# Patient Record
Sex: Male | Born: 1970 | Race: White | Hispanic: No | Marital: Single | Smoking: Current every day smoker
Health system: Southern US, Community
[De-identification: ages and names within clinical notes are randomized; demographics above are authoritative.]

## PROBLEM LIST (undated history)

## (undated) DIAGNOSIS — F329 Major depressive disorder, single episode, unspecified: Secondary | ICD-10-CM

## (undated) DIAGNOSIS — R748 Abnormal levels of other serum enzymes: Secondary | ICD-10-CM

## (undated) DIAGNOSIS — R569 Unspecified convulsions: Secondary | ICD-10-CM

## (undated) DIAGNOSIS — S73006A Unspecified dislocation of unspecified hip, initial encounter: Secondary | ICD-10-CM

## (undated) DIAGNOSIS — F32A Depression, unspecified: Secondary | ICD-10-CM

## (undated) DIAGNOSIS — F1010 Alcohol abuse, uncomplicated: Secondary | ICD-10-CM

## (undated) DIAGNOSIS — F419 Anxiety disorder, unspecified: Secondary | ICD-10-CM

## (undated) HISTORY — PX: FACIAL FRACTURE SURGERY: SHX1570

---

## 2000-07-17 ENCOUNTER — Inpatient Hospital Stay (HOSPITAL_COMMUNITY): Admission: AC | Admit: 2000-07-17 | Discharge: 2000-07-19 | Payer: Self-pay

## 2000-07-17 ENCOUNTER — Encounter: Payer: Self-pay | Admitting: Emergency Medicine

## 2002-07-19 ENCOUNTER — Emergency Department (HOSPITAL_COMMUNITY): Admission: AD | Admit: 2002-07-19 | Discharge: 2002-07-19 | Payer: Self-pay | Admitting: Emergency Medicine

## 2010-11-04 ENCOUNTER — Ambulatory Visit: Payer: Self-pay | Admitting: Internal Medicine

## 2010-11-04 DIAGNOSIS — Z0289 Encounter for other administrative examinations: Secondary | ICD-10-CM

## 2011-04-27 ENCOUNTER — Encounter (HOSPITAL_COMMUNITY): Payer: Self-pay | Admitting: *Deleted

## 2011-04-27 ENCOUNTER — Inpatient Hospital Stay (HOSPITAL_COMMUNITY)
Admission: EM | Admit: 2011-04-27 | Discharge: 2011-04-30 | DRG: 896 | Disposition: A | Discharge status: Home / Self Care | Payer: Self-pay | Attending: Internal Medicine | Admitting: Internal Medicine

## 2011-04-27 ENCOUNTER — Inpatient Hospital Stay (HOSPITAL_COMMUNITY): Payer: Self-pay

## 2011-04-27 DIAGNOSIS — R7401 Elevation of levels of liver transaminase levels: Secondary | ICD-10-CM | POA: Diagnosis present

## 2011-04-27 DIAGNOSIS — F10239 Alcohol dependence with withdrawal, unspecified: Principal | ICD-10-CM | POA: Diagnosis present

## 2011-04-27 DIAGNOSIS — T510X4A Toxic effect of ethanol, undetermined, initial encounter: Secondary | ICD-10-CM | POA: Diagnosis present

## 2011-04-27 DIAGNOSIS — G92 Toxic encephalopathy: Secondary | ICD-10-CM

## 2011-04-27 DIAGNOSIS — F29 Unspecified psychosis not due to a substance or known physiological condition: Secondary | ICD-10-CM | POA: Diagnosis present

## 2011-04-27 DIAGNOSIS — R4182 Altered mental status, unspecified: Secondary | ICD-10-CM | POA: Diagnosis present

## 2011-04-27 DIAGNOSIS — E871 Hypo-osmolality and hyponatremia: Secondary | ICD-10-CM | POA: Diagnosis present

## 2011-04-27 DIAGNOSIS — F1210 Cannabis abuse, uncomplicated: Secondary | ICD-10-CM | POA: Diagnosis present

## 2011-04-27 DIAGNOSIS — R7402 Elevation of levels of lactic acid dehydrogenase (LDH): Secondary | ICD-10-CM | POA: Diagnosis present

## 2011-04-27 DIAGNOSIS — D72829 Elevated white blood cell count, unspecified: Secondary | ICD-10-CM | POA: Diagnosis present

## 2011-04-27 DIAGNOSIS — F1020 Alcohol dependence, uncomplicated: Secondary | ICD-10-CM | POA: Diagnosis present

## 2011-04-27 DIAGNOSIS — D696 Thrombocytopenia, unspecified: Secondary | ICD-10-CM | POA: Diagnosis present

## 2011-04-27 DIAGNOSIS — R17 Unspecified jaundice: Secondary | ICD-10-CM

## 2011-04-27 DIAGNOSIS — T65891A Toxic effect of other specified substances, accidental (unintentional), initial encounter: Secondary | ICD-10-CM | POA: Diagnosis present

## 2011-04-27 DIAGNOSIS — F10939 Alcohol use, unspecified with withdrawal, unspecified: Principal | ICD-10-CM | POA: Diagnosis present

## 2011-04-27 DIAGNOSIS — F10231 Alcohol dependence with withdrawal delirium: Secondary | ICD-10-CM

## 2011-04-27 DIAGNOSIS — G929 Unspecified toxic encephalopathy: Secondary | ICD-10-CM | POA: Diagnosis present

## 2011-04-27 DIAGNOSIS — F17200 Nicotine dependence, unspecified, uncomplicated: Secondary | ICD-10-CM | POA: Diagnosis present

## 2011-04-27 HISTORY — DX: Unspecified convulsions: R56.9

## 2011-04-27 HISTORY — DX: Unspecified dislocation of unspecified hip, initial encounter: S73.006A

## 2011-04-27 HISTORY — DX: Depression, unspecified: F32.A

## 2011-04-27 HISTORY — DX: Alcohol abuse, uncomplicated: F10.10

## 2011-04-27 HISTORY — DX: Major depressive disorder, single episode, unspecified: F32.9

## 2011-04-27 HISTORY — DX: Anxiety disorder, unspecified: F41.9

## 2011-04-27 LAB — CBC
HCT: 40.1 % (ref 39.0–52.0)
MCHC: 35.4 g/dL (ref 30.0–36.0)
Platelets: 143 10*3/uL — ABNORMAL LOW (ref 150–400)
RDW: 12.6 % (ref 11.5–15.5)
WBC: 16.1 10*3/uL — ABNORMAL HIGH (ref 4.0–10.5)

## 2011-04-27 LAB — URINE MICROSCOPIC-ADD ON

## 2011-04-27 LAB — URINALYSIS, ROUTINE W REFLEX MICROSCOPIC
Glucose, UA: NEGATIVE mg/dL
Hgb urine dipstick: NEGATIVE
Ketones, ur: 15 mg/dL — AB
Protein, ur: 30 mg/dL — AB

## 2011-04-27 LAB — DIFFERENTIAL
Basophils Absolute: 0 10*3/uL (ref 0.0–0.1)
Eosinophils Absolute: 0 10*3/uL (ref 0.0–0.7)
Lymphocytes Relative: 8 % — ABNORMAL LOW (ref 12–46)
Monocytes Relative: 8 % (ref 3–12)
Neutro Abs: 13.5 10*3/uL — ABNORMAL HIGH (ref 1.7–7.7)

## 2011-04-27 LAB — COMPREHENSIVE METABOLIC PANEL
AST: 90 U/L — ABNORMAL HIGH (ref 0–37)
Albumin: 4.4 g/dL (ref 3.5–5.2)
Alkaline Phosphatase: 114 U/L (ref 39–117)
BUN: 9 mg/dL (ref 6–23)
Creatinine, Ser: 0.75 mg/dL (ref 0.50–1.35)
Potassium: 3.5 mEq/L (ref 3.5–5.1)
Total Protein: 8 g/dL (ref 6.0–8.3)

## 2011-04-27 LAB — RAPID URINE DRUG SCREEN, HOSP PERFORMED
Amphetamines: NOT DETECTED
Benzodiazepines: NOT DETECTED
Opiates: NOT DETECTED
Tetrahydrocannabinol: POSITIVE — AB

## 2011-04-27 LAB — ETHANOL: Alcohol, Ethyl (B): <11 mg/dL (ref 0–11)

## 2011-04-27 MED ORDER — LORAZEPAM 1 MG PO TABS: 1.0000 mg | ORAL_TABLET | Freq: Four times a day (QID) | ORAL | Status: DC | PRN

## 2011-04-27 MED ORDER — LORAZEPAM 2 MG/ML IJ SOLN
2.0000 mg | Freq: Once | INTRAMUSCULAR | Status: AC
  Administered 2011-04-27: 2 mg via INTRAMUSCULAR
  Filled 2011-04-27: qty 1

## 2011-04-27 MED ORDER — SODIUM CHLORIDE 0.9 % IJ SOLN
3.0000 mL | Freq: Two times a day (BID) | INTRAMUSCULAR | Status: DC
  Administered 2011-04-28 – 2011-04-30 (×5): 3 mL via INTRAVENOUS

## 2011-04-27 MED ORDER — THIAMINE HCL 100 MG/ML IJ SOLN
100.0000 mg | Freq: Every day | INTRAMUSCULAR | Status: DC
  Filled 2011-04-27 (×3): qty 1

## 2011-04-27 MED ORDER — ONDANSETRON HCL 4 MG/2ML IJ SOLN: 4.0000 mg | Freq: Four times a day (QID) | INTRAMUSCULAR | Status: DC | PRN

## 2011-04-27 MED ORDER — ZIPRASIDONE MESYLATE 20 MG IM SOLR
20.0000 mg | Freq: Once | INTRAMUSCULAR | Status: AC
  Administered 2011-04-27: 20 mg via INTRAMUSCULAR
  Filled 2011-04-27: qty 20

## 2011-04-27 MED ORDER — ADULT MULTIVITAMIN W/MINERALS CH
1.0000 | ORAL_TABLET | Freq: Every day | ORAL | Status: DC
  Administered 2011-04-28 – 2011-04-30 (×3): 1 via ORAL
  Filled 2011-04-27 (×3): qty 1

## 2011-04-27 MED ORDER — LORAZEPAM 2 MG/ML IJ SOLN
0.0000 mg | Freq: Four times a day (QID) | INTRAMUSCULAR | Status: AC
Start: 2011-04-27 — End: 2011-04-29
  Administered 2011-04-28: 2 mg via INTRAVENOUS
  Administered 2011-04-28: 1 mg via INTRAVENOUS
  Administered 2011-04-29: 2 mg via INTRAVENOUS
  Filled 2011-04-27 (×3): qty 1

## 2011-04-27 MED ORDER — ACETAMINOPHEN 325 MG PO TABS: 650.0000 mg | ORAL_TABLET | Freq: Four times a day (QID) | ORAL | Status: DC | PRN

## 2011-04-27 MED ORDER — THIAMINE HCL 100 MG/ML IJ SOLN
Freq: Once | INTRAVENOUS | Status: AC
  Administered 2011-04-27: 19:00:00 via INTRAVENOUS
  Filled 2011-04-27: qty 1000

## 2011-04-27 MED ORDER — LORAZEPAM 1 MG PO TABS: 2.0000 mg | ORAL_TABLET | Freq: Once | ORAL | Status: DC

## 2011-04-27 MED ORDER — HALOPERIDOL LACTATE 5 MG/ML IJ SOLN: 5.0000 mg | Freq: Four times a day (QID) | INTRAMUSCULAR | Status: DC | PRN

## 2011-04-27 MED ORDER — VITAMIN B-1 100 MG PO TABS
100.0000 mg | ORAL_TABLET | Freq: Every day | ORAL | Status: DC
  Administered 2011-04-28 – 2011-04-30 (×3): 100 mg via ORAL
  Filled 2011-04-27 (×3): qty 1

## 2011-04-27 MED ORDER — LORAZEPAM 2 MG/ML IJ SOLN
2.0000 mg | Freq: Once | INTRAMUSCULAR | Status: AC
  Administered 2011-04-27: 2 mg via INTRAVENOUS
  Filled 2011-04-27: qty 1

## 2011-04-27 MED ORDER — M.V.I. ADULT IV INJ
INJECTION | Freq: Once | INTRAVENOUS | Status: AC
  Administered 2011-04-27: 16:00:00 via INTRAVENOUS
  Filled 2011-04-27: qty 1000

## 2011-04-27 MED ORDER — ONDANSETRON HCL 4 MG PO TABS: 4.0000 mg | ORAL_TABLET | Freq: Four times a day (QID) | ORAL | Status: DC | PRN

## 2011-04-27 MED ORDER — ACETAMINOPHEN 650 MG RE SUPP: 650.0000 mg | Freq: Four times a day (QID) | RECTAL | Status: DC | PRN

## 2011-04-27 MED ORDER — LORAZEPAM 2 MG/ML IJ SOLN
0.0000 mg | Freq: Two times a day (BID) | INTRAMUSCULAR | Status: DC
  Administered 2011-04-30: 2 mg via INTRAVENOUS
  Filled 2011-04-27: qty 1

## 2011-04-27 MED ORDER — LORAZEPAM 2 MG/ML IJ SOLN
1.0000 mg | Freq: Four times a day (QID) | INTRAMUSCULAR | Status: DC | PRN
  Administered 2011-04-29: 1 mg via INTRAVENOUS
  Filled 2011-04-27: qty 1

## 2011-04-27 MED ORDER — FOLIC ACID 1 MG PO TABS
1.0000 mg | ORAL_TABLET | Freq: Every day | ORAL | Status: DC
  Administered 2011-04-28 – 2011-04-30 (×3): 1 mg via ORAL
  Filled 2011-04-27 (×3): qty 1

## 2011-04-27 NOTE — ED Notes (Signed)
Per EMS. Pt had last drink at 2AM. Drinks a fifth of liquor a day. Pt was found in a dumpster. Pt was altered initially and paranoid. Pt is now alert and oriented. Pt said he was "running from the police." Police on scene and stated there are no warrants for patient.

## 2011-04-27 NOTE — H&P (Signed)
Hospital Admission Note Date: 04/27/2011  Patient name: Jake [name] Medical record number: [id number] Date of birth: [birth year] Age: 41 y.o. Gender: male PCP: No primary provider on file.  Attending physician: Maryruth Bun Wake Conlee, MD Emergency Contact: Christel Mormon [phone] Code Status: Full  Chief Complaint: Altered mental status in the setting of known alcohol abuse  History of Present Illness: Jake [name] is an 41 y.o. male the past medical history of alcoholism who, according to the emergency department room physicians notes, was brought to the hospital by the police for evaluation of altered mental status. He reportedly drinks a fifth of liquor a day as well as a case of beer. He apparently was found in a dumpster. His last alcoholic drink was at 2 AM. While being evaluated in emergency department, he became increasingly belligerent and combative and has now been sedated with Geodon and is completely unable to wake up to answer any questions for me. Attempts to contact his emergency contact have been unsuccessful. He was referred for medical clearance due to his alcoholism and need for detoxification, hyponatremia, and leukocytosis.  Past Medical History Past Medical History  Diagnosis Date  . ETOH abuse   . Seizures   . Hip dislocation   . Depression   . Anxiety     Past Surgical History History reviewed. No pertinent past surgical history.  Meds: Prior to Admission medications   Not on File    Allergies: Review of patient's allergies indicates no known allergies.  Social History: History   Social History  . Marital Status: Married    Spouse Name: N/A    Number of Children: N/A  . Years of Education: N/A   Occupational History  . Not on file.   Social History Main Topics  . Smoking status: Current Everyday Smoker  . Smokeless tobacco: Never Used  . Alcohol Use: Yes  . Drug Use: Yes    Special: Marijuana  . Sexually Active:    Other Topics  Concern  . Not on file   Social History Narrative  . No narrative on file    Family History:  No family history on file.  Review of Systems: Unable to obtain a review of systems at this time.   Physical Exam: Blood pressure 137/77, pulse 118, temperature 98.3 F (36.8 C), temperature source Oral, resp. rate 20, SpO2 96.00%. BP 137/77  Pulse 118  Temp(Src) 98.3 F (36.8 C) (Oral)  Resp 20  SpO2 96%  General Appearance:    lethargic with snoring respirations   Head:    Normocephalic, without obvious abnormality, atraumatic  Eyes:    PERRL, conjunctiva/corneas clear, unable to assess extraocular movements      Ears:    Normal external ear canals, both ears  Nose:   Nares normal, septum midline, mucosa normal, no drainage    or sinus tenderness  Throat:   Would not open his mouth for me to examine his gums or teeth.  Neck:   Supple, symmetrical, trachea midline, no adenopathy;       thyroid:  No enlargement/tenderness/nodules; no carotid   bruit or JVD  Lungs:     Clear to auscultation bilaterally, respirations unlabored with snoring type respirations.  Chest wall:    No tenderness or deformity  Heart:    Regular rate and rhythm, S1 and S2 normal, no murmur, rub   or gallop  Abdomen:     Soft, non-tender, bowel sounds active all four quadrants,    no masses,  no organomegaly  Extremities:   Extremities normal, atraumatic, no cyanosis or edema  Pulses:   2+ and symmetric all extremities  Skin:   Skin color, texture, turgor normal, no rashes or lesions  Neurologic:   Not cooperative with neurological exam. Spontaneously moves all extremities.    Lab results: Basic Metabolic Panel:  Lab 04/27/11 1610  NA 127*  K 3.5  CL 86*  CO2 23  GLUCOSE 75  BUN 9  CREATININE 0.75  CALCIUM 9.8  MG --  PHOS --   GFR CrCl is unknown because there is no height on file for the current visit. Liver Function Tests:  Lab 04/27/11 1410  AST 90*  ALT 53  ALKPHOS 114  BILITOT 1.3*    PROT 8.0  ALBUMIN 4.4    CBC:  Lab 04/27/11 1410  WBC 16.1*  NEUTROABS 13.5*  HGB 14.2  HCT 40.1  MCV 93.5  PLT 143*   Imaging results:  No results found.  Assessment & Plan: Principal Problem:  *Toxic encephalopathy / Alcoholism / Psychosis  Altered mental status a combination of alcohol withdrawal, delirium, effects of Geodon.  High-risk for delirium tremens given the degree of alcohol consumption.  Check urine drug screen.  Admit to step down unit due to high-risk of seizures, delirium tremens, and other sequela of alcohol withdrawal.  Ativan IV per [facility] detox protocol.  Neuro checks every 4 hours.  May also need antipsychotics.  Psychiatry evaluation   Supplement thiamine and folic acid.  Active problems:  Leukocytosis  Likely demargination from stress response.  Check urinalysis  Check portable chest x-ray.  Hyponatremia  Likely due to beer drinker's potomania  Gently hydrate.  Thrombocytopenia  Likely secondary to the toxic effects of alcohol on the bone marrow and liver disease.  Mild with no evidence of bleeding.  Elevated AST (SGOT) / Elevated bilirubin  Likely from alcohol-induced hepatitis.  Will monitor.  Prophylaxis: PAS hoses.  Time Spent On Admission: 45 minutes  Leena Tiede 04/27/2011, 3:46 PM Pager [phone]

## 2011-04-27 NOTE — ED Notes (Signed)
Attempted to call report, but receiving RN was transferring a patient at the time and will call back.

## 2011-04-27 NOTE — ED Notes (Signed)
Pt continuously attempting to get out of bed, pulling at lines and trying to take clothes off. He is talking constant nonsense. He is not aggressive but does not follow instructions. At the present time, he is fairly easy to redirect, but this intervention may not hold up. Currently, 1 [employer], 1 tech and 3 security personnel present in room.

## 2011-04-27 NOTE — BH Assessment (Signed)
Assessment Note   Jake [name] is an 41 y.o. male who presents in [facility] Emergency Department with the chief complaint of alcohol abuse although patient exhibits severe psychotic features. During assessment patient was unable to coherently provide any information as to what events led him to the hospital for evaluation. Per MD initial contact note, patient reported that he was made to come to the ED by the police this morning for evaluation after he was caught fishing without a license. Patient exhibits pressured speech, tangential thinking, and inability to coherently respond to questions posed by Clinical research associate. Patient continued to state "I wanna take peanut butter...smear it on bottoms.Marland KitchenMarland KitchenChanetta Marshall get out of there! What are these things???" During initial conversation with MD, patient was observed to be actively hallucinating, speaking to a fire extinguisher that he says is a guy with mickey mouse ears on. Per chart, patient has a noted history of alcohol abuse and desire for detox. This writer recommends inpatient psychiatric treatment at this time due to active hallucinations and psychotic symptoms exhibited by patient.    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 ETOH abuse   . Seizures   . Hip dislocation   . Depression   . Anxiety    Axis IV: other psychosocial or environmental problems Axis V: 21-30 behavior considerably influenced by delusions or hallucinations OR serious impairment in judgment, communication OR inability to function in almost all areas  Past Medical History:  Past Medical History  Diagnosis Date  . ETOH abuse   . Seizures   . Hip dislocation   . Depression   . Anxiety     History reviewed. No pertinent past surgical history.  Family History: No family history on file.  Social History:  reports that he has been smoking.  He has never used smokeless tobacco. He reports that he drinks  alcohol. He reports that he uses illicit drugs (Marijuana).  Additional Social History:  Alcohol / Drug Use Pain Medications: Unknown  Prescriptions: Unknown Over the Counter: Unknown  History of alcohol / drug use?:  (Patient unable to provide SA history) Longest period of sobriety (when/how long): Unknown Allergies: No Known Allergies  Home Medications:  Medications Prior to Admission  Medication Dose Route Frequency Provider Last Rate Last Dose  . LORazepam (ATIVAN) injection 2 mg  2 mg Intramuscular Once Rise Patience, PA   2 mg at 04/27/11 1321  . LORazepam (ATIVAN) injection 2 mg  2 mg Intravenous Once Rise Patience, PA   2 mg at 04/27/11 1429  . ziprasidone (GEODON) injection 20 mg  20 mg Intramuscular Once Geoffery Lyons, MD   20 mg at 04/27/11 1502  . DISCONTD: LORazepam (ATIVAN) tablet 2 mg  2 mg Oral Once Rise Patience, PA       No current outpatient prescriptions on file as of 04/27/2011.    OB/GYN Status:  No LMP for male patient.  General Assessment Data Location of Assessment: [facility] ED Living Arrangements: Other (Comment) (Unknown ) Can pt return to current living arrangement?: Yes Admission Status: Voluntary Is patient capable of signing voluntary admission?: No Transfer from: Acute Hospital Referral Source: MD  Education Status Is patient currently in school?: No  Risk to self Suicidal Ideation: No Suicidal Intent: No Is patient at risk for suicide?: No Suicidal Plan?: No Access to Means: Yes Specify Access to Suicidal Means: Access to streets and objects What  has been your use of drugs/alcohol within the last 12 months?: Unknown  (Pt is unable to coherently provide SA information) Previous Attempts/Gestures:  (Unknown at this time) Other Self Harm Risks:  (Unknown at this time) Triggers for Past Attempts: Unknown Intentional Self Injurious Behavior: None Family Suicide History: Unable to assess Persecutory voices/beliefs?: No Depression: No Substance abuse  history and/or treatment for substance abuse?: Yes (Per chart)  Risk to Others Homicidal Ideation: No Thoughts of Harm to Others: No Current Homicidal Intent: No Current Homicidal Plan: No Access to Homicidal Means: No Identified Victim: N/A History of harm to others?: No (Unknown ) Assessment of Violence: None Noted Does patient have access to weapons?:  (Unknown at this time) Criminal Charges Pending?:  (Pt unable to coherently provide information ) Does patient have a court date:  (Pt unable to coherently provide information )  Psychosis Hallucinations: None noted (Not reported by pt) Delusions: Unspecified (Incoherent thoughts, tangetial speech, and psychotic feature)  Mental Status Report Appear/Hygiene: Disheveled Eye Contact: Poor Motor Activity: Freedom of movement;Agitation;Restlessness Speech: Incoherent;Pressured Level of Consciousness: Restless Mood: Anxious;Irritable Affect: Anxious;Apathetic;Inconsistent with thought content Anxiety Level: Minimal Thought Processes: Irrelevant;Tangential;Flight of Ideas Judgement: Impaired Orientation: Person;Place Obsessive Compulsive Thoughts/Behaviors: Minimal  Cognitive Functioning Concentration: Decreased Memory:  (Unable to assess) IQ: Average Insight: Poor Impulse Control: Poor Appetite:  (Unable to assess) Sleep:  (Unable to assess) Vegetative Symptoms: None  Prior Inpatient Therapy Prior Inpatient Therapy:  (Unknown )  Prior Outpatient Therapy Prior Outpatient Therapy:  (Unknown )  ADL Screening (condition at time of admission) Patient's cognitive ability adequate to safely complete daily activities?: Yes Patient able to express need for assistance with ADLs?: Yes Independently performs ADLs?: Yes Weakness of Arms/Hands: None  Home Assistive Devices/Equipment Home Assistive Devices/Equipment: None  Therapy Consults (therapy consults require a physician order) PT Evaluation Needed: No OT Evalulation  Needed: No SLP Evaluation Needed: No Abuse/Neglect Assessment (Assessment to be complete while patient is alone) Physical Abuse:  (Pt unable to coherently provide information) Verbal Abuse:  (Pt unable to coherently provide information) Sexual Abuse:  (Pt unable to coherently provide information) Values / Beliefs Cultural Requests During Hospitalization: None Spiritual Requests During Hospitalization: None Consults Spiritual Care Consult Needed: No Social Work Consult Needed: No      Additional Information 1:1 In Past 12 Months?: No CIRT Risk: No Elopement Risk: No Does patient have medical clearance?: Yes     Disposition:  Disposition Disposition of Patient: Inpatient treatment program Type of inpatient treatment program: Adult  On Site Evaluation by: Self   Reviewed with Physician:     Janann Colonel C 04/27/2011 3:09 PM

## 2011-04-27 NOTE — ED Notes (Signed)
Pt reports here for ETOH withdrawal. Pt reports drinking 16 beers  and  6 shots of liquor until 2AM. Pt usually drinks a case of beer and a 5th to 1/2 a gallon of liquor a day.  Pt also smokes marijuana with last use last night.  Pt reports he is withdrawing from ETOh because he does not have the money to buy any. Pt does not want detox.  Pt reports tremors, visual hallucinations.  Pt denies SI/HI.

## 2011-04-27 NOTE — ED Notes (Signed)
Patient moved to room 26 in TCU but his 2 personal belonging bags in locker 26 in [place].

## 2011-04-27 NOTE — ED Provider Notes (Signed)
History     CSN: [id number]  Arrival date & time 04/27/11  1137   First MD Initiated Contact with Patient 04/27/11 1233      Chief Complaint  Patient presents with  . Alcohol Problem    (Consider location/radiation/quality/duration/timing/severity/associated sxs/prior treatment) HPI Comments: Patient reports he was made to come to the ED by the police this morning.  States an Technical sales engineer was chasing him and wanted to talk to arrest him for fishing without a license but in the process suggested that the patient come here for detox.  Pt states that he came because they "made him" but he does not want detox right now, states he is too busy and has bills to pay and a court date.  State he drinks beer and liquor "whenever I'm awake" and last drank at 2 or 3am today. Reported to nurse that he drinks a case of beer and a fifth to a half gallon of liquor daily. States he is now feeling very shaky and he is starting to hallucinate, stating that there was a woman working in the hallway who "turned into" a tv personality.  Denies CP, SOB, abdominal pain, N/V, body aches.    The history is provided by the patient.    Past Medical History  Diagnosis Date  . ETOH abuse   . Seizures   . Hip dislocation   . Depression   . Anxiety     History reviewed. No pertinent past surgical history.  No family history on file.  History  Substance Use Topics  . Smoking status: Current Everyday Smoker  . Smokeless tobacco: Never Used  . Alcohol Use: Yes      Review of Systems  Constitutional: Negative for fever.  Respiratory: Negative for shortness of breath.   Cardiovascular: Negative for chest pain.  Gastrointestinal: Negative for nausea, vomiting, abdominal pain and diarrhea.  All other systems reviewed and are negative.    Allergies  Review of patient's allergies indicates no known allergies.  Home Medications  No current outpatient prescriptions on file.  BP 140/91  Pulse 118  Temp(Src)  98.3 F (36.8 C) (Oral)  Resp 20  SpO2 98%  Physical Exam  Nursing note and vitals reviewed. Constitutional: He is oriented to person, place, and time. He appears well-developed and well-nourished.  HENT:  Head: Normocephalic and atraumatic.  Neck: Neck supple.  Cardiovascular: Normal rate, regular rhythm and normal heart sounds.   Pulmonary/Chest: Breath sounds normal. No respiratory distress. He has no wheezes. He has no rales. He exhibits no tenderness.  Abdominal: Soft. Bowel sounds are normal. He exhibits no distension. There is no tenderness. There is no rebound and no guarding.  Neurological: He is alert and oriented to person, place, and time. He displays tremor.  Psychiatric: He has a normal mood and affect. His speech is normal. Judgment and thought content normal. He is actively hallucinating. Cognition and memory are normal.    ED Course  Procedures (including critical care time)  Labs Reviewed  CBC - Abnormal; Notable for the following:    WBC 16.1 (*)    Platelets 143 (*)    All other components within normal limits  DIFFERENTIAL - Abnormal; Notable for the following:    Neutrophils Relative 84 (*)    Lymphocytes Relative 8 (*)    Neutro Abs 13.5 (*)    Monocytes Absolute 1.3 (*)    All other components within normal limits  COMPREHENSIVE METABOLIC PANEL - Abnormal; Notable for the following:  Sodium 127 (*)    Chloride 86 (*)    AST 90 (*)    Total Bilirubin 1.3 (*)    All other components within normal limits  ETHANOL  URINE RAPID DRUG SCREEN (HOSP PERFORMED)  URINE RAPID DRUG SCREEN (HOSP PERFORMED)  URINALYSIS, ROUTINE W REFLEX MICROSCOPIC   No results found.  1:12 PM I spoke with Reita Cliche, ACT team, regarding resources available to the patient.  He suggests having patient go to [facility] when he is ready for detox.    2:04 PM Patient actively hallucinating, speaking to a fire extinguisher that he says is a guy with mickey mouse ears on.  Patient put on  seizure precautions, labs ordered, Reita Cliche made aware that patient will need medical clearance but will be staying.  Pt agrees.    3:13 PM Patient has been moved to [employer] [facility].  Per staff, patient is attempting to get out of bed, talking to dozens of people in the room who are not present.  Clearly still shaking. Pt states he feels fine.    3:19 PM Per Dr Judd Lien, he had been asked for a medication order address patient's attempt to address patient's current behavior and state - geodon given.    1. Alcohol withdrawal delirium   2. Hyponatremia       MDM  Heavy drinker presented to ED for unwanted detox, complained only of shaking and starting to have minor hallucinations on my initial exam, quickly deteriorated into more severe withdrawal with constant hallucinations and shaking.  Patient was placed on seizure precautions.  I have spoken with Dr Darnelle Catalan and have admitted patient to [facility], step down unit.  Bed request placed, Dr Darnelle Catalan to see patient in ED.          Rise Patience, [place] 04/27/11 1553

## 2011-04-27 NOTE — ED Notes (Signed)
Report called to receiving nurse

## 2011-04-27 NOTE — ED Notes (Signed)
Pt states that he is withdrawing from alcohol.  Has detoxed twice before.  Pt is having active hallucinations.  Talking to "Dr. Neil Crouch and Markus Jarvis".  Pt pointing to the fire extinguisher when asked where he sees them.  "They are gonna steal our jello!" Talking about "gay guys and niggers at a crazy party on [address] where they rubbed themselves in peanut butter and rolled in leaves".

## 2011-04-27 NOTE — ED Notes (Signed)
Pt loud and tying to get out of bed, restless and agitated. Pt will not relax and calm down, Dr Judd Lien aware of behavior, gave verbal order for Geodon 20mg , x 1 now.

## 2011-04-27 NOTE — ED Notes (Signed)
YNW:GNFA2<ZH> Expected date:<BR> Expected time:11:37 AM<BR> Means of arrival:Ambulance<BR> Comments:<BR> M61 -- ETOH Withdraws

## 2011-04-27 NOTE — ED Notes (Signed)
Pt is chewing at pulse ox and trying to get out of bed.

## 2011-04-28 DIAGNOSIS — F1210 Cannabis abuse, uncomplicated: Secondary | ICD-10-CM | POA: Diagnosis present

## 2011-04-28 DIAGNOSIS — F1020 Alcohol dependence, uncomplicated: Secondary | ICD-10-CM

## 2011-04-28 DIAGNOSIS — F10239 Alcohol dependence with withdrawal, unspecified: Principal | ICD-10-CM

## 2011-04-28 LAB — COMPREHENSIVE METABOLIC PANEL
ALT: 36 U/L (ref 0–53)
AST: 52 U/L — ABNORMAL HIGH (ref 0–37)
Albumin: 3.5 g/dL (ref 3.5–5.2)
Alkaline Phosphatase: 91 U/L (ref 39–117)
BUN: 10 mg/dL (ref 6–23)
CO2: 25 mEq/L (ref 19–32)
Calcium: 8.8 mg/dL (ref 8.4–10.5)
Chloride: 99 mEq/L (ref 96–112)
Creatinine, Ser: 0.72 mg/dL (ref 0.50–1.35)
GFR calc Af Amer: >90 mL/min (ref >90)
GFR calc non Af Amer: >90 mL/min (ref >90)
Glucose, Bld: 92 mg/dL (ref 70–99)
Potassium: 3.2 mEq/L — ABNORMAL LOW (ref 3.5–5.1)
Sodium: 135 mEq/L (ref 135–145)
Total Bilirubin: 1 mg/dL (ref 0.3–1.2)
Total Protein: 6.6 g/dL (ref 6.0–8.3)

## 2011-04-28 LAB — CBC
HCT: 37.9 % — ABNORMAL LOW (ref 39.0–52.0)
Hemoglobin: 13 g/dL (ref 13.0–17.0)
MCH: 32.3 pg (ref 26.0–34.0)
MCHC: 34.3 g/dL (ref 30.0–36.0)
MCV: 94.3 fL (ref 78.0–100.0)
Platelets: 144 10*3/uL — ABNORMAL LOW (ref 150–400)
RBC: 4.02 MIL/uL — ABNORMAL LOW (ref 4.22–5.81)
RDW: 12.5 % (ref 11.5–15.5)
WBC: 12.4 10*3/uL — ABNORMAL HIGH (ref 4.0–10.5)

## 2011-04-28 MED ORDER — PNEUMOCOCCAL VAC POLYVALENT 25 MCG/0.5ML IJ INJ
0.5000 mL | INJECTION | Freq: Once | INTRAMUSCULAR | Status: AC
  Administered 2011-04-28: 0.5 mL via INTRAMUSCULAR
  Filled 2011-04-28 (×2): qty 0.5

## 2011-04-28 MED ORDER — HYDROCODONE-ACETAMINOPHEN 5-325 MG PO TABS
1.0000 | ORAL_TABLET | ORAL | Status: DC | PRN
  Administered 2011-04-28: 2 via ORAL
  Administered 2011-04-28: 1 via ORAL
  Filled 2011-04-28 (×2): qty 2

## 2011-04-28 NOTE — Progress Notes (Signed)
Pt confirms he is self pay [place] resident CM and [facility], [facility] community liaison spoke with him briefly Regulatory affairs officer not accepted

## 2011-04-28 NOTE — Progress Notes (Signed)
Met with Pt, along with psych MD.  Pt states that he has been drinking 12-pack to a case of beer per day, along with a fifth to a gallon of Vodka per day.  He states that he's been drinking since he was 16 and that he's had to gradually increase his consumption due to tolerance.  Pt states that his mom left him when he was 16 and that he grew up in a home beside of his grandparents.  Pt is living on a trust left to him by his grandfather.  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  Pt states that he was approached by [employer] at him home for "false charges" related to DWI and property destruction.  Pt states that he locked the front door and ran out his sliding glass door.  He reported that he jumped the fence and hid in a dumpster.  Pt states that, once he was apprehended, the police convinced him to go to the ED.  Pt states that he intended to get tx, at some point, because he's "tired of the shakes."  Pt stated that he began to hallucinate yesterday due to DTs and he states that he feels like he has hallucinated in the past for the same reason.  Pt denies current SI, HI, AVH, paranoia, delusions.  Pt reported that the OD'd at 16 years.  Pt reported that he spent time at [facility]) years ago due to ETOH abuse.  Pt reports that he smokes marijuana approx 2-3x per wk.  Pt has never been married and has no children.  When asked about being thin, Pt reported that he doesn't eat, rather he drinks.  Pt is not on any psychotropic medication.  Pt states that he may be ready for tx.  He has been to AA in the past but didn't have a sponsor.  Pt gave CSW permission to contact his sister-in-law, Alvis Lemmings, at [phone].  CSW thanked Pt for his time.  CSW to continue to follow.  Providence Crosby, LCSWA Clinical Social Work [phone]

## 2011-04-28 NOTE — Progress Notes (Signed)
CARE MANAGEMENT NOTE 04/28/2011  Patient:  Jake [name]   Account Number:  [id number]  Date Initiated:  04/28/2011  Documentation initiated by:  Labron Bloodgood  Subjective/Objective Assessment:   pt brought in by gpd intoxication, hx of same and seizures,     Action/Plan:   psych consult, doubt is patient will agree to assitance has a court date on [phone] in [place].   Anticipated DC Date:  05/01/2011   Anticipated DC Plan:  HOME/SELF CARE  In-house referral  Clinical Social Worker      DC Planning Services  Outpatient Services - Pt will follow up      [facility] Choice  NA   Choice offered to / List presented to:  NA   DME arranged  NA      DME agency  NA     HH arranged  NA      HH agency  NA   Status of service:  In process, will continue to follow Medicare Important Message given?  NA - LOS <3 / Initial given by admissions (If response is "NO", the following Medicare IM given date fields will be blank) Date Medicare IM given:   Date Additional Medicare IM given:    Discharge Disposition:    Per UR Regulation:  Reviewed for med. necessity/level of care/duration of stay  If discussed at Long Length of Stay Meetings, dates discussed:    Comments:  [id number]/Tessica Cupo,RN,BSN,CCM

## 2011-04-28 NOTE — Progress Notes (Signed)
Attempted to meet with Pt.  CSW unable to awaken Pt.  Spoke with CSW, Remonia Richter, who met with Pt earlier this morning.  Per Remonia Richter, Pt is amenable to inpt tx as long as he can be available to attend court next week.  CSW to continue to follow.  Providence Crosby, LCSWA Clinical Social Work [phone]

## 2011-04-28 NOTE — Consult Note (Signed)
Reason for Consult:Psychosis, Detox from Alcohol Referring Physician: Dr. Priscille Heidelberg is an 41 y.o. male.  HPI: Patient reports he was made to come to the ED by the police this morning. States an Technical sales engineer was chasing him and wanted to talk to arrest him for fishing without a license but in the process suggested that the patient come here for detox. Pt states that he came because they "made him" but he does not want detox right now, states he is too busy and has bills to pay and a court date. State he drinks beer and liquor "whenever I'm awake" and last drank at 2 or 3am today. Reported to nurse that he drinks a case of beer and a fifth to a half gallon of vodka daily. States he is now feeling very shaky and he is starting to hallucinate, stating that there was a woman working in the hallway who "turned into" a tv personality. Denies CP, SOB, abdominal pain, N/V, body aches.   AXIS I Alcohol Dependence  In Detox with DTs, Cannabis abuse AXIS II Deferred AXIS III Past Medical History  Diagnosis Date  . ETOH abuse   . Seizures   . Hip dislocation   . Depression   . Anxiety     History reviewed. No pertinent past surgical history. AXIS IV  Hx of parental abandonment, poor impulse control, legal issues AXIS V  GAF 46   No family history on file.  Social History:  reports that he has been smoking.  He has never used smokeless tobacco. He reports that he drinks alcohol. He reports that he uses illicit drugs (Marijuana).  Allergies: No Known Allergies  Medications: I have reviewed the patient's current medications.  Results for orders placed during the hospital encounter of 04/27/11 (from the past 48 hour(s))  CBC     Status: Abnormal   Collection Time   04/27/11  2:10 PM      Component Value Range Comment   WBC 16.1 (*) 4.0 - 10.5 (K/uL)    RBC 4.29  4.22 - 5.81 (MIL/uL)    Hemoglobin 14.2  13.0 - 17.0 (g/dL)    HCT 46.9  62.9 - 52.8 (%)    MCV 93.5  78.0 - 100.0 (fL)    MCH 33.1  26.0 - 34.0 (pg)    MCHC 35.4  30.0 - 36.0 (g/dL)    RDW 41.3  24.4 - 01.0 (%)    Platelets 143 (*) 150 - 400 (K/uL)   DIFFERENTIAL     Status: Abnormal   Collection Time   04/27/11  2:10 PM      Component Value Range Comment   Neutrophils Relative 84 (*) 43 - 77 (%)    Lymphocytes Relative 8 (*) 12 - 46 (%)    Monocytes Relative 8  3 - 12 (%)    Eosinophils Relative 0  0 - 5 (%)    Basophils Relative 0  0 - 1 (%)    Neutro Abs 13.5 (*) 1.7 - 7.7 (K/uL)    Lymphs Abs 1.3  0.7 - 4.0 (K/uL)    Monocytes Absolute 1.3 (*) 0.1 - 1.0 (K/uL)    Eosinophils Absolute 0.0  0.0 - 0.7 (K/uL)    Basophils Absolute 0.0  0.0 - 0.1 (K/uL)    Smear Review MORPHOLOGY UNREMARKABLE     COMPREHENSIVE METABOLIC PANEL     Status: Abnormal   Collection Time   04/27/11  2:10 PM  Component Value Range Comment   Sodium 127 (*) 135 - 145 (mEq/L)    Potassium 3.5  3.5 - 5.1 (mEq/L)    Chloride 86 (*) 96 - 112 (mEq/L)    CO2 23  19 - 32 (mEq/L)    Glucose, Bld 75  70 - 99 (mg/dL)    BUN 9  6 - 23 (mg/dL)    Creatinine, Ser 1.30  0.50 - 1.35 (mg/dL)    Calcium 9.8  8.4 - 10.5 (mg/dL)    Total Protein 8.0  6.0 - 8.3 (g/dL)    Albumin 4.4  3.5 - 5.2 (g/dL)    AST 90 (*) 0 - 37 (U/L)    ALT 53  0 - 53 (U/L)    Alkaline Phosphatase 114  39 - 117 (U/L)    Total Bilirubin 1.3 (*) 0.3 - 1.2 (mg/dL)    GFR calc non Af Amer >90  >90 (mL/min)    GFR calc Af Amer >90  >90 (mL/min)   ETHANOL     Status: Normal   Collection Time   04/27/11  2:10 PM      Component Value Range Comment   Alcohol, Ethyl (B) <11  0 - 11 (mg/dL)   URINE RAPID DRUG SCREEN (HOSP PERFORMED)     Status: Abnormal   Collection Time   04/27/11  5:08 PM      Component Value Range Comment   Opiates NONE DETECTED  NONE DETECTED     Cocaine NONE DETECTED  NONE DETECTED     Benzodiazepines NONE DETECTED  NONE DETECTED     Amphetamines NONE DETECTED  NONE DETECTED     Tetrahydrocannabinol POSITIVE (*) NONE DETECTED     Barbiturates  NONE DETECTED  NONE DETECTED    URINALYSIS, ROUTINE W REFLEX MICROSCOPIC     Status: Abnormal   Collection Time   04/27/11  5:09 PM      Component Value Range Comment   Color, Urine AMBER (*) YELLOW  BIOCHEMICALS MAY BE AFFECTED BY COLOR   APPearance CLEAR  CLEAR     Specific Gravity, Urine 1.014  1.005 - 1.030     pH 6.0  5.0 - 8.0     Glucose, UA NEGATIVE  NEGATIVE (mg/dL)    Hgb urine dipstick NEGATIVE  NEGATIVE     Bilirubin Urine NEGATIVE  NEGATIVE     Ketones, ur 15 (*) NEGATIVE (mg/dL)    Protein, ur 30 (*) NEGATIVE (mg/dL)    Urobilinogen, UA 1.0  0.0 - 1.0 (mg/dL)    Nitrite NEGATIVE  NEGATIVE     Leukocytes, UA NEGATIVE  NEGATIVE    URINE MICROSCOPIC-ADD ON     Status: Abnormal   Collection Time   04/27/11  5:09 PM      Component Value Range Comment   Squamous Epithelial / LPF RARE  RARE     WBC, UA 0-2  <3 (WBC/hpf)    Bacteria, UA FEW (*) RARE     Casts HYALINE CASTS (*) NEGATIVE     Urine-Other MUCOUS PRESENT     MRSA PCR SCREENING     Status: Normal   Collection Time   04/27/11  5:45 PM      Component Value Range Comment   MRSA by PCR NEGATIVE  NEGATIVE    COMPREHENSIVE METABOLIC PANEL     Status: Abnormal   Collection Time   04/28/11  7:55 AM      Component Value Range Comment   Sodium 135  135 - 145 (mEq/L)    Potassium 3.2 (*) 3.5 - 5.1 (mEq/L)    Chloride 99  96 - 112 (mEq/L)    CO2 25  19 - 32 (mEq/L)    Glucose, Bld 92  70 - 99 (mg/dL)    BUN 10  6 - 23 (mg/dL)    Creatinine, Ser 4.09  0.50 - 1.35 (mg/dL)    Calcium 8.8  8.4 - 10.5 (mg/dL)    Total Protein 6.6  6.0 - 8.3 (g/dL)    Albumin 3.5  3.5 - 5.2 (g/dL)    AST 52 (*) 0 - 37 (U/L)    ALT 36  0 - 53 (U/L)    Alkaline Phosphatase 91  39 - 117 (U/L)    Total Bilirubin 1.0  0.3 - 1.2 (mg/dL)    GFR calc non Af Amer >90  >90 (mL/min)    GFR calc Af Amer >90  >90 (mL/min)   CBC     Status: Abnormal   Collection Time   04/28/11  7:55 AM      Component Value Range Comment   WBC 12.4 (*) 4.0 - 10.5  (K/uL)    RBC 4.02 (*) 4.22 - 5.81 (MIL/uL)    Hemoglobin 13.0  13.0 - 17.0 (g/dL)    HCT 81.1 (*) 91.4 - 52.0 (%)    MCV 94.3  78.0 - 100.0 (fL)    MCH 32.3  26.0 - 34.0 (pg)    MCHC 34.3  30.0 - 36.0 (g/dL)    RDW 78.2  95.6 - 21.3 (%)    Platelets 144 (*) 150 - 400 (K/uL)     Portable Chest 1 View  04/28/2011  *RADIOLOGY REPORT*  Clinical Data: Leukocytosis; assess for aspiration pneumonia.  PORTABLE CHEST - 1 VIEW  Comparison: None.  Findings: The lungs are well-aerated and clear.  There is no evidence of focal opacification, pleural effusion or pneumothorax.  The heart is normal in size; the mediastinal contour is within normal limits.  Evaluation is mildly suboptimal due to patient rotation.  No acute osseous abnormalities are seen.  IMPRESSION: No acute cardiopulmonary process seen; no definite evidence for aspiration.  Original Report Authenticated By: Tonia Ghent, M.D.    Review of Systems  Unable to perform ROS  Blood pressure 134/80, pulse 72, temperature 98.9 F (37.2 C), temperature source Oral, resp. rate 16, height 5\' 10"  (1.778 m), weight 56.4 kg (124 lb 5.4 oz), SpO2 96.00%. Physical Exam  Assessment/Plan: Chart reviewed, Discussed with Psych CSW, Interviewed pt with CSW Pt is sitting up in bed.  He has intermittent eye contact.  He is oriented to person place and situation.  He denies SIHI but but says he took an Overdose at age 3.  This was the age his mother abandoned him  He lived in a house next to his grandparents.  When his grandfather died, he was given a trust that provides finances for him. He pays his own bills; has no family and no children.  He has been drinking alcohol since age 55  Beer - gradually increased to 1 case/day and Vodka 1 gallon/day.  He denies other drug use except occasional cannabis/week.  He has had several DUI and license revoked.  He now lives w/in walking distance of stores; drives only to market and denies any recent DUI.  He has some  insight and appreciation that he does not like the DTs. He agrees with improved judgment to seek rehab programs.   He  no longer has any AH/VH.that he was experiencing last night. RECOMMENDATION 1. Pt has capacity to determine his treatment 2. Encourage pt to transfer to rehab program when medically stable.  3. Will continue to evaluation MS and mood.    Charlcie Prisco 04/28/2011, 4:50 PM

## 2011-04-28 NOTE — Progress Notes (Signed)
PROGRESS NOTE  Jake [name] EAV:[id number] DOB: 09/28/1970 DOA: 04/27/2011 PCP: No primary provider on file.  Brief narrative: Jake [name] is an 41 y.o. male the past medical history of alcoholism who, according to the emergency department room physicians notes, was brought to the hospital by the police for evaluation of altered mental status.  He was admitted to the SDU with delirium.   Assessment/Plan: Principal Problem:  *Toxic encephalopathy / Alcoholism / Marijuana Abuse/ Psychosis  Altered mental status a combination of alcohol withdrawal, delirium, effects of Geodon.  High-risk for delirium tremens given the degree of alcohol consumption.  UDS + THC Admitted to step down unit due to high-risk of seizures, delirium tremens, and other sequela of alcohol withdrawal, now safe to transfer to med-surg bed.  Ativan IV per [facility] detox protocol.  Neuro checks every 4 hours.  May also need antipsychotics PRN, but none needed over night..  Psychiatry evaluation pending. Supplementing thiamine and folic acid. Active problems:  Leukocytosis  Likely demargination from stress response.  Urinalysis with a few bacteria, no significant WBCs. Portable chest x-ray clear. Hyponatremia  Likely due to beer drinker's potomania  Gently hydrated. Thrombocytopenia  Likely secondary to the toxic effects of alcohol on the bone marrow and liver disease.  Mild with no evidence of bleeding. Elevated AST (SGOT) / Elevated bilirubin  Likely from alcohol-induced hepatitis.  Will monitor.   Code Status: Full Family Communication: None at bedside. Disposition Plan: Home versus rehab for alcoholism, when stable.  Consultants:  Psychiatry consult pending.  Antibiotics:  None   Subjective  Mr. Jake [name] is awake and oriented this morning.  He denies dyspnea, cough.  He reports some knee pain and tremulousness.   Objective    Interim History: Hemodynamically stable overnight.  Nurses report  that he has been cooperative and oriented.   Objective: Filed Vitals:   04/27/11 2200 04/27/11 2300 04/28/11 0000 04/28/11 0400  BP: 108/68 124/66 130/71 126/74  Pulse: 87 89 90 81  Temp:   98.9 F (37.2 C) 98.1 F (36.7 C)  TempSrc:   Axillary Axillary  Resp:   17 18  Height:      Weight:   56.4 kg (124 lb 5.4 oz)   SpO2:   98% 96%    Intake/Output Summary (Last 24 hours) at 04/28/11 0721 Last data filed at 04/28/11 0600  Gross per 24 hour  Intake   1800 ml  Output    950 ml  Net    850 ml    Exam: Gen:  NAD Cardiovascular:  RRR, No M/R/G Respiratory: Lungs CTAB Gastrointestinal: Abdomen soft, NT/ND with normal active bowel sounds. Extremities: No C/E/C  Data Reviewed: Basic Metabolic Panel:  Lab 04/27/11 7829  NA 127*  K 3.5  CL 86*  CO2 23  GLUCOSE 75  BUN 9  CREATININE 0.75  CALCIUM 9.8  MG --  PHOS --   GFR Estimated Creatinine Clearance: 97.9 ml/min (by C-G formula based on Cr of 0.75). Liver Function Tests:  Lab 04/27/11 1410  AST 90*  ALT 53  ALKPHOS 114  BILITOT 1.3*  PROT 8.0  ALBUMIN 4.4    CBC:  Lab 04/27/11 1410  WBC 16.1*  NEUTROABS 13.5*  HGB 14.2  HCT 40.1  MCV 93.5  PLT 143*   Microbiology Recent Results (from the past 240 hour(s))  MRSA PCR SCREENING     Status: Normal   Collection Time   04/27/11  5:45 PM      Component  Value Range Status Comment   MRSA by PCR NEGATIVE  NEGATIVE  Final     Procedures and Diagnostic Studies: Portable Chest 1 View  04/28/2011  *RADIOLOGY REPORT*  Clinical Data: Leukocytosis; assess for aspiration pneumonia.  PORTABLE CHEST - 1 VIEW  Comparison: None.  Findings: The lungs are well-aerated and clear.  There is no evidence of focal opacification, pleural effusion or pneumothorax.  The heart is normal in size; the mediastinal contour is within normal limits.  Evaluation is mildly suboptimal due to patient rotation.  No acute osseous abnormalities are seen.  IMPRESSION: No acute  cardiopulmonary process seen; no definite evidence for aspiration.  Original Report Authenticated By: Tonia Ghent, M.D.    Scheduled Meds:   . folic acid  1 mg Oral Daily  . LORazepam  0-4 mg Intravenous Q6H   Followed by  . LORazepam  0-4 mg Intravenous Q12H  . LORazepam  2 mg Intramuscular Once  . LORazepam  2 mg Intravenous Once  . mulitivitamin with minerals  1 tablet Oral Daily  . pneumococcal 23 valent vaccine  0.5 mL Intramuscular Once  . banana bag IV fluid 1000 mL   Intravenous Once  . general admission iv infusion   Intravenous Once  . sodium chloride  3 mL Intravenous Q12H  . thiamine  100 mg Oral Daily   Or  . thiamine  100 mg Intravenous Daily  . ziprasidone  20 mg Intramuscular Once  . DISCONTD: LORazepam  2 mg Oral Once   Continuous Infusions:     LOS: 1 day   Hillery Aldo, MD Pager [phone]  04/28/2011, 7:21 AM   Patient Teaching (information given to and reviewed with the patient):  KOLIN ERDAHL 04/28/2011 Hospitalist: Dr. Trula Ore Dyani Babel  Medical Issues and plan:

## 2011-04-28 NOTE — Progress Notes (Signed)
Attempted to call report to 5E, RN unable to take report due to being at MRI with another pt.

## 2011-04-28 NOTE — Progress Notes (Signed)
CSW met with Pt to assess needs due to ETOH abuse. Pt very open to meeting with CSW and answered all questions. Pt lives alone in an apartment. He had a girlfriend, but they have since broken up. He reports GF drinks more than he does. Pt admits to drinking a 12 pack a day and if liquor he drinks a fifth a day. 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 Pt reports his drinking got really bad last year after his grandfather's death. Pt interested in inpt tx, but has pending court date. Pt reports he was at [facility] 6 yrs ago for alcohol per his report. SBIRT completed and Pt scored a 31, high risk. Pt was asleep when CSW went to discuss results of SBIRT. Pt aware psych to follow up. CSW discussed case with psych csw as well.  Vennie Homans, [place] 04/28/2011 12:11 PM [phone]

## 2011-04-29 MED ORDER — ADULT MULTIVITAMIN W/MINERALS CH: 1.0000 | ORAL_TABLET | Freq: Every day | ORAL | Status: DC

## 2011-04-29 MED ORDER — NICOTINE 21 MG/24HR TD PT24
21.0000 mg | MEDICATED_PATCH | Freq: Every day | TRANSDERMAL | Status: DC
  Administered 2011-04-29 – 2011-04-30 (×2): 21 mg via TRANSDERMAL
  Filled 2011-04-29 (×2): qty 1

## 2011-04-29 MED ORDER — POTASSIUM CHLORIDE CRYS ER 20 MEQ PO TBCR
40.0000 meq | EXTENDED_RELEASE_TABLET | Freq: Once | ORAL | Status: AC
  Administered 2011-04-29: 40 meq via ORAL
  Filled 2011-04-29: qty 2

## 2011-04-29 NOTE — ED Provider Notes (Signed)
Medical screening examination/treatment/procedure(s) were performed by non-physician practitioner and as supervising physician I was immediately available for consultation/collaboration.   Rodrecus Belsky, MD 04/29/11 0849 

## 2011-04-29 NOTE — Progress Notes (Signed)
Met with Pt to discuss d/c plans, as, per MD, Pt ready for d/c.  Pt stated that he is surprised that he's d/c'ing today because he's still shakey.  He states, however, that he's ready to go home and declines assistance from CSW to seek outpt or inpt tx.  He further declines AA information, stating that he already has this information.    CSW provided Pt with outpt and inpt tx list and discussed [facility]'s inpt procedures, should Pt become interested in the future.    CSW thanked Pt for his time.  Notified MD that Pt was given tx resources.  CSW asked MD if Pt had finished his detox program.  Per MD, Pt is not finished but is ok to d/c.  MD stating that Pt made it clear that he does not intend to stop drinking, thus MD doesn't feel that Pt needs to remain in the hospital.  MD stated, however, that she is happy to assist Pt with continued detox if he is committed to not drinking and seeking help.  Notified Pt of MD's willingness to keep Pt for further detox.  Pt stated that he didn't feel that this was necessary and stated that he was ready to go home.  CSW thanked Pt for his time.  Per RN, Pt changed his mind and would like to remain in the hospital for continued detox.  RN to notify MD.  Sherron Monday with Pt.  Pt stated that he'd like to remain in the hospital another night to finish his detox.  CSW offered to schedule an appt for Pt at [facility] to begin the inpt process.  Pt declined, at this time, and asked CSW to visit him in the morning to discuss [facility] further.  CSW to continue to follow.  Providence Crosby, LCSWA Clinical Social Work [phone]

## 2011-04-29 NOTE — Consult Note (Signed)
Reason for Consult:Alcohol Detox Referring Physician: dr. Priscille Heidelberg is an 41 y.o. male.  HPI: Patient reports he was made to come to the ED by the police this morning. States an Technical sales engineer was chasing him and wanted to talk to arrest him for fishing without a license but in the process suggested that the patient come here for detox. Pt states that he came because they "made him" but he does not want detox right now, states he is too busy and has bills to pay and a court date. State he drinks beer and liquor "whenever I'm awake" and last drank at 2 or 3am today. Reported to nurse that he drinks a case of beer and a fifth to a half gallon of vodka daily. States he is now feeling very shaky and he is starting to hallucinate, stating that there was a woman working in the hallway who "turned into" a tv personality. Denies CP, SOB, abdominal pain, N/V, body aches.   AXIS I Alcohol Dependence In Detox with DTs, Cannabis abuse  AXIS II Deferred  AXIS III Past Medical History  Diagnosis Date  . ETOH abuse   . Seizures   . Hip dislocation   . Depression   . Anxiety     History reviewed. No pertinent past surgical history. AXIS IV Hx abandonment of parental care, poor impulse control, financially independent  AXIS IV 46  No family history on file.  Social History:  reports that he has been smoking.  He has never used smokeless tobacco. He reports that he drinks alcohol. He reports that he uses illicit drugs (Marijuana).  Allergies: No Known Allergies  Medications: I have reviewed the patient's current medications.  Results for orders placed during the hospital encounter of 04/27/11 (from the past 48 hour(s))  CBC     Status: Abnormal   Collection Time   04/27/11  2:10 PM      Component Value Range Comment   WBC 16.1 (*) 4.0 - 10.5 (K/uL)    RBC 4.29  4.22 - 5.81 (MIL/uL)    Hemoglobin 14.2  13.0 - 17.0 (g/dL)    HCT 96.2  95.2 - 84.1 (%)    MCV 93.5  78.0 - 100.0 (fL)    MCH  33.1  26.0 - 34.0 (pg)    MCHC 35.4  30.0 - 36.0 (g/dL)    RDW 32.4  40.1 - 02.7 (%)    Platelets 143 (*) 150 - 400 (K/uL)   DIFFERENTIAL     Status: Abnormal   Collection Time   04/27/11  2:10 PM      Component Value Range Comment   Neutrophils Relative 84 (*) 43 - 77 (%)    Lymphocytes Relative 8 (*) 12 - 46 (%)    Monocytes Relative 8  3 - 12 (%)    Eosinophils Relative 0  0 - 5 (%)    Basophils Relative 0  0 - 1 (%)    Neutro Abs 13.5 (*) 1.7 - 7.7 (K/uL)    Lymphs Abs 1.3  0.7 - 4.0 (K/uL)    Monocytes Absolute 1.3 (*) 0.1 - 1.0 (K/uL)    Eosinophils Absolute 0.0  0.0 - 0.7 (K/uL)    Basophils Absolute 0.0  0.0 - 0.1 (K/uL)    Smear Review MORPHOLOGY UNREMARKABLE     COMPREHENSIVE METABOLIC PANEL     Status: Abnormal   Collection Time   04/27/11  2:10 PM      Component Value  Range Comment   Sodium 127 (*) 135 - 145 (mEq/L)    Potassium 3.5  3.5 - 5.1 (mEq/L)    Chloride 86 (*) 96 - 112 (mEq/L)    CO2 23  19 - 32 (mEq/L)    Glucose, Bld 75  70 - 99 (mg/dL)    BUN 9  6 - 23 (mg/dL)    Creatinine, Ser 1.61  0.50 - 1.35 (mg/dL)    Calcium 9.8  8.4 - 10.5 (mg/dL)    Total Protein 8.0  6.0 - 8.3 (g/dL)    Albumin 4.4  3.5 - 5.2 (g/dL)    AST 90 (*) 0 - 37 (U/L)    ALT 53  0 - 53 (U/L)    Alkaline Phosphatase 114  39 - 117 (U/L)    Total Bilirubin 1.3 (*) 0.3 - 1.2 (mg/dL)    GFR calc non Af Amer >90  >90 (mL/min)    GFR calc Af Amer >90  >90 (mL/min)   ETHANOL     Status: Normal   Collection Time   04/27/11  2:10 PM      Component Value Range Comment   Alcohol, Ethyl (B) <11  0 - 11 (mg/dL)   URINE RAPID DRUG SCREEN (HOSP PERFORMED)     Status: Abnormal   Collection Time   04/27/11  5:08 PM      Component Value Range Comment   Opiates NONE DETECTED  NONE DETECTED     Cocaine NONE DETECTED  NONE DETECTED     Benzodiazepines NONE DETECTED  NONE DETECTED     Amphetamines NONE DETECTED  NONE DETECTED     Tetrahydrocannabinol POSITIVE (*) NONE DETECTED     Barbiturates NONE  DETECTED  NONE DETECTED    URINALYSIS, ROUTINE W REFLEX MICROSCOPIC     Status: Abnormal   Collection Time   04/27/11  5:09 PM      Component Value Range Comment   Color, Urine AMBER (*) YELLOW  BIOCHEMICALS MAY BE AFFECTED BY COLOR   APPearance CLEAR  CLEAR     Specific Gravity, Urine 1.014  1.005 - 1.030     pH 6.0  5.0 - 8.0     Glucose, UA NEGATIVE  NEGATIVE (mg/dL)    Hgb urine dipstick NEGATIVE  NEGATIVE     Bilirubin Urine NEGATIVE  NEGATIVE     Ketones, ur 15 (*) NEGATIVE (mg/dL)    Protein, ur 30 (*) NEGATIVE (mg/dL)    Urobilinogen, UA 1.0  0.0 - 1.0 (mg/dL)    Nitrite NEGATIVE  NEGATIVE     Leukocytes, UA NEGATIVE  NEGATIVE    URINE MICROSCOPIC-ADD ON     Status: Abnormal   Collection Time   04/27/11  5:09 PM      Component Value Range Comment   Squamous Epithelial / LPF RARE  RARE     WBC, UA 0-2  <3 (WBC/hpf)    Bacteria, UA FEW (*) RARE     Casts HYALINE CASTS (*) NEGATIVE     Urine-Other MUCOUS PRESENT     MRSA PCR SCREENING     Status: Normal   Collection Time   04/27/11  5:45 PM      Component Value Range Comment   MRSA by PCR NEGATIVE  NEGATIVE    COMPREHENSIVE METABOLIC PANEL     Status: Abnormal   Collection Time   04/28/11  7:55 AM      Component Value Range Comment   Sodium 135  135 -  145 (mEq/L)    Potassium 3.2 (*) 3.5 - 5.1 (mEq/L)    Chloride 99  96 - 112 (mEq/L)    CO2 25  19 - 32 (mEq/L)    Glucose, Bld 92  70 - 99 (mg/dL)    BUN 10  6 - 23 (mg/dL)    Creatinine, Ser 1.61  0.50 - 1.35 (mg/dL)    Calcium 8.8  8.4 - 10.5 (mg/dL)    Total Protein 6.6  6.0 - 8.3 (g/dL)    Albumin 3.5  3.5 - 5.2 (g/dL)    AST 52 (*) 0 - 37 (U/L)    ALT 36  0 - 53 (U/L)    Alkaline Phosphatase 91  39 - 117 (U/L)    Total Bilirubin 1.0  0.3 - 1.2 (mg/dL)    GFR calc non Af Amer >90  >90 (mL/min)    GFR calc Af Amer >90  >90 (mL/min)   CBC     Status: Abnormal   Collection Time   04/28/11  7:55 AM      Component Value Range Comment   WBC 12.4 (*) 4.0 - 10.5 (K/uL)     RBC 4.02 (*) 4.22 - 5.81 (MIL/uL)    Hemoglobin 13.0  13.0 - 17.0 (g/dL)    HCT 09.6 (*) 04.5 - 52.0 (%)    MCV 94.3  78.0 - 100.0 (fL)    MCH 32.3  26.0 - 34.0 (pg)    MCHC 34.3  30.0 - 36.0 (g/dL)    RDW 40.9  81.1 - 91.4 (%)    Platelets 144 (*) 150 - 400 (K/uL)     Portable Chest 1 View  04/28/2011  *RADIOLOGY REPORT*  Clinical Data: Leukocytosis; assess for aspiration pneumonia.  PORTABLE CHEST - 1 VIEW  Comparison: None.  Findings: The lungs are well-aerated and clear.  There is no evidence of focal opacification, pleural effusion or pneumothorax.  The heart is normal in size; the mediastinal contour is within normal limits.  Evaluation is mildly suboptimal due to patient rotation.  No acute osseous abnormalities are seen.  IMPRESSION: No acute cardiopulmonary process seen; no definite evidence for aspiration.  Original Report Authenticated By: Tonia Ghent, M.D.    Review of Systems  Unable to perform ROS: other  Neurological: Positive for weakness.   Blood pressure 139/91, pulse 61, temperature 98.7 F (37.1 C), temperature source Oral, resp. rate 18, height 5\' 10"  (1.778 m), weight 56.4 kg (124 lb 5.4 oz), SpO2 98.00%. Physical Exam  Assessment/Plan: Chart reviewed,  Pt is interviewed this am.  He is initially asleep.  He says he has been taking Ativan that makes him sleepy.  He says he has wakened about every 3 hrs during the night.  He denies SI/HI/AH/VH.  He still has a slight tremor but no other physical complaints. He is asked if he is wanting to go to rehab.  He says he is counting days and is not ready to decide.  He is alert, oriented and non-committal  RECOMMENDATION 1. Mental status may improve as dose of Ativan is no longer necessary 2. Will follow pt.   Hydie Langan 04/29/2011, 10:44 AM

## 2011-04-29 NOTE — Discharge Summary (Signed)
Physician Discharge Summary  Patient ID: Jake [name] MRN: [id number] DOB/AGE: [birth year] 40 y.o.  Admit date: 04/27/2011 Discharge date: 04/29/2011  Primary Care Physician:  No primary provider on file.   Discharge Diagnoses:    Present on Admission:  .Alcoholism .Toxic encephalopathy .Psychosis .Leukocytosis .Hyponatremia .Thrombocytopenia .Elevated AST (SGOT) .Elevated bilirubin .Marijuana abuse  Discharge Medications:  Medication List  As of 04/29/2011 10:46 AM   TAKE these medications         mulitivitamin with minerals Tabs   Take 1 tablet by mouth daily.             Disposition and Follow-up: The patient is being discharged home. He is instructed to avoid alcohol and to followup with a PCP of his choice as needed.   Medical Consults:  Dr. Mickeal Skinner, Psychiatry  Other Consults:  Social worker: Provided community resources for addiction help.   Procedures and Diagnostic Studies:  Portable Chest 1 View  2011-04-29  *RADIOLOGY REPORT*  Clinical Data: Leukocytosis; assess for aspiration pneumonia.  PORTABLE CHEST - 1 VIEW  Comparison: None.  Findings: The lungs are well-aerated and clear.  There is no evidence of focal opacification, pleural effusion or pneumothorax.  The heart is normal in size; the mediastinal contour is within normal limits.  Evaluation is mildly suboptimal due to patient rotation.  No acute osseous abnormalities are seen.  IMPRESSION: No acute cardiopulmonary process seen; no definite evidence for aspiration.  Original Report Authenticated By: Tonia Ghent, M.D.    Discharge Laboratory Values: Basic Metabolic Panel:  Lab Apr 29, 2011 [id number] 04/27/11 1410  NA 135 127*  K 3.2* 3.5  CL 99 86*  CO2 25 23  GLUCOSE 92 75  BUN 10 9  CREATININE 0.72 0.75  CALCIUM 8.8 9.8  MG -- --  PHOS -- --   GFR Estimated Creatinine Clearance: 97.9 ml/min (by C-G formula based on Cr of 0.72). Liver Function Tests:  Lab 04-29-2011 0755 04/27/11  1410  AST 52* 90*  ALT 36 53  ALKPHOS 91 114  BILITOT 1.0 1.3*  PROT 6.6 8.0  ALBUMIN 3.5 4.4    CBC:  Lab 29-Apr-2011 0755 04/27/11 1410  WBC 12.4* 16.1*  NEUTROABS -- 13.5*  HGB 13.0 14.2  HCT 37.9* 40.1  MCV 94.3 93.5  PLT 144* 143*   Microbiology Recent Results (from the past 240 hour(s))  MRSA PCR SCREENING     Status: Normal   Collection Time   04/27/11  5:45 PM      Component Value Range Status Comment   MRSA by PCR NEGATIVE  NEGATIVE  Final      Brief H and P: For complete details please refer to admission H and P, but in brief, Jake [name] is an 41 y.o. male the past medical history of alcoholism who, according to the emergency department room physicians notes, was brought to the hospital by the police for evaluation of altered mental status. He was admitted to the SDU with delirium   Physical Exam at Discharge: BP 139/91  Pulse 61  Temp(Src) 98.7 F (37.1 C) (Oral)  Resp 18  Ht 5\' 10"  (1.778 m)  Wt 56.4 kg (124 lb 5.4 oz)  BMI 17.84 kg/m2  SpO2 98% Gen:  NAD, awake and alert. Cardiovascular:  RRR, No M/R/G Respiratory: Lungs CTAB Gastrointestinal: Abdomen soft, NT/ND with normal active bowel sounds. Extremities: No C/E/C   Hospital Course:  Principal Problem:  *Toxic encephalopathy / Alcoholism / Marijuana Abuse/ Psychosis  Altered mental status  a combination of alcohol withdrawal, delirium, effects of Geodon, which was given by the ED physician.  Was felt to be at high-risk for delirium tremens given the degree of alcohol consumption.  UDS + THC  Initially admitted to step down unit due to high-risk of seizures, delirium tremens, and other sequela of alcohol withdrawal. Transferred to a regular bed within 24 hours. Maintained on Ativan IV per CIWA detox protocol.  Neuro checks every 4 hours for first 24 hours were nonfocal. No evidence of further psychosis. Psychiatry evaluation performed by Dr. Ferol Luz on 04/28/2011. Patient refuses inpatient  treatment and is not committable. Supplemented thiamine and folic acid. Discharge home on multivitamin. Social worker to provide community resources for addiction help. Toxic encephalopathy and psychosis have resolved. Active problems:  Leukocytosis  Likely demargination from stress response.  Urinalysis with a few bacteria, no significant WBCs.  Portable chest x-ray clear. Hyponatremia  Likely due to beer drinker's potomania  Gently hydrated. Resolved. Thrombocytopenia  Likely secondary to the toxic effects of alcohol on the bone marrow and liver disease.  Mild with no evidence of bleeding. Elevated AST (SGOT) / Elevated bilirubin  Likely from alcohol-induced hepatitis.  Mild. Advised to discontinue alcohol use.   Diet:  Regular  Activity:  No restrictions  Condition at Discharge:   Improved  Time spent on Discharge:  25 minutes  Signed: Dr. Trula Ore Jaiyah Beining Pager [phone] 04/29/2011, 10:46 AM

## 2011-04-29 NOTE — Progress Notes (Signed)
Met with Dawn and Carollee Herter, Pt's sister-in-law and brother, respectively.  Per family, Pt has struggled with alcoholism for many years.  They explained that Pt will seek tx but when the time comes to begin tx, Pt won't follow through.  Family state that Pt has been cared for by others all of his life and that he has limited knowledge of how to care for himself and manage his life.  Family state that Pt's trust money is running out fast, as Pt continually asks for more and more.  Family is frustrated with Pt's behaviors and do not feel that Pt is wanting tx.  They question the reason behind him choosing to stay.   Family stated that Pt has voiced, by hx, that he wants to go to [facility] and they state that he has received tx by hx.  Family states that Pt's money is running out quickly and their concerned about the cost of tx.  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  Family states that Pt is capable of working but that he chooses to drink instead.  CSW thanked family for their time.  Providence Crosby, LCSWA Clinical Social Work [phone]

## 2011-04-30 MED ORDER — HYDROCODONE-ACETAMINOPHEN 5-325 MG PO TABS: 1.0000 | ORAL_TABLET | ORAL | Status: AC | PRN

## 2011-04-30 MED ORDER — POTASSIUM CHLORIDE CRYS ER 20 MEQ PO TBCR
40.0000 meq | EXTENDED_RELEASE_TABLET | Freq: Once | ORAL | Status: AC
  Administered 2011-04-30: 40 meq via ORAL
  Filled 2011-04-30: qty 2

## 2011-04-30 MED ORDER — LORAZEPAM 1 MG PO TABS: 1.0000 mg | ORAL_TABLET | Freq: Four times a day (QID) | ORAL | Status: AC | PRN

## 2011-04-30 MED ORDER — NICOTINE 21 MG/24HR TD PT24: 1.0000 | MEDICATED_PATCH | Freq: Every day | TRANSDERMAL | Status: AC

## 2011-04-30 NOTE — Discharge Summary (Signed)
Patient ID: Jake [name] MRN: [id number] DOB/AGE: [birth year] 41 y.o.  Admit date: 04/27/2011 Discharge date: 04/30/2011  Primary Care Physician:  No primary provider on file.  Hospital Course:   Principal Problem:   *Toxic encephalopathy / Alcoholism / Marijuana Abuse/ Psychosis  Altered mental status a combination of alcohol withdrawal, delirium, effects of Geodon, which was given by the ED physician.  Was felt to be at high-risk for delirium tremens given the degree of alcohol consumption.  UDS + THC  Initially admitted to step down unit due to high-risk of seizures, delirium tremens, and other sequela of alcohol withdrawal.  Transferred to a regular bed within 24 hours.  Maintained on Ativan IV per [facility] detox protocol.  Neuro checks every 4 hours for first 24 hours were nonfocal.  No evidence of further psychosis.  Psychiatry evaluation performed by Dr. Ferol Luz on 04/28/2011. Patient refuses inpatient treatment and is not committable.  Supplemented thiamine and folic acid.  Discharge home on multivitamin.  Social worker to provide community resources for addiction help.  Toxic encephalopathy and psychosis have resolved.  Active problems:   Leukocytosis  Likely demargination from stress response.  Urinalysis with a few bacteria, no significant WBCs.  Portable chest x-ray clear.  Hyponatremia  Likely due to beer drinker's potomania  Gently hydrated.  Resolved.  Thrombocytopenia  Likely secondary to the toxic effects of alcohol on the bone marrow and liver disease.  Mild with no evidence of bleeding.  Elevated AST (SGOT) / Elevated bilirubin  Likely from alcohol-induced hepatitis.  Mild.  Advised to discontinue alcohol use.  Diet: Regular   Activity: No restrictions   Condition at Discharge: Improved   Medication List  As of 04/30/2011  1:54 PM   TAKE these medications         HYDROcodone-acetaminophen 5-325 MG per tablet   Commonly known as: NORCO   Take  1-2 tablets by mouth every 4 (four) hours as needed.      LORazepam 1 MG tablet   Commonly known as: ATIVAN   Take 1 tablet (1 mg total) by mouth every 6 (six) hours as needed for anxiety (CIWA-AR > 8  -OR-  withdrawal symptoms:  anxiety, agitation, insomnia, diaphoresis, nausea, vomiting, tremors, tachycardia, or hypertension.).      mulitivitamin with minerals Tabs   Take 1 tablet by mouth daily.      nicotine 21 mg/24hr patch   Commonly known as: NICODERM CQ - dosed in mg/24 hours   Place 1 patch onto the skin daily.            Disposition and Follow-up:  - clinically stable for discharge - potassium supplemented prior to discharge, patient insisted on leaving and was instructed to follow up with PCP to recheck potassium level  Consults:  1. Psychiatry  Significant Diagnostic Studies:  Portable Chest 1 View 04/28/2011  IMPRESSION: No acute cardiopulmonary process seen; no definite evidence for aspiration.     Brief H and P: 41 y.o. male the past medical history of alcoholism who, according to the emergency department room physicians notes, was brought to the hospital by the police for evaluation of altered mental status. He reportedly drinks a fifth of liquor a day as well as a case of beer. He apparently was found in a dumpster. His last alcoholic drink was at 2 AM prior to the day of admission. While being evaluated in emergency department, he became increasingly belligerent and combative and has  been sedated with Geodon. He was  unable to provide medical history on admission but at this time he is oriented to place, person and time.   Physical Exam on Discharge:  Filed Vitals:   04/29/11 2200 04/30/11 0530 04/30/11 0800 04/30/11 1012  BP: 146/99 116/77 116/77 119/81  Pulse: 65 92 92 90  Temp: 98.6 F (37 C) 98 F (36.7 C)  98.9 F (37.2 C)  TempSrc: Oral Oral  Oral  Resp: 18 18  20   Height:      Weight:      SpO2: 97% 95%  97%     Intake/Output Summary (Last  24 hours) at 04/30/11 1354 Last data filed at 04/30/11 0910  Gross per 24 hour  Intake    723 ml  Output      0 ml  Net    723 ml    General: Alert, awake, oriented x3, in no acute distress. HEENT: No bruits, no goiter. Heart: Regular rate and rhythm, without murmurs, rubs, gallops. Lungs: Clear to auscultation bilaterally. Abdomen: Soft, nontender, nondistended, positive bowel sounds. Extremities: No clubbing cyanosis or edema with positive pedal pulses. Neuro: Grossly intact, nonfocal.  CBC:    Component Value Date/Time   WBC 12.4* 04/28/2011 0755   HGB 13.0 04/28/2011 0755   HCT 37.9* 04/28/2011 0755   PLT 144* 04/28/2011 0755   MCV 94.3 04/28/2011 0755   NEUTROABS 13.5* 04/27/2011 1410   LYMPHSABS 1.3 04/27/2011 1410   MONOABS 1.3* 04/27/2011 1410   EOSABS 0.0 04/27/2011 1410   BASOSABS 0.0 04/27/2011 1410    Basic Metabolic Panel:    Component Value Date/Time   NA 135 04/28/2011 0755   K 3.2* 04/28/2011 0755   CL 99 04/28/2011 0755   CO2 25 04/28/2011 0755   BUN 10 04/28/2011 0755   CREATININE 0.72 04/28/2011 0755   GLUCOSE 92 04/28/2011 0755   CALCIUM 8.8 04/28/2011 0755    Time spent on Discharge: Greater than 30 minutes  Signed: Brigett Estell 04/30/2011, 1:54 PM

## 2011-04-30 NOTE — Progress Notes (Signed)
   CARE MANAGEMENT NOTE 04/30/2011  Patient:  Jake [name], Jake [name]   Account Number:  [id number]  Date Initiated:  04/28/2011  Documentation initiated by:  DAVIS,RHONDA  Subjective/Objective Assessment:   pt brought in by gpd intoxication, hx of same and seizures,     Action/Plan:   psych consult, doubt is patient will agree to assitance has a court date on [phone] in [place].   Anticipated DC Date:  04/30/2011   Anticipated DC Plan:  HOME/SELF CARE  In-house referral  Clinical Social Worker      DC Planning Services  Outpatient Services - Pt will follow up  CM consult      PAC Choice  NA   Choice offered to / List presented to:  NA   DME arranged  NA      DME agency  NA     HH arranged  NA      HH agency  NA   Status of service:  In process, will continue to follow Medicare Important Message given?  NA - LOS <3 / Initial given by admissions (If response is "NO", the following Medicare IM given date fields will be blank) Date Medicare IM given:   Date Additional Medicare IM given:    Discharge Disposition:  HOME/SELF CARE  Per UR Regulation:  Reviewed for med. necessity/level of care/duration of stay  If discussed at Long Length of Stay Meetings, dates discussed:    Comments:  04-30-11 [name], [place] [phone] Received notification from MD that patient now agreeable to PCP information. Gave patient a list of resources of clinics that accept self pay patient. Appreciative of the information.  04-30-11 [name] [phone] Spoke with Jake [name] at bedside. States he does not have any dc needs at this time. States he was given a Pharmacist, hospital earlier and did not need any other resources. Pt anxious to dc. MD paged to be made aware.+      [id number]/Rhonda Davis,RN,BSN,CCM

## 2011-04-30 NOTE — Progress Notes (Signed)
04-30-11 Spoke with Mr Fera at bedside. States he does not have any dc needs at this time. States he was given a Pharmacist, hospital earlier and did not need any other resources. Pt anxious to dc. MD paged to be made aware.+  [name], RN,BSN,CM  [phone]

## 2011-04-30 NOTE — Consult Note (Signed)
Reason for Consult:Alcohol Detox Referring Physician: Dr. Priscille Heidelberg is an 41 y.o. male.  Jake [name] reports he was made to come to the ED by the police this morning. States an Technical sales engineer was chasing him and wanted to talk to arrest him for fishing without a license but in the process suggested that the patient come here for detox. Pt states that he came because they "made him" but he does not want detox right now, states he is too busy and has bills to pay and a court date. State he drinks beer and liquor "whenever I'm awake" and last drank at 2 or 3am today. Reported to nurse that he drinks a case of beer and a fifth to a half gallon of vodka daily. States he is now feeling very shaky and he is starting to hallucinate, stating that there was a woman working in the hallway who "turned into" a tv personality. Denies CP, SOB, abdominal pain, N/V, body aches.   AXIS I Alcohol Dependence In Detox with DTs, Cannabis abuse, Nicotine Dependence  AXIS II Deferred  AXIS III Past Medical History  Diagnosis Date  . ETOH abuse   . Seizures   . Hip dislocation   . Depression   . Anxiety     History reviewed. No pertinent past surgical history. AXIS IV  Family support, legal issues, Poor impulse control, financial issues AXIS V  GAF 35   No family history on file.  Social History:  reports that he has been smoking.  He has never used smokeless tobacco. He reports that he drinks alcohol. He reports that he uses illicit drugs (Marijuana).  Allergies: No Known Allergies  Medications: I have reviewed the patient's current medications.  No results found for this or any previous visit (from the past 48 hour(s)).  No results found.  Review of Systems  Unable to perform ROS: other   Blood pressure 116/77, pulse 92, temperature 98 F (36.7 C), temperature source Oral, resp. rate 18, height 5\' 10"  (1.778 m), weight 56.4 kg (124 lb 5.4 oz), SpO2 95.00%. Physical Exam  Assessment/Plan:   Chart Reviewed  Discussed with Psych CSW Pt is sitting up.  He says he has decided to go to rehab for alcohol.  He says he is tired of waking up with shakes and vomiting.  He also has to go to court next week for "just a small amount of marijuana possession".  He says someone called police.  He was found on the parking deck and he says he was there to smoke a cigarette.  He is reminded that he was smoking with a nicotine patch on. He minimizes and says that the patch is two days old.  Risk of smoking and wearing patch is restated.  His answer was 'nicotine poisoning'.  He is alert, organized, oriented.  He says he has worked at various jobs and thinks he will go to work again.  A work experience is important for this patient.  Brother and sister in law apparently are managing his trust fund since he has been drinking alcohol.  His sister in law says his funds are almost depleted He denies SI/HI/AH/VH.  He is goal oriented to court date and rehab.  He has no residual signs/sx of detox.  Pt wants to be discharged today    Wava Kildow 04/30/2011, 9:27 AM

## 2011-04-30 NOTE — Progress Notes (Signed)
Met with Pt re: d/c plans.  Pt states that he's ready to go home today and that he does not require CSW's assistance with rehab.  He states that he provided his sister-in-law with the information on [facility] and [facility] give to him yesterday by CSW.  Per Pt, his sister-in-law will help facilitate admission into one of these facilities.  Pt had no other identified needs.  Pt to be d/c'd.  Providence Crosby, LCSWA Clinical Social Work [phone]

## 2011-04-30 NOTE — Discharge Instructions (Signed)
Chronic Alcoholism  Alcoholism is an addiction to alcohol. Addiction is a medical illness. It is not an one-time incident of heavy drinking that defines the disease of alcoholism.   The characteristics of addictive disease, such as alcoholism, include behaviors that the person finds pleasurable, at least initially. In alcohol addiction, drinking causes chemical changes in brain activity. This can lead to frequent cravings for alcohol. Unfortunately over time, an increased amount of alcohol is needed to produce the pleasure (tolerance). As a result, the person will start to feel uncomfortable symptoms when he or she is not drinking (withdrawal).  Over time, a bad cycle develops. When painful withdrawal symptoms start to appear, alcohol is needed to make the symptoms go away. During this process, the person addicted to alcohol may become so used to the effects of alcohol that the usual signs of intoxication such as slurred speech, a staggering walk, or sleepiness may no longer be shown. Many people addicted to alcohol are able to function and even complete tasks.  CAUSES    Drinking heavily and frequently.   Other factors like genetics.  SYMPTOMS    Headaches.   Frequent trouble falling or staying asleep (insomnia).   Irritability.   Uncontrolled shaking or movement (tremors).   Forgetting events (brownouts) or passing out (blackouts).   Seizures or hallucinations (delirium tremens).   Problems at work or at home that are related to drinking.   Medical problems related to drinking such as heart disease, stroke, high blood pressure, diabetes, stomach ulcers, bleeding from the GI tract, and liver failure.   Trauma (falls, broken bones, automobile crashes).  TREATMENT   Alcoholism usually gets worse over time and almost never gets better without treatment. Your caregiver can help recommend a course of treatment for you depending on how severe your symptoms are and the level of your alcohol abuse. In some  patients, stopping alcohol use or even decreasing use can bring about withdrawal symptoms that are dangerous or even deadly. For this reason, hospitalization is sometimes required to medically stabilize a patient.   If hospitalization is not required, but the risk of withdrawal is high, a detoxification (detox) facility may be recommended as an initial treatment step. In a detox center, medications can be given to protect against seizures and other withdrawal symptoms.  Rehabilitation treatment may also be necessary. This is the process of treating the psychological and lifestyle element of addiction. There is both inpatient and outpatient rehabilitation treatment. Inpatient programs help patients through a systematic plan of psychological questioning, both individually and in groups, and at times using a "twelve-step" format. Outpatient rehabilitation programs have a similar structure and are aimed at promoting continued sobriety and preventing relapse into addiction.  Make treatment decisions together with your caregiver.  HOME CARE INSTRUCTIONS    If you are concerned about your alcohol use, talk to someone who can help. Trying to quit on your own is not easy. It can even be medically dangerous. This is especially true if you have been drinking heavily for a long time.   Call your caregiver, Alcoholics Anonymous, or other alcoholic treatment programs for help.   AL-ANON and ALA-TEEN are support groups for friends and family members of an alcohol or drug dependent person. These people also often need help too. For information about these organizations, check your phone directory or the internet. You can also call a local alcohol or chemical dependency treatment center.  Document Released: 02/07/2004 Document Revised: 12/19/2010 Document Reviewed: 06/15/2009  ExitCare 

## 2013-06-27 ENCOUNTER — Encounter (HOSPITAL_COMMUNITY): Payer: Self-pay | Admitting: Emergency Medicine

## 2013-06-27 ENCOUNTER — Emergency Department (HOSPITAL_COMMUNITY)
Admission: EM | Admit: 2013-06-27 | Discharge: 2013-06-28 | Disposition: A | Discharge status: Home / Self Care | Payer: Self-pay | Attending: Emergency Medicine | Admitting: Emergency Medicine

## 2013-06-27 DIAGNOSIS — F10230 Alcohol dependence with withdrawal, uncomplicated: Secondary | ICD-10-CM | POA: Diagnosis present

## 2013-06-27 DIAGNOSIS — F1920 Other psychoactive substance dependence, uncomplicated: Secondary | ICD-10-CM | POA: Insufficient documentation

## 2013-06-27 DIAGNOSIS — F1310 Sedative, hypnotic or anxiolytic abuse, uncomplicated: Secondary | ICD-10-CM | POA: Insufficient documentation

## 2013-06-27 DIAGNOSIS — F1110 Opioid abuse, uncomplicated: Secondary | ICD-10-CM | POA: Insufficient documentation

## 2013-06-27 DIAGNOSIS — F1410 Cocaine abuse, uncomplicated: Secondary | ICD-10-CM | POA: Insufficient documentation

## 2013-06-27 DIAGNOSIS — F10231 Alcohol dependence with withdrawal delirium: Secondary | ICD-10-CM

## 2013-06-27 DIAGNOSIS — Z87828 Personal history of other (healed) physical injury and trauma: Secondary | ICD-10-CM | POA: Insufficient documentation

## 2013-06-27 DIAGNOSIS — F10239 Alcohol dependence with withdrawal, unspecified: Secondary | ICD-10-CM

## 2013-06-27 DIAGNOSIS — F1120 Opioid dependence, uncomplicated: Secondary | ICD-10-CM

## 2013-06-27 DIAGNOSIS — F10931 Alcohol use, unspecified with withdrawal delirium: Secondary | ICD-10-CM

## 2013-06-27 DIAGNOSIS — F1910 Other psychoactive substance abuse, uncomplicated: Secondary | ICD-10-CM

## 2013-06-27 DIAGNOSIS — F1010 Alcohol abuse, uncomplicated: Secondary | ICD-10-CM | POA: Diagnosis present

## 2013-06-27 DIAGNOSIS — F10939 Alcohol use, unspecified with withdrawal, unspecified: Secondary | ICD-10-CM | POA: Insufficient documentation

## 2013-06-27 DIAGNOSIS — F17200 Nicotine dependence, unspecified, uncomplicated: Secondary | ICD-10-CM | POA: Insufficient documentation

## 2013-06-27 DIAGNOSIS — F10930 Alcohol use, unspecified with withdrawal, uncomplicated: Secondary | ICD-10-CM | POA: Diagnosis present

## 2013-06-27 DIAGNOSIS — Z8669 Personal history of other diseases of the nervous system and sense organs: Secondary | ICD-10-CM | POA: Insufficient documentation

## 2013-06-27 LAB — RAPID URINE DRUG SCREEN, HOSP PERFORMED
Amphetamines: NOT DETECTED
Barbiturates: NOT DETECTED
Benzodiazepines: NOT DETECTED
COCAINE: POSITIVE — AB
OPIATES: NOT DETECTED
TETRAHYDROCANNABINOL: POSITIVE — AB

## 2013-06-27 LAB — CBC WITH DIFFERENTIAL/PLATELET
BASOS ABS: 0.2 10*3/uL — AB (ref 0.0–0.1)
BASOS PCT: 2 % — AB (ref 0–1)
EOS PCT: 1 % (ref 0–5)
Eosinophils Absolute: 0.1 10*3/uL (ref 0.0–0.7)
HEMATOCRIT: 48.5 % (ref 39.0–52.0)
Hemoglobin: 17.1 g/dL — ABNORMAL HIGH (ref 13.0–17.0)
Lymphocytes Relative: 29 % (ref 12–46)
Lymphs Abs: 2.3 10*3/uL (ref 0.7–4.0)
MCH: 32.8 pg (ref 26.0–34.0)
MCHC: 35.3 g/dL (ref 30.0–36.0)
MCV: 92.9 fL (ref 78.0–100.0)
MONO ABS: 1 10*3/uL (ref 0.1–1.0)
Monocytes Relative: 12 % (ref 3–12)
Neutro Abs: 4.5 10*3/uL (ref 1.7–7.7)
Neutrophils Relative %: 56 % (ref 43–77)
Platelets: 270 10*3/uL (ref 150–400)
RBC: 5.22 MIL/uL (ref 4.22–5.81)
RDW: 12.9 % (ref 11.5–15.5)
WBC: 8 10*3/uL (ref 4.0–10.5)

## 2013-06-27 LAB — COMPREHENSIVE METABOLIC PANEL
ALBUMIN: 3.7 g/dL (ref 3.5–5.2)
ALT: 24 U/L (ref 0–53)
AST: 37 U/L (ref 0–37)
Alkaline Phosphatase: 100 U/L (ref 39–117)
BUN: 3 mg/dL — ABNORMAL LOW (ref 6–23)
CALCIUM: 8.9 mg/dL (ref 8.4–10.5)
CO2: 20 meq/L (ref 19–32)
CREATININE: 0.61 mg/dL (ref 0.50–1.35)
Chloride: 96 mEq/L (ref 96–112)
GFR calc Af Amer: >90 mL/min (ref >90)
Glucose, Bld: 93 mg/dL (ref 70–99)
Potassium: 3.7 mEq/L (ref 3.7–5.3)
Sodium: 136 mEq/L — ABNORMAL LOW (ref 137–147)
Total Bilirubin: 0.4 mg/dL (ref 0.3–1.2)
Total Protein: 7.5 g/dL (ref 6.0–8.3)

## 2013-06-27 LAB — ETHANOL: Alcohol, Ethyl (B): 383 mg/dL — ABNORMAL HIGH (ref 0–11)

## 2013-06-27 LAB — ACETAMINOPHEN LEVEL: Acetaminophen (Tylenol), Serum: <15 ug/mL (ref 10–30)

## 2013-06-27 LAB — SALICYLATE LEVEL: Salicylate Lvl: <2 mg/dL — ABNORMAL LOW (ref 2.8–20.0)

## 2013-06-27 MED ORDER — ONDANSETRON HCL 4 MG PO TABS
4.0000 mg | ORAL_TABLET | Freq: Three times a day (TID) | ORAL | Status: DC | PRN
Start: 2013-06-27 — End: 2013-06-28
  Administered 2013-06-28: 4 mg via ORAL
  Filled 2013-06-27: qty 1

## 2013-06-27 MED ORDER — ACETAMINOPHEN 325 MG PO TABS: 650.0000 mg | ORAL_TABLET | ORAL | Status: DC | PRN

## 2013-06-27 MED ORDER — LORAZEPAM 1 MG PO TABS
0.0000 mg | ORAL_TABLET | Freq: Four times a day (QID) | ORAL | Status: DC
  Administered 2013-06-28: 2 mg via ORAL
  Filled 2013-06-27: qty 2

## 2013-06-27 MED ORDER — IBUPROFEN 200 MG PO TABS: 600.0000 mg | ORAL_TABLET | Freq: Three times a day (TID) | ORAL | Status: DC | PRN

## 2013-06-27 MED ORDER — VITAMIN B-1 100 MG PO TABS
100.0000 mg | ORAL_TABLET | Freq: Every day | ORAL | Status: DC
  Administered 2013-06-28: 100 mg via ORAL
  Filled 2013-06-27: qty 1

## 2013-06-27 MED ORDER — THIAMINE HCL 100 MG/ML IJ SOLN: 100.0000 mg | Freq: Every day | INTRAMUSCULAR | Status: DC

## 2013-06-27 MED ORDER — LORAZEPAM 1 MG PO TABS
1.0000 mg | ORAL_TABLET | Freq: Three times a day (TID) | ORAL | Status: DC | PRN
  Administered 2013-06-28: 1 mg via ORAL
  Filled 2013-06-27: qty 1

## 2013-06-27 MED ORDER — ZOLPIDEM TARTRATE 5 MG PO TABS: 5.0000 mg | ORAL_TABLET | Freq: Every evening | ORAL | Status: DC | PRN

## 2013-06-27 MED ORDER — ALUM & MAG HYDROXIDE-SIMETH 200-200-20 MG/5ML PO SUSP: 30.0000 mL | ORAL | Status: DC | PRN

## 2013-06-27 MED ORDER — LORAZEPAM 1 MG PO TABS: 0.0000 mg | ORAL_TABLET | Freq: Two times a day (BID) | ORAL | Status: DC

## 2013-06-27 MED ORDER — NICOTINE 21 MG/24HR TD PT24
21.0000 mg | MEDICATED_PATCH | Freq: Every day | TRANSDERMAL | Status: DC
  Administered 2013-06-27 – 2013-06-28 (×2): 21 mg via TRANSDERMAL
  Filled 2013-06-27: qty 1

## 2013-06-27 NOTE — ED Notes (Signed)
Patient resting in position of comfort with eyes closed RR WNL--even and unlabored with equal rise and fall of chest Patient in NAD Side rails up 

## 2013-06-27 NOTE — ED Notes (Signed)
Pt has a Red book bag with clothes, red boots, and a pair of black nike shoes.

## 2013-06-27 NOTE — Progress Notes (Signed)
  CARE MANAGEMENT ED NOTE 06/27/2013  Patient:  Jake [name],Jake [name]   Account Number:  [id number]  Date Initiated:  06/27/2013  Documentation initiated by:  Radford PaxFERRERO,Nathaniel Wakeley  Subjective/Objective Assessment:   Patient presents to Ed with drug abuse heroine, benzos, narcotics     Subjective/Objective Assessment Detail:     Action/Plan:   Action/Plan Detail:   Anticipated DC Date:       Status Recommendation to Physician:   Result of Recommendation:    Other ED Services  Consult Working Plan    DC Planning Services  Other  PCP issues    Choice offered to / List presented to:            Status of service:  Completed, signed off  ED Comments:   ED Comments Detail:  EDCM spoke to patient at bedside.  Patient reports , "I need some help geting off of this stuff.  Four of my girlfriends are dead. They're in the ground.  I don't have much luck with girlfriends." Patient  is unemployeed but reports he is not homeless.  Patient confirms he does [place] have a pcp or [place].  [facility] with [facility] who accept [facility] pay patients, including [facility].  EDCM inormed patient  that [facility] Mon-Thurs from 9am to 1030am.  EDCM provided patient with phone number to Medicaid and affrodable care act for insurance, provided financial resources inthe community such as local churches, [employer], provided discounted pharmacy list and list of dentists who accept selfpay patients.  EDCM offered emotional support.  Patient thankful for [facility] concern and resources.  No further EDCM needs at this time.

## 2013-06-27 NOTE — ED Provider Notes (Signed)
Jake [name] 8:00 PM patient discussed in sign out. Patient presenting with request for alcohol and drug addiction in detox. Patient has history of the same and has failed previous rehabilitation treatments. Denies any SI or HI. Initial medical exam and concerning a side from slight tachycardia. Patient does appear intoxicated. Additional laboratory testing pending.  9:00 PM patient'[name] laboratory tests on concerning. Does have elevated alcohol level of 383 with positive cocaine use and cannabis use. Patient is otherwise medically cleared and stable for further psychiatric evaluation and detox. Holding orders in place.  Jake [name] [name], PA-C 06/27/13 2105

## 2013-06-27 NOTE — ED Provider Notes (Signed)
CSN: [id number]     Arrival date & time 06/27/13  1757 History   First MD Initiated Contact with Patient 06/27/13 1920     This chart was scribed for non-physician practitioner, Sharilyn SitesLisa Weylin Plagge, PA-C working with Doug SouSam Jacubowitz, MD by Arlan OrganAshley Leger, ED Scribe. This patient was seen in room WTR4/[facility]PT4 and the patient's care was started at 7:25 PM.   Chief Complaint  Patient presents with  . Addiction Problem   The history is provided by the patient. No language interpreter was used.    HPI Comments: Jake [name] is a 43 y.o. male with a PMHx of ETOH abuse, seizures, depression, and anxiety who presents to the Emergency Department requesting detox today. Pt states he currently abuses heroin, cocaine, narcotics, and benzodiazepines. Pt uses drugs every few days, last use was 3 days ago. He admits to consuming about 1 case of beer daily along with 1-5th of Vodka daily, last drink was PTA. States he has been drinking this way since he was [age]. He is an every day smoker and smokes about 3 packs of cigarettes a day. States he went into recovery with [facility] last years and was sober for about 4 years. He attributes his relapse to peer pressure and constant association with others with drug issues. Pt states he triggers his current addiction problem to the loss of 4 girlfriends in the last year. No SI/HI. He denies any auditory/visual hallucinations. He denies any discomfort at this time. No other concerns this visit.  Past Medical History  Diagnosis Date  . ETOH abuse   . Seizures   . Hip dislocation   . Depression   . Anxiety    History reviewed. No pertinent past surgical history. History reviewed. No pertinent family history. History  Substance Use Topics  . Smoking status: Current Every Day Smoker  . Smokeless tobacco: Never Used  . Alcohol Use: Yes    Review of Systems  Constitutional: Negative for fever and chills.  HENT: Negative for congestion.   Eyes: Negative for redness.   Respiratory: Negative for cough.   Skin: Negative for rash.  Psychiatric/Behavioral: Negative for suicidal ideas, hallucinations and confusion.       Detox  All other systems reviewed and are negative.     Allergies  Review of patient's allergies indicates no known allergies.  Home Medications   Prior to Admission medications   Not on File   Triage Vitals: BP 127/85  Pulse 107  Temp(Src) 98.3 F (36.8 C) (Oral)  Resp 18  SpO2 96%   Physical Exam  Nursing note and vitals reviewed. Constitutional: He is oriented to person, place, and time. He appears well-developed and well-nourished.  HENT:  Head: Normocephalic and atraumatic.  Mouth/Throat: Oropharynx is clear and moist.  Eyes: Conjunctivae and EOM are normal. Pupils are equal, round, and reactive to light.  Neck: Normal range of motion.  Cardiovascular: Normal rate, regular rhythm and normal heart sounds.   Pulmonary/Chest: Effort normal and breath sounds normal.  Abdominal: Soft. Bowel sounds are normal. He exhibits no distension.  Musculoskeletal: Normal range of motion.  Neurological: He is alert and oriented to person, place, and time.  Skin: Skin is warm and dry.  Psychiatric: He has a normal mood and affect.    ED Course  Procedures (including critical care time)  DIAGNOSTIC STUDIES: Oxygen Saturation is 96% on RA, Adequate by my interpretation.    COORDINATION OF CARE: 7:30 PM- Will order appropriate blood panel/work up for medical  clearance. Discussed treatment plan with pt at bedside and pt agreed to plan.     Labs Review Labs Reviewed  CBC WITH DIFFERENTIAL - Abnormal; Notable for the following:    Hemoglobin 17.1 (*)    Basophils Relative 2 (*)    Basophils Absolute 0.2 (*)    All other components within normal limits  COMPREHENSIVE METABOLIC PANEL  ETHANOL  URINE RAPID DRUG SCREEN (HOSP PERFORMED)  ACETAMINOPHEN LEVEL  SALICYLATE LEVEL    Imaging Review No results found.   EKG  Interpretation None      MDM   Final diagnoses:  Polysubstance abuse   43 y.o. M with polysubstance abuse-- EtOH, cocaine, heroin, opiods, benzos.  Denies SI/HI/AVH.  Psych holding orders placed, pt moved to psych ED pending TTS evaluation.  Pt is on CIWA protocol.  Labs pending at this time, PA Dammen will follow and address any abnormalities.  I personally performed the services described in this documentation, which was scribed in my presence. The recorded information has been reviewed and is accurate.    Garlon HatchetLisa M Yeraldin Litzenberger, PA-C 06/27/13 2015

## 2013-06-27 NOTE — ED Notes (Signed)
Assumed care of patient Patient denies SI/HI  Patient states that he is here for detox from various substances Patient with strong smell of ETOH, poor hygiene and disheveled appearance Meal/drink given

## 2013-06-27 NOTE — ED Notes (Signed)
Pt describes being addicted to heroine, narcotics, benzo for last year.  Pt states in last year, he has lost 4 girlfriends.  They "died" which has triggered his depression.  [facility] and was sober for 4 months.  No SI/HI

## 2013-06-28 ENCOUNTER — Encounter (HOSPITAL_COMMUNITY): Payer: Self-pay | Admitting: Registered Nurse

## 2013-06-28 DIAGNOSIS — F10930 Alcohol use, unspecified with withdrawal, uncomplicated: Secondary | ICD-10-CM | POA: Diagnosis present

## 2013-06-28 DIAGNOSIS — F1010 Alcohol abuse, uncomplicated: Secondary | ICD-10-CM | POA: Diagnosis present

## 2013-06-28 DIAGNOSIS — F10230 Alcohol dependence with withdrawal, uncomplicated: Secondary | ICD-10-CM | POA: Diagnosis present

## 2013-06-28 DIAGNOSIS — F10231 Alcohol dependence with withdrawal delirium: Secondary | ICD-10-CM

## 2013-06-28 DIAGNOSIS — F10931 Alcohol use, unspecified with withdrawal delirium: Secondary | ICD-10-CM

## 2013-06-28 LAB — ETHANOL
ALCOHOL ETHYL (B): 228 mg/dL — AB (ref 0–11)
Alcohol, Ethyl (B): <11 mg/dL (ref 0–11)

## 2013-06-28 MED ORDER — CLONIDINE HCL 0.1 MG PO TABS: 0.1000 mg | ORAL_TABLET | Freq: Four times a day (QID) | ORAL | Status: DC

## 2013-06-28 MED ORDER — ONDANSETRON 4 MG PO TBDP: 4.0000 mg | ORAL_TABLET | Freq: Four times a day (QID) | ORAL | Status: DC | PRN

## 2013-06-28 MED ORDER — METHOCARBAMOL 500 MG PO TABS: 500.0000 mg | ORAL_TABLET | Freq: Three times a day (TID) | ORAL | Status: DC | PRN

## 2013-06-28 MED ORDER — CHLORDIAZEPOXIDE HCL 25 MG PO CAPS: 25.0000 mg | ORAL_CAPSULE | Freq: Three times a day (TID) | ORAL | Status: DC

## 2013-06-28 MED ORDER — CLONIDINE HCL 0.1 MG PO TABS: 0.1000 mg | ORAL_TABLET | Freq: Every day | ORAL | Status: DC

## 2013-06-28 MED ORDER — CLONIDINE HCL 0.1 MG PO TABS: 0.1000 mg | ORAL_TABLET | ORAL | Status: DC

## 2013-06-28 MED ORDER — LOPERAMIDE HCL 2 MG PO CAPS: 2.0000 mg | ORAL_CAPSULE | ORAL | Status: DC | PRN

## 2013-06-28 MED ORDER — CHLORDIAZEPOXIDE HCL 25 MG PO CAPS: 25.0000 mg | ORAL_CAPSULE | Freq: Once | ORAL | Status: DC

## 2013-06-28 MED ORDER — HYDROXYZINE HCL 25 MG PO TABS: 25.0000 mg | ORAL_TABLET | Freq: Four times a day (QID) | ORAL | Status: DC | PRN

## 2013-06-28 MED ORDER — CLONIDINE HCL 0.2 MG PO TABS: 0.2000 mg | ORAL_TABLET | Freq: Two times a day (BID) | ORAL | Status: DC

## 2013-06-28 MED ORDER — DICYCLOMINE HCL 20 MG PO TABS: 20.0000 mg | ORAL_TABLET | Freq: Four times a day (QID) | ORAL | Status: DC | PRN

## 2013-06-28 MED ORDER — CHLORDIAZEPOXIDE HCL 25 MG PO CAPS: 25.0000 mg | ORAL_CAPSULE | Freq: Four times a day (QID) | ORAL | Status: DC

## 2013-06-28 MED ORDER — CHLORDIAZEPOXIDE HCL 25 MG PO CAPS: 25.0000 mg | ORAL_CAPSULE | Freq: Four times a day (QID) | ORAL | Status: DC | PRN

## 2013-06-28 MED ORDER — CHLORDIAZEPOXIDE HCL 25 MG PO CAPS: 25.0000 mg | ORAL_CAPSULE | ORAL | Status: DC

## 2013-06-28 MED ORDER — NAPROXEN 500 MG PO TABS: 500.0000 mg | ORAL_TABLET | Freq: Two times a day (BID) | ORAL | Status: DC | PRN

## 2013-06-28 MED ORDER — LORAZEPAM 1 MG PO TABS: 1.0000 mg | ORAL_TABLET | Freq: Three times a day (TID) | ORAL | Status: DC | PRN

## 2013-06-28 MED ORDER — CHLORDIAZEPOXIDE HCL 25 MG PO CAPS: 25.0000 mg | ORAL_CAPSULE | Freq: Every day | ORAL | Status: DC

## 2013-06-28 NOTE — Consult Note (Signed)
[facility] Face-to-Face Psychiatry Consult   Reason for Consult:  Alcohol Withdrawal and history of DT's Referring Physician:  EDP  Jake [name] is an 43 y.o. male. Total Time spent with patient: 45 minutes  Assessment: AXIS I:  Alcohol Abuse, Substance Abuse and Substance Induced Mood Disorder AXIS II:  Deferred AXIS III:   Past Medical History  Diagnosis Date  . ETOH abuse   . Seizures   . Hip dislocation   . Depression   . Anxiety    AXIS IV:  other psychosocial or environmental problems AXIS V:  41-50 serious symptoms  Plan:  Recommend Medical admission for detox related to history of DT's with alcohol withdrawal  Subjective:   Jake [name] is a 43 y.o. male patient admitted with Alcohol abuse, History of DT's with alcohol withdrawal.  HPI:  Patient states "I got a alcohol and drug problem and I need help; I want to get straight.  I was clean and sober for 6 months until my room mate started using cocaine and I fell off the wagon.  I been drinking 1/2 gallon day sometimes with a 12 pack beer. I been using heroin and opium pills and pallets; not everyday but 2-3 times a week."  Patient states that the last time he was detox he was admitted to the hospital because of DT's.  "I was hallucinating and everything.   I was seeing people running around naked and police chasing me.  I had 7 or 8 seizures. Patient denies suicidal/homicidal ideation, psychosis, and paranoia. HPI Elements:   Location:  Alcohol withdrawal. Quality:  History of DT's. Severity:  History of DT's. Timing:  1 day. Review of Systems  Constitutional: Positive for diaphoresis.  Musculoskeletal: Negative.   Neurological: Negative for tremors and seizures.  Psychiatric/Behavioral: Positive for suicidal ideas and substance abuse. Negative for depression. Hallucinations: History with withdrawal. The patient is nervous/anxious.     History reviewed. No pertinent family history.  Past Psychiatric History: Past  Medical History  Diagnosis Date  . ETOH abuse   . Seizures   . Hip dislocation   . Depression   . Anxiety     reports that he has been smoking.  He has never used smokeless tobacco. He reports that he drinks alcohol. He reports that he uses illicit drugs (Marijuana and Cocaine). History reviewed. No pertinent family history. Family History Substance Abuse: No Family Supports: No Living Arrangements: Alone Can pt return to current living arrangement?: Yes Abuse/Neglect [facility]) Physical Abuse: Denies Verbal Abuse: Denies Sexual Abuse: Denies Allergies:  No Known Allergies  ACT Assessment Complete:  Yes:    Educational Status    Risk to Self: Risk to self Suicidal Ideation: No Suicidal Intent: No Is patient at risk for suicide?: No Suicidal Plan?: No Access to Means: No What has been your use of drugs/alcohol within the last 12 months?: Abusing: heroin, pain pills, cocaine/crack, thc, benzos, alcohol Previous Attempts/Gestures: No How many times?: 0 Other Self Harm Risks: None  Triggers for Past Attempts: None known Intentional Self Injurious Behavior: None Family Suicide History: No Recent stressful life event(s): Other (Comment) (Chronic SA ) Persecutory voices/beliefs?: No Depression: Yes Depression Symptoms: Loss of interest in usual pleasures Substance abuse history and/or treatment for substance abuse?: Yes Suicide prevention information given to non-admitted patients: Not applicable  Risk to Others: Risk to Others Homicidal Ideation: No Thoughts of Harm to Others: No Current Homicidal Intent: No Current Homicidal Plan: No Access to Homicidal Means: No Identified  Victim: None  History of harm to others?: No Assessment of Violence: None Noted Violent Behavior Description: None  Does patient have access to weapons?: No Criminal Charges Pending?: No Does patient have a court date: No  Abuse: Abuse/Neglect Assessment (Assessment to be complete while patient is  alone) Physical Abuse: Denies Verbal Abuse: Denies Sexual Abuse: Denies Exploitation of patient/patient's resources: Denies Self-Neglect: Denies  Prior Inpatient Therapy: Prior Inpatient Therapy Prior Inpatient Therapy: Yes Prior Therapy Dates: 2014,2004,2003 Prior [facility]Provider[facility]  Reason for Treatment: Detox/Rehab   Prior Outpatient Therapy: Prior Outpatient Therapy Prior Outpatient Therapy: No Prior Therapy Dates: None  Prior Therapy Facilty/Provider(s): None  Reason for Treatment: None   Additional Information: Additional Information 1:1 In Past 12 Months?: No CIRT Risk: No Elopement Risk: No Does patient have medical clearance?: Yes     Objective: Blood pressure 122/79, pulse 90, temperature 98.2 F (36.8 C), temperature source Oral, resp. rate 20, SpO2 99.00%.There is no weight on file to calculate BMI. Results for orders placed during the hospital encounter of 06/27/13 (from the past 72 hour(s))  CBC WITH DIFFERENTIAL     Status: Abnormal   Collection Time    06/27/13  7:36 PM      Result Value Ref Range   WBC 8.0  4.0 - 10.5 K/uL   RBC 5.22  4.22 - 5.81 MIL/uL   Hemoglobin 17.1 (*) 13.0 - 17.0 g/dL   HCT 48.5  39.0 - 52.0 %   MCV 92.9  78.0 - 100.0 fL   MCH 32.8  26.0 - 34.0 pg   MCHC 35.3  30.0 - 36.0 g/dL   RDW 12.9  11.5 - 15.5 %   Platelets 270  150 - 400 K/uL   Neutrophils Relative % 56  43 - 77 %   Neutro Abs 4.5  1.7 - 7.7 K/uL   Lymphocytes Relative 29  12 - 46 %   Lymphs Abs 2.3  0.7 - 4.0 K/uL   Monocytes Relative 12  3 - 12 %   Monocytes Absolute 1.0  0.1 - 1.0 K/uL   Eosinophils Relative 1  0 - 5 %   Eosinophils Absolute 0.1  0.0 - 0.7 K/uL   Basophils Relative 2 (*) 0 - 1 %   Basophils Absolute 0.2 (*) 0.0 - 0.1 K/uL  COMPREHENSIVE METABOLIC PANEL     Status: Abnormal   Collection Time    06/27/13  7:36 PM      Result Value Ref Range   Sodium 136 (*) 137 - 147 mEq/L   Potassium 3.7  3.7 - 5.3 mEq/L    Chloride 96  96 - 112 mEq/L   CO2 20  19 - 32 mEq/L   Glucose, Bld 93  70 - 99 mg/dL   BUN 3 (*) 6 - 23 mg/dL   Creatinine, Ser 0.61  0.50 - 1.35 mg/dL   Calcium 8.9  8.4 - 10.5 mg/dL   Total Protein 7.5  6.0 - 8.3 g/dL   Albumin 3.7  3.5 - 5.2 g/dL   AST 37  0 - 37 U/L   ALT 24  0 - 53 U/L   Alkaline Phosphatase 100  39 - 117 U/L   Total Bilirubin 0.4  0.3 - 1.2 mg/dL   GFR calc non Af Amer >90  >90 mL/min   GFR calc Af Amer >90  >90 mL/min   Comment: (NOTE)     The eGFR has been calculated using the CKD  EPI equation.     This calculation has not been validated in all clinical situations.     eGFR's persistently <90 mL/min signify possible Chronic Kidney     Disease.  ETHANOL     Status: Abnormal   Collection Time    06/27/13  7:36 PM      Result Value Ref Range   Alcohol, Ethyl (B) 383 (*) 0 - 11 mg/dL   Comment:            LOWEST DETECTABLE LIMIT FOR     SERUM ALCOHOL IS 11 mg/dL     FOR MEDICAL PURPOSES ONLY  ACETAMINOPHEN LEVEL     Status: None   Collection Time    06/27/13  7:36 PM      Result Value Ref Range   Acetaminophen (Tylenol), Serum <15.0  10 - 30 ug/mL   Comment:            THERAPEUTIC CONCENTRATIONS VARY     SIGNIFICANTLY. A RANGE OF 10-30     ug/mL MAY BE AN EFFECTIVE     CONCENTRATION FOR MANY PATIENTS.     HOWEVER, SOME ARE BEST TREATED     AT CONCENTRATIONS OUTSIDE THIS     RANGE.     ACETAMINOPHEN CONCENTRATIONS     >150 ug/mL AT 4 HOURS AFTER     INGESTION AND >50 ug/mL AT 12     HOURS AFTER INGESTION ARE     OFTEN ASSOCIATED WITH TOXIC     REACTIONS.  SALICYLATE LEVEL     Status: Abnormal   Collection Time    06/27/13  7:36 PM      Result Value Ref Range   Salicylate Lvl <8.4 (*) 2.8 - 20.0 mg/dL  URINE RAPID DRUG SCREEN (HOSP PERFORMED)     Status: Abnormal   Collection Time    06/27/13  7:55 PM      Result Value Ref Range   Opiates NONE DETECTED  NONE DETECTED   Cocaine POSITIVE (*) NONE DETECTED   Benzodiazepines NONE DETECTED   NONE DETECTED   Amphetamines NONE DETECTED  NONE DETECTED   Tetrahydrocannabinol POSITIVE (*) NONE DETECTED   Barbiturates NONE DETECTED  NONE DETECTED   Comment:            DRUG SCREEN FOR MEDICAL PURPOSES     ONLY.  IF CONFIRMATION IS NEEDED     FOR ANY PURPOSE, NOTIFY LAB     WITHIN 5 DAYS.                LOWEST DETECTABLE LIMITS     FOR URINE DRUG SCREEN     Drug Class       Cutoff (ng/mL)     Amphetamine      1000     Barbiturate      200     Benzodiazepine   166     Tricyclics       063     Opiates          300     Cocaine          300     THC              50  ETHANOL     Status: Abnormal   Collection Time    06/28/13  1:01 AM      Result Value Ref Range   Alcohol, Ethyl (B) 228 (*) 0 - 11 mg/dL   Comment:  LOWEST DETECTABLE LIMIT FOR     SERUM ALCOHOL IS 11 mg/dL     FOR MEDICAL PURPOSES ONLY  ETHANOL     Status: None   Collection Time    06/28/13 10:51 AM      Result Value Ref Range   Alcohol, Ethyl (B) <11  0 - 11 mg/dL   Comment:            LOWEST DETECTABLE LIMIT FOR     SERUM ALCOHOL IS 11 mg/dL     FOR MEDICAL PURPOSES ONLY     REPEATED TO VERIFY   Labs are reviewed see above.  Medication reviewed and no changes made.  Will start Librium and Clonidine for alcohol and opiate withdrawal.    Current Facility-Administered Medications  Medication Dose Route Frequency Provider Last Rate Last Dose  . acetaminophen (TYLENOL) tablet 650 mg  650 mg Oral Q4H PRN Larene Pickett, PA-C      . alum & mag hydroxide-simeth (MAALOX/MYLANTA) 200-200-20 MG/5ML suspension 30 mL  30 mL Oral PRN Larene Pickett, PA-C      . ibuprofen (ADVIL,MOTRIN) tablet 600 mg  600 mg Oral Q8H PRN Larene Pickett, PA-C      . LORazepam (ATIVAN) tablet 0-4 mg  0-4 mg Oral 4 times per day Larene Pickett, PA-C   2 mg at 06/28/13 1203   Followed by  . [START ON 06/29/2013] LORazepam (ATIVAN) tablet 0-4 mg  0-4 mg Oral Q12H Larene Pickett, PA-C      . LORazepam (ATIVAN) tablet 1 mg  1 mg  Oral Q8H PRN Larene Pickett, PA-C   1 mg at 06/28/13 0855  . nicotine (NICODERM CQ - dosed in mg/24 hours) patch 21 mg  21 mg Transdermal Daily Larene Pickett, PA-C   21 mg at 06/28/13 1030  . ondansetron (ZOFRAN) tablet 4 mg  4 mg Oral Q8H PRN Larene Pickett, PA-C   4 mg at 06/28/13 4585  . thiamine (VITAMIN B-1) tablet 100 mg  100 mg Oral Daily Larene Pickett, PA-C   100 mg at 06/28/13 9292   Or  . thiamine (B-1) injection 100 mg  100 mg Intravenous Daily Larene Pickett, PA-C      . zolpidem [facility]) tablet 5 mg  5 mg Oral QHS PRN Larene Pickett, PA-C       No current outpatient prescriptions on file.    Psychiatric Specialty Exam:     Blood pressure 122/79, pulse 90, temperature 98.2 F (36.8 C), temperature source Oral, resp. rate 20, SpO2 99.00%.There is no weight on file to calculate BMI.  General Appearance: Casual and Disheveled  Eye Contact::  Good  Speech:  Clear and Coherent and Normal Rate  Volume:  Normal  Mood:  Anxious  Affect:  Congruent  Thought Process:  NA and Goal Directed  Orientation:  Full (Time, Place, and Person)  Thought Content:  Rumination  Suicidal Thoughts:  No  Homicidal Thoughts:  No  Memory:  Immediate;   Good Recent;   Good Remote;   Fair  Judgement:  Fair  Insight:  Present  Psychomotor Activity:  Restlessness and Tremor  Concentration:  Fair  Recall:  Good  Fund of Knowledge:Good  Language: Good  Akathisia:  No  Handed:  Right  AIMS (if indicated):     Assets:  Communication Skills Desire for Improvement  Sleep:      Musculoskeletal: Strength & Muscle Tone: within normal limits Gait &  Station: normal Patient leans: N/A  Treatment Plan Summary: Daily contact with patient to assess and evaluate symptoms and progress in treatment Medication management Recommend medical admission for alcohol detox related to history of DT's with alcohol withdrawal.  Will inform EDP to admit.  Earleen Newport, FNP-BC 06/28/2013 4:25 PM

## 2013-06-28 NOTE — BH Assessment (Signed)
Tele Assessment Note   Jake [name] is a 43 y.o. male [facility].  Pt denies SI/HI/AVH.  Pt states his relapse and heavy use is triggered by: his last 4 girlfriends have died from substance abuse and SI. Pt says he was sober 4 mos and says he was living with people who were engaging in drug use and because of his lost,he started using again.  Pt reports the following: he uses(1) $100 worth of heroin, daily and injects in bilateral hands/arms; last use was 2 days ago, he used a "40 bag"; (2) he uses 10 pills of roxy's, oxy's and suboxone, daily; his last use was 06/26/13, he used 7 pills; (3) he uses 2 grams of crack cocaine, wkly, last use was 1 wk ago; he used 2 grams; (4) he uses 3.5 grams of powder cocaine, wkly, his last use was 1 wk ago; he used 3.5 grams;(5) pt smokes 4 grams of THC, daily, last use was 06/27/13.  Pt smoked 4 grams; (6) pt uses 5-10 pain pills(vicodin, percocet), daily, last use was 06/26/13. Pt used 2-3 pills; (7) pt drinks 1 case of beer and 1/5 of liquor, daily; his last use was 06/27/13, pt consumed 18 beers; (8) pt uses "1 shot" of  Molly's(MDMA), last use was 2 wks ago and (9) pt also uses 2 grams of LSD and mushrooms, last use was 6 mos ago, unsure of amt used.  Pt c/o w/d sxs: tremors, cramps hot/cold sweats and headaches.  Pt denies current legal actions.  Pt told this Clinical research associatewriter that he has alcohol induced seizures and says he has had 7 seizures in the past.  His last seizure was 4 days ago.     Axis I: Depressive Disorder NOS and Substance Abuse Axis II: Deferred Axis III:  Past Medical History  Diagnosis Date  . ETOH abuse   . Seizures   . Hip dislocation   . Depression   . Anxiety    Axis IV: other psychosocial or environmental problems, problems related to social environment and problems with primary support group Axis V: 41-50 serious symptoms  Past Medical History:  Past Medical History  Diagnosis Date  . ETOH abuse   . Seizures   . Hip  dislocation   . Depression   . Anxiety     History reviewed. No pertinent past surgical history.  Family History: History reviewed. No pertinent family history.  Social History:  reports that he has been smoking.  He has never used smokeless tobacco. He reports that he drinks alcohol. He reports that he uses illicit drugs (Marijuana and Cocaine).  Additional Social History:  Alcohol / Drug Use Pain Medications: None  Prescriptions: None  Over the Counter: None  History of alcohol / drug use?: Yes Longest period of sobriety (when/how long): When in detox  Negative Consequences of Use: Work / School;Personal relationships;Financial Substance #1 Name of Substance 1: Heroin 1 - Age of First Use: 42YOM  1 - Amount (size/oz): $100  1 - Frequency: Daily  1 - Duration: On-going  1 - Last Use / Amount: 2 Days Ago  Substance #2 Name of Substance 2: Opiates(Roxicodone, Oxycodone, Suboxone)  2 - Age of First Use: [age] YOM  2 - Amount (size/oz): 10 Pills  2 - Frequency: Daily  2 - Duration: On-going  2 - Last Use / Amount: 06/26/13 Substance #3 Name of Substance 3: Crack  3 - Age of First Use: 16 YOM  3 - Amount (  size/oz): "40 Bag"/2 Grams  3 - Frequency: Wkly  3 - Duration: On-going  3 - Last Use / Amount: 1 Wk Ago  Substance #4 Name of Substance 4: Cocaine (powder)  4 - Age of First Use: 16YOM  4 - Amount (size/oz): 3.5 Grams  4 - Frequency: Wkly  4 - Duration: On-going  4 - Last Use / Amount: 1 Wk Ago  Substance #5 Name of Substance 5: THC  5 - Age of First Use: Teens  5 - Amount (size/oz): 4 Grams  5 - Frequency: Daily  5 - Duration: On-going  5 - Last Use / Amount: 06/27/13 Substance #6 Name of Substance 6: Pain Pills(Percocet, Vicodin)  6 - Age of First Use: Teens  6 - Amount (size/oz): 5-10 Pills 6 - Frequency: Daily  6 - Duration: On-going  6 - Last Use / Amount: 06/26/13 Substance #7 Name of Substance 7: Benzos(Xanax)  7 - Age of First Use: 16YOM 7 - Amount  (size/oz): 5-10 Pills  7 - Frequency: Daily  7 - Duration: On-going  7 - Last Use / Amount: 06/26/13 Substance #8 Name of Substance 8: Alcohol  8 - Age of First Use: Teens  8 - Amount (size/oz): 1 Case; 1/5  8 - Frequency: Daily  8 - Duration: On-going  8 - Last Use / Amount: 06/27/13 Substance #9 Name of Substance 9: Molly's(MDMA)  9 - Age of First Use: 30's  9 - Amount (size/oz): "1 Shot" 9 - Frequency: Wkly 9 - Duration: On-going  9 - Last Use / Amount: 2 Wks Ago  Substance #10 Name of Substance 10: LSD/"shrooms" 7510 - Age of First Use: 30's  10 - Amount (size/oz): 2 Grams  10 - Frequency: Varies  10 - Duration: On-going  10 - Last Use / Amount: 6 Mos  CIWA: CIWA-Ar BP: 127/85 mmHg Pulse Rate: 107 COWS:    Allergies: No Known Allergies  Home Medications:  (Not in a hospital admission)  OB/GYN Status:  No LMP for male patient.  General Assessment Data Location of Assessment: [facility] Is this a Tele or Face-to-Face Assessment?: Face-to-Face Is this an Initial Assessment or a Re-assessment for this encounter?: Initial Assessment Living Arrangements: Alone Can pt return to current living arrangement?: Yes Admission Status: Voluntary Is patient capable of signing voluntary admission?: Yes Transfer from: Acute Hospital Referral Source: MD  [facility] Medical Exam completed: No Reason for MSE not completed: Other:  [facility] Living Arrangements: Alone Name of Psychiatrist: None  Name of Therapist: None   Education Status Is patient currently in school?: No Current Grade: None  Highest grade of school patient has completed: None  Name of school: None  Contact person: None   Risk to self Suicidal Ideation: No Suicidal Intent: No Is patient at risk for suicide?: No Suicidal Plan?: No Access to Means: No What has been your use of drugs/alcohol within the last 12 months?: Abusing: heroin, pain pills, cocaine/crack,  thc, benzos, alcohol Previous Attempts/Gestures: No How many times?: 0 Other Self Harm Risks: None  Triggers for Past Attempts: None known Intentional Self Injurious Behavior: None Family Suicide History: No Recent stressful life event(s): Other (Comment) (Chronic SA ) Persecutory voices/beliefs?: No Depression: Yes Depression Symptoms: Loss of interest in usual pleasures Substance abuse history and/or treatment for substance abuse?: Yes Suicide prevention information given to non-admitted patients: Not applicable  Risk to Others Homicidal Ideation: No Thoughts of Harm to Others: No Current Homicidal  Intent: No Current Homicidal Plan: No Access to Homicidal Means: No Identified Victim: None  History of harm to others?: No Assessment of Violence: None Noted Violent Behavior Description: None  Does patient have access to weapons?: No Criminal Charges Pending?: No Does patient have a court date: No  Psychosis Hallucinations: None noted Delusions: None noted  Mental Status Report Appear/Hygiene: Disheveled;In scrubs Eye Contact: Fair Motor Activity: Unremarkable Speech: Pressured;Logical/coherent Level of Consciousness: Alert Mood: Anxious;Depressed Affect: Anxious Anxiety Level: Minimal Thought Processes: Coherent;Relevant Judgement: Unimpaired Orientation: Person;Place;Time;Situation Obsessive Compulsive Thoughts/Behaviors: None  Cognitive Functioning Concentration: Normal Memory: Recent Intact;Remote Intact IQ: Average Insight: Fair Impulse Control: Fair Appetite: Fair Weight Loss: 0 Weight Gain: 0 Sleep: No Change Total Hours of Sleep: 4 Vegetative Symptoms: None  ADLScreening [facility] Assessment Services) Patient's cognitive ability adequate to safely complete daily activities?: Yes Patient able to express need for assistance with ADLs?: Yes Independently performs ADLs?: Yes (appropriate for developmental age)  Prior Inpatient Therapy Prior Inpatient  Therapy: Yes Prior Therapy Dates: 2014,2004,2003 Prior Therapy Facilty/Zahid Carneiro[facility]): [facility]  Reason for Treatment: Detox/Rehab   Prior Outpatient Therapy Prior Outpatient Therapy: No Prior Therapy Dates: None  Prior Therapy Facilty/Jaidynn Balster(s): None  Reason for Treatment: None   ADL Screening (condition at time of admission) Patient's cognitive ability adequate to safely complete daily activities?: Yes Is the patient deaf or have difficulty hearing?: No Does the patient have difficulty seeing, even when wearing glasses/contacts?: No Does the patient have difficulty concentrating, remembering, or making decisions?: No Patient able to express need for assistance with ADLs?: Yes Does the patient have difficulty dressing or bathing?: No Independently performs ADLs?: Yes (appropriate for developmental age) Does the patient have difficulty walking or climbing stairs?: No Weakness of Legs: None Weakness of Arms/Hands: None  Home Assistive Devices/Equipment Home Assistive Devices/Equipment: None  Therapy Consults (therapy consults require a physician order) PT Evaluation Needed: No OT Evalulation Needed: No SLP Evaluation Needed: No Abuse/Neglect Assessment (Assessment to be complete while patient is alone) Physical Abuse: Denies Verbal Abuse: Denies Sexual Abuse: Denies Exploitation of patient/patient's resources: Denies Self-Neglect: Denies Values / Beliefs Cultural Requests During Hospitalization: None Spiritual Requests During Hospitalization: None Consults Spiritual Care Consult Needed: No Social Work Consult Needed: No Merchant navy officer (For Healthcare) Advance Directive: Patient does not have advance directive;Patient would not like information Pre-existing out of facility DNR order (yellow form or pink MOST form): No Nutrition Screen- [facility] Patient's home diet: Regular  Additional Information 1:1 In Past 12 Months?: No CIRT Risk:  No Elopement Risk: No Does patient have medical clearance?: Yes     Disposition:  Disposition Initial Assessment Completed for this Encounter: Yes Disposition of Patient: Inpatient treatment program;Referred to [facility] ) Type of inpatient treatment program: Adult Patient referred to: Other (Comment) ([facility] )  Murrell Redden 06/28/2013 12:19 AM

## 2013-06-28 NOTE — BH Assessment (Signed)
This Clinical research associatewriter spoke with EDP Dr.Plunkett and requested and order for repeat BAL.  Dr. Anitra LauthPlunkett agreed to order repeat BAL.   Glorious PeachNajah Presley, MS, LCASA Assessment Counselor

## 2013-06-28 NOTE — ED Provider Notes (Signed)
17:00:  Pt seen and evaluated.  ETOH <11 at 11am.  Pt given 1 dose of PO Ativan, 2mg  at 12:00noon.  Pt Asymptomatic currently. No tremmor.  VS with Pulse 87.  SBP 137/.  Lucid, oriented.  No reported or observed hallucinations.  Per [facility], pt cannot be admitted due to history of seizures.  I reviewed last admit in 2013.  Pt presented on day 3 of ETOH withdrawal, agitated and hallucinating.  Given Geodon and admitted.  Pt given out patient referrals for detox treatment options.  I will Rx Ativan 1mg  tid x 5 days.  Also clonidine 0.1mg  bid x 5 days.   Discussed at length with patient.  He is agreeable to plan.  Rolland PorterMark James, MD 06/30/13 Moses [id number]

## 2013-06-28 NOTE — ED Provider Notes (Signed)
Medical screening examination/treatment/procedure(s) were performed by non-physician practitioner and as supervising physician I was immediately available for consultation/collaboration.   EKG Interpretation None       Doug SouSam Vilma Will, MD 06/28/13 0025

## 2013-06-28 NOTE — ED Notes (Signed)
Patient resting in position of comfort with eyes closed RR WNL--even and unlabored with equal rise and fall of chest Patient in NAD Side rails up, call bell in reach  

## 2013-06-28 NOTE — ED Notes (Signed)
Patient resting in position of comfort with eyes closed RR WNL--even and unlabored with equal rise and fall of chest Patient in NAD Side rails up 

## 2013-06-28 NOTE — ED Notes (Signed)
Patient had only liquids off of the breakfast tray.

## 2013-06-28 NOTE — BH Assessment (Signed)
This writer faxed rClinical research associateeferral to [facility] for review and consideration for inpatient detox treatment.  Glorious PeachNajah Presley, MS, LCASA Assessment Counselor

## 2013-06-28 NOTE — Discharge Instructions (Signed)
Alcohol Withdrawal    Emergency Department Resource Guide 1) Find a Doctor and Pay Out of Pocket Although you won't have to find out who is covered by your insurance plan, it is a good idea to ask around and get recommendations. You will then need to call the office and see if the doctor you have chosen will accept you as a new patient and what types of options they offer for patients who are self-pay. Some doctors offer discounts or will set up payment plans for their patients who do not have insurance, but you will need to ask so you aren't surprised when you get to your appointment.  2) Contact Your Local Health Department Not all health departments have doctors that can see patients for sick visits, but many do, so it is worth a call to see if yours does. If you don't know where your local health department is, you can check in your phone book. The CDC also has a tool to help you locate your state's health department, and many state websites also have listings of all of their local health departments.  3) Find a Walk-in Clinic If your illness is not likely to be very severe or complicated, you may want to try a walk in clinic. These are popping up all over the country in pharmacies, drugstores, and shopping centers. They're usually staffed by nurse practitioners or physician assistants that have been trained to treat common illnesses and complaints. They're usually fairly quick and inexpensive. However, if you have serious medical issues or chronic medical problems, these are probably not your best option.  No Primary Care Doctor: - Call Health Connect at  [phone] - they can help you locate a primary care doctor that  accepts your insurance, provides certain services, etc. - Physician Referral Service- [phone]  Chronic Pain Problems: Organization         Address  Phone   Notes  [facility]  [phone]) [phone] Patients need to be referred by their primary care  doctor.   Medication Assistance: Organization         Address  Phone   Notes  [facility] [address] [place], [place] [address] [phone]) [phone] --Must be a resident of [facility] -- Must have NO insurance coverage whatsoever (no Medicaid/ Medicare, etc.) -- The pt. MUST have a primary care doctor that directs their care regularly and follows them in the community   [facility]ist  [phone]   [employer]  [phone]    Agencies that provide inexpensive medical care: Organization         Address  Phone   Notes  [facility]  [phone]) [phone]   [facility]    [phone]) [phone]   [facility] [address] [place], [place] [address] [phone]   [facility] [place] [address], [place] [phone]   [facility]anned [facility]    [phone]   [facility]    [phone]   [facility] and [facility]  [address], [place] Phone:  [phone]) [phone], Fax:  [phone] Hours of Operation:  9 am - 6 pm, M-F.  Also accepts Medicaid/Medicare and self-pay.  [facility]  [address], [place] Phone: [phone], Fax: [phone]. Hours of Operation:  8:30 am - 5:30 pm, M-F.  Also accepts Medicaid and  self-pay.  [facility] [place] [address], [place] Phone: [phone]) [phone]   [facility] [address], [place], [place] [phone], Ext. [phone] Mondays & Thursdays: 7-9 AM.  First 15 patients are seen on a first come, first serve basis.    Medicaid-accepting [facility] Providers:  Organization         Address  Phone   Notes  [facility] [address], [place] [phone] Also accepts self-pay patients.  [facility] [address], [place]  [phone]) [phone]   [facility] [address], [place] [phone]) [phone]   [facility] [address] [place] [address], [place] [phone]   Renaye RakersVeita Bland [address], [place]   [phone] Only accepts [place] Access [place] patients after they have their name applied to their card.   Self-Pay (no insurance) in [facility]:  Organization         Address  Phone   Notes  Sickle Cell Patients, [facility] [address], [place] [phone]   [facility] [address], [place] [phone]) [phone]   [facility] [place]  [address], [place] [phone]   [facility]/Dr. Osei-Bonsu  [address] [place] [address], [place] or [phone], [place] [phone]) [phone] Phone number for both [place] and [place] locations is the same.  [facility] [address], [place] [phone]) [phone]   [facility] [address] [place] [address], [place] or [address] [phone](336) [phone]   [facility] [address], [place] [phone], phone; [phone]) [phone], fax Sees patients 1st and 3rd Saturday of every month.  Must not qualify for public or private insurance (i.e. Medicaid, Medicare, [place] Health Choice, Veterans' Benefits)  Household income should be no more than 200% of the poverty level The clinic cannot treat you if you are pregnant or think you are pregnant  Sexually transmitted diseases are not treated at the clinic.    Dental Care: Organization         Address  Phone  Notes  [facility] of [facility] [address], [place] [phone] Accepts children up to age 42 who are enrolled in [place] or [place] Health Choice; pregnant women with a Medicaid card; and children who have applied for Medicaid or [place] Health Choice, but were declined, whose parents can pay a reduced fee at time of  service.  [facility] [place]  [address], [place] [phone] Accepts children up to age 42 who are enrolled in [place] or [place] Health Choice; pregnant women with a Medicaid card; and children who have applied for Medicaid or  Health Choice, but were declined, whose parents can pay a reduced fee at time of service.  [facility]  [address], [place] [phone] Patients are seen by appointment only. Walk-ins are not accepted. [facility] will see patients 42 years of age and older. Monday - Tuesday (8am-5pm) Most Wednesdays (8:30-5pm) $30 per visit, cash only  [facility]  [address], [facility] [phone] Patients are seen by appointment only. Walk-ins are not accepted. [facility] will see patients 7818  years of age and older. One Wednesday Evening (Monthly: Volunteer Based).  $30 per visit, cash only  [employer]  [phone] for adults; Children under age 1, call Graduate Pediatric Dentistry at [phone]. Children aged 64-14, please call [phone] to request a pediatric application.  Dental services are provided in all areas of dental care including fillings, crowns and bridges, complete and partial dentures, implants, gum treatment, root canals, and extractions. Preventive care is also provided. Treatment is provided to both adults and children. Patients are selected via a lottery and there is often a waiting list.   [facility] [address] [place]  [phone] [email or web address]   [facility] [address] [place], [place] [phone], Ext. [phone] Second and Fourth Thursday of each month, opens at 6:30 AM; Clinic ends at 9 AM.  Patients are seen on a first-come first-served basis, and a limited number are seen during each clinic.   [facility]  [address] [place],  [place] [phone]   Eligibility Requirements You must have lived in [place], [place], or [place] counties for at least the last three months.   You cannot be eligible for state or federal sponsored [place], including [employer], [place], or [employer].   You generally cannot be eligible for healthcare insurance through your employer.    How to apply: Eligibility screenings are held every Tuesday and Wednesday afternoon from 1:00 pm until 4:00 pm. You do not need an appointment for the interview!  [facility] [address], [place], [place] [phone]   [facility] Health Department  [phone]   [facility] Health Department  [phone]   [facility] Health Department  [phone]    Behavioral Health Resources in the Community: Intensive Outpatient Programs Organization         Address  Phone  Notes  [facility] Services [address], [place], [place] [phone]   [facility] [address], [place], [place] [phone]   [facility] [address], [place], [place]  [phone]   [facility] [address]  [place], [place] [phone] or [phone]   Substance Abuse Resources Organization         Address  Phone  Notes  Al[facility] Drug Services  [phone]   [facility]  [phone]   [place]  [phone]   Floydene Flock  [phone]   Residential & Outpatient Substance Abuse Program  [phone]   Psychological Services Organization         Address  Phone  Notes  [facility]  [phone]   [facility] Services  [phone]   [facility] Mental Health [address] [place] [phone] or [phone]    Mobile Crisis Teams Organization         Address  Phone  Notes  [facility], Mobile Crisis Care Unit  [phone]   [facility]  [address] [place], [place] [phone]   Doristine Locks [address] [place] [phone]    Self-Help/Support Groups Organization         Address  Phone             Notes  [facility] [place] - variety of support groups  [phone] Call for more information  Narcotics Anonymous (NA), [facility] [address] [place]  2 meetings at this location   Residential Treatment Programs Organization         Address  Phone  Notes  [facility] [address]    [place]  [phone]   [facility]  [address], [place] [address], [place], [place] [phone]   [facility] [address] [place], [place] [phone] Admissions: 8am-3pm M-F  [facility] [address],    [place], [place] [phone]   [facility] [address] [place], [place], [place] [phone]   [facility] [address]  [place], [place] [phone]   [facility] - Intensive Outpatient [address], [place], [place] [phone]   [facility] ([facility] [address]  [place], [place] [phone] or [phone]   [facility] (RTS) [address], [place], [place] [phone] Accepts Medicaid  [facility] [address]  [place] [phone] Substance Abuse/Addiction Treatment   [facility] Organization         Address  Phone  Notes  [facility]  [phone]   Angie Fava, PhD [address] Ervin Knack [place], [place]   [phone] or [phone]   [facility] Behavioral   [address] [place], [place] [phone]   [facility] [address] [place], [place] [phone] Insurance/Medicaid/sponsorship through [facility] and Families [address]                                    [place], [place] [phone]  Therapy/tele-psych/case  [facility] [address] [place], [place] [phone]    Dr. Lolly Mustache  [phone]   [facility]  [employer] [facility] Dept. 1) [address] [place] 2) [address] [place] 3)  [address], [place] [phone]  [phone]   [facility] Child Abuse Hotline 503-068-1540 or [phone] (After Hours)       Anytime drug use is interfering with normal living activities it has become abuse. This includes problems with family and friends. Psychological dependence has developed when your mind tells you that the drug is needed. This is usually followed by physical dependence when a continuing increase of drugs are required to get the same feeling or "high." This is known as addiction or chemical dependency. A person's risk is much higher if there is a history of chemical dependency in the family. Mild Withdrawal Following Stopping Alcohol, When Addiction or Chemical Dependency Has Developed When a person has developed tolerance to alcohol, any sudden stopping of alcohol can cause uncomfortable physical symptoms. Most of the time these are mild and consist of tremors in the hands and increases in heart rate, breathing, and temperature. Sometimes these symptoms are associated with anxiety, panic attacks, and bad dreams. There may also be stomach upset. Normal sleep patterns are often interrupted with periods of inability to sleep (insomnia). This may last for 6 months. Because of this discomfort, many people choose to continue drinking to get rid of this discomfort and to try to feel normal. Severe Withdrawal with Decreased or No Alcohol Intake, When Addiction or Chemical Dependency Has Developed About five percent of alcoholics will develop signs of severe withdrawal when they stop using alcohol. One sign  of this is development of generalized seizures (convulsions). Other signs of this are  severe agitation and confusion. This may be associated with believing in things which are not real or seeing things which are not really there (delusions and hallucinations). Vitamin deficiencies are usually present if alcohol intake has been long-term. Treatment for this most often requires hospitalization and close observation. Addiction can only be helped by stopping use of all chemicals. This is hard but may save your life. With continual alcohol use, possible outcomes are usually loss of self respect and esteem, violence, and death. Addiction cannot be cured but it can be stopped. This often requires outside help and the care of professionals. Treatment centers are listed in the yellow pages under Cocaine, Narcotics, and Alcoholics Anonymous. Most hospitals and clinics can refer you to a specialized care center. It is not necessary for you to go through the uncomfortable symptoms of withdrawal. Your caregiver can provide you with medicines that will help you through this difficult period. Try to avoid situations, friends, or drugs that made it possible for you to keep using alcohol in the past. Learn how to say no. It takes a long period of time to overcome addictions to all drugs, including alcohol. There may be many times when you feel as though you want a drink. After getting rid of the physical addiction and withdrawal, you will have a lessening of the craving which tells you that you need alcohol to feel normal. Call your caregiver if more support is needed. Learn who to talk to in your family and among your friends so that during these periods you can receive outside help. Alcoholics Anonymous (AA) has helped many people over the years. To get further help, contact AA or call your caregiver, counselor, or clergyperson. Al-Anon and Alateen are support groups for friends and family members of an alcoholic. The people who love and care for an alcoholic often need help, too. For information about these  organizations, check your phone directory or call a local alcoholism treatment center.  SEEK IMMEDIATE MEDICAL CARE IF:   You have a seizure.  You have a fever.  You experience uncontrolled vomiting or you vomit up blood. This may be bright red or look like black coffee grounds.  You have blood in the stool. This may be bright red or appear as a black, tarry, bad-smelling stool.  You become lightheaded or faint. Do not drive if you feel this way. Have someone else drive you or call 027 for help.  You become more agitated or confused.  You develop uncontrolled anxiety.  You begin to see things that are not really there (hallucinate). Your caregiver has determined that you completely understand your medical condition, and that your mental state is back to normal. You understand that you have been treated for alcohol withdrawal, have agreed not to drink any alcohol for a minimum of 1 day, will not operate a car or other machinery for 24 hours, and have had an opportunity to ask any questions about your condition. Document Released: 10/09/2004 Document Revised: 03/24/2011 Document Reviewed: 08/18/2007 [facility] Patient Information 2014 [place], [place].  Delirium Tremens Delirium tremens is the worst form of alcohol withdrawal. It usually happens 2 to 4 days after the last drink, but it may occur up to 7 to 10 days after the last drink. It involves sudden and severe changes in your mental and nervous system. Many people keep drinking to get rid of the discomfort felt during delirium tremens.Symptoms  may last up to 7 days.Delirium tremens is a serious condition and can be life-threatening. Most people need treatment in a hospital or a detox unit for intravenous (IV) fluids, medicines to help with symptoms, and close monitoring by a trained staff.  CAUSES  Delirium tremens can occur when you suddenly quit or greatly reduce the amount of alcohol you normally drink.  SYMPTOMS   Fast heart  rate.  Elevated temperature.  High blood pressure.  Seizures.  Tremors.  Sweating.  Fast breathing.  Anxiety.  Lack of coordination.  Trouble thinking clearly.  Nausea.  Seeing, hearing, or feeling things that are not really there (hallucinations). DIAGNOSIS   The diagnosis is usually made from the history of alcohol consumption and the presence of symptoms.  Medical tests may be done to rule out other conditions that can act like alcohol withdrawal. TREATMENT   Medicines are usually prescribed to prevent withdrawal symptoms. Some people need a calming drug (sedative) for a week or more until they are done with their withdrawal.  Going to the hospital is necessary if you are seriously ill. You may be given:  Thiamine to prevent a serious brain disorder.  Multivitamins and folate.  IV fluids if you are dehydrated.  Minerals.  Sedatives if you are anxious, agitated, or cannot sleep. HOME CARE INSTRUCTIONS  After treatment, you must:  Follow all instructions from your caregiver very carefully.  Take all medicines as prescribed. If you cannot, call your caregiver right away.  Keep all appointments for further evaluation and counseling.  Not use drugs, alcohol, or any other mind-altering or mood-altering drugs.  Not operate a motor vehicle or hazardous machinery until your caregiver says it is okay. SEEK IMMEDIATE MEDICAL CARE IF:   You have a seizure (convulsions).  You become very shaky or agitated.  You have severe nausea and vomiting.  You throw up blood. This may be bright red or look like black coffee grounds.  You have blood in your stool. This may be bright red, bad smelling, or appear black and tarry.  You become lightheaded or faint. If you have any of the above symptoms, do not drive. Have someone else drive you, or call your local emergency services (911 in U.S.). FOR MORE INFORMATION  Treatment centers are listed in telephone book under:  Alcoholism and Addiction Treatment, Substance Abuse Treatment or Cocaine, Narcotics, and Alcoholics Anonymous. Most hospitals and clinics can refer you to a specialized care center.  The [place] government maintains a toll-free number for getting treatment referrals: 520-863-6204 or 567-834-4040 (TDD). They also maintain a website: http://findtreatment.RockToxic.pl. Other websites for more information are: www.mentalhealth.RockToxic.pl and GreatestFeeling.tn.  In [place], treatment centers are listed in the telephone book under each [place]. Listings are available under: Oncologist for [employer] or similar titles. Document Released: 12/30/2004 Document Revised: 03/24/2011 Document Reviewed: 03/27/2010 [facility] Patient Information 2014 [place], [place].  Heroin Abuse and Withdrawal Heroin is an illegal opiate (narcotic) drug. It is very addictive. Once the effects of the drug are felt, the need to use the drug again (known as craving) is strong. When the drug is suddenly stopped after using it on a regular basis, significant and painful physical withdrawal symptoms are experienced.  EFFECTS OF HEROIN USE AND ABUSE The drug produces a profound feeling of pleasure and relaxation (euphoria) that lasts for a short time. This is followed by sleepiness and then agitation with craving for more drugs. After a short time of regular use (days or weeks), dependence on  the drug is developed. This means the user will become ill without it (withdrawal) and needs to keep using the drug to function. This is how fast heroin abuse becomes physical dependence and addiction (chemical dependency). EFFECTS ON THE BRAIN Heroin is addictive because it activates regions of the brain that are responsible for producing both the pleasurable sensation of "reward" and physical dependence. Together, these actions account for the user's loss of control and the rapid development of drug dependence. HOW IT IS USED  Heroin is a drug that  is mostly taken by a shot with a needle (injection) in the vein. Using a needle can have harmful effects on the user. Often times, the needle is unclean (unsterile), the heroin is contaminated with cutting agents, or heroin is used in combination with other drugs such as alcohol or cocaine.  In recent years, the purity of street heroin has increased a lot allowing users to "snort" the drug into their noses where it is absorbed through the lining of the nasal passages. You can become addicted this way. Many IV heroin users report they started by snorting. RELATED PROBLEMS  Needle sharing can cause AIDS. The AIDS virus is carried in contaminated blood left in the needle, syringe or other drug-related equipment. When unclean needles are used, the AIDS virus is injected into the new user. There is no cure or proven vaccine for AIDS to prevent it.  Heroin use during pregnancy is associated with stillbirths. It can also increase the chance for miscarriage. Babies born addicted to heroin must undergo withdrawal after birth. These babies show a number of developmental problems.  Heroin use can cause serious health problems such as liver infection (hepatitis), skin abscesses, vein inflammation and heart disease. SYMPTOMS The signs and symptoms of heroin use include:  Euphoria.  Drowsiness.  Respiratory depression (which can progress until breathing stops).  Small (constricted) pupils and nausea.  Need for increasingly higher doses of the drug to get the same effect.  Weight loss.  "Track" marks (scars) on arms and legs.  Emotional instability. Symptoms of a heroin overdose include:   Shallow breathing.  Pinpoint pupils.  Clammy skin.  Convulsion.  Coma. WITHDRAWAL EFFECTS Withdrawal symptoms include:  Watery eyes.  Runny nose.  Yawning.  Loss of appetite.  Tremors.  Panic.  Chills.  Sweating.  Nausea.  Muscle cramps.  Not being able to sleep well  (insomnia).  Depression and mood swings.  Elevations in blood pressure, pulse, respiratory rate and temperature. RISK OF OVERDOSE Because the drug is produced illegally, heroin users risk taking an unusually potent dose (due to differences in purity). This can lead to overdose, coma and possible death. Especially dangerous is the situation where a user has been "clean" (abstinent) from using the drug for a period of time and then relapses. Fatal overdoses can occur because the user fails to account for the change in the body's tolerance to the drug. TREATMENT  There are many treatment options for heroin addiction. They include medications as well as behavioral therapies. Research has demonstrated that many people can successfully stop using heroin when medication is combined with other supportive services. Patients can return to stable and productive lives, living completely free of all narcotics. Some of the medications and other treatment approaches used are:  Methadone. This is a medication that blocks the effects of heroin for about 24 hours. It has a proven record of success when prescribed at a high enough dosage level for people addicted to heroin.  Naloxone may be used to treat cases of overdose, as well as naltrexone. Both of these block the effects of morphine, heroin and other opiates. People who have become free of narcotics often take naltrexone to avoid relapse.  Buprenorphine is now available for treating addiction to heroin and other opiates. This medication offers less risk of addiction. It can be dispensed in the privacy of a doctor's office.  Several other medications for use in heroin treatment programs are also under study.  There are many effective behavioral treatments available for heroin addiction. These can include residential and outpatient approaches. 12-step programs, such as Narcotics Anonymous, have also proven to be effective. HOME CARE INSTRUCTIONS  Some  individuals can successfully stop using heroin at home with medically assisted detoxification. This requires following your caregiver's instructions very carefully. This may include use of some of the following medications and therapies:  Stomach medicine can be used for the nausea.  Imodium can be used for the diarrhea.  Benadryl can be used for the sleeplessness and restlessness.  Anxiety medication such as Xanax or Ativan. These must be administered exactly as prescribed, usually with the help of a home caregiver.  Avoiding dehydration by drinking more fluids than usual. While water alone is fine, any non-alcoholic fluid is okay (soup, juice, etc.).  Providing quiet, calm support and cooling or warmth as needed.  Call your local emergency services (911 in [place] if seizures occur or if the patient is unable to keep liquids down.  Keep a written record of medications given and times given. Addiction cannot be cured but it can be treated successfully.   Treatment centers are listed in the yellow pages under:  Substance Abuse.  Narcotics Anonymous.  Drug Abuse Counseling.  Most hospitals and clinics can refer you to a specialized care center.  The [place] government maintains a toll-free number for obtaining treatment referrals:1-714-879-3888 or 959-062-6354 (TDD) and maintains a website: http://findtreatment.RockToxic.pl.  Other websites for additional information are:  www.mentalhealth.RockToxic.pl.  GreatestFeeling.tn.  In [place] treatment resources are listed in each [place] with listings available under:  Oncologist for [employer] or similar titles. Document Released: 01/02/2003 Document Revised: 03/24/2011 Document Reviewed: 12/17/2007 [facility] Patient Information 2014 [place], [place].

## 2013-06-28 NOTE — ED Provider Notes (Signed)
Medical screening examination/treatment/procedure(s) were performed by non-physician practitioner and as supervising physician I was immediately available for consultation/collaboration.   EKG Interpretation None       Doug SouSam Dawsyn Ramsaran, MD 06/28/13 [phone]

## 2013-06-28 NOTE — ED Notes (Signed)
Bed: WA31 Expected date:  Expected time:  Means of arrival:  Comments: TCU  

## 2013-06-28 NOTE — BH Assessment (Signed)
Pt declined at [facility] by Joni ReiningNicole due to history of seizure disorder.  Glorious PeachNajah Tamasha Laplante, MS, LCASA Assessment Counselor

## 2013-06-29 NOTE — Consult Note (Signed)
Face to face evaluation and I agree with this note 

## 2013-10-24 ENCOUNTER — Emergency Department (HOSPITAL_COMMUNITY): Payer: Self-pay

## 2013-10-24 ENCOUNTER — Encounter (HOSPITAL_COMMUNITY): Payer: Self-pay | Admitting: Emergency Medicine

## 2013-10-24 ENCOUNTER — Emergency Department (HOSPITAL_COMMUNITY)
Admission: EM | Admit: 2013-10-24 | Discharge: 2013-10-24 | Disposition: A | Discharge status: Home / Self Care | Payer: Self-pay | Attending: Emergency Medicine | Admitting: Emergency Medicine

## 2013-10-24 DIAGNOSIS — Z8669 Personal history of other diseases of the nervous system and sense organs: Secondary | ICD-10-CM | POA: Insufficient documentation

## 2013-10-24 DIAGNOSIS — Z8659 Personal history of other mental and behavioral disorders: Secondary | ICD-10-CM | POA: Insufficient documentation

## 2013-10-24 DIAGNOSIS — Z720 Tobacco use: Secondary | ICD-10-CM | POA: Insufficient documentation

## 2013-10-24 DIAGNOSIS — R109 Unspecified abdominal pain: Secondary | ICD-10-CM | POA: Insufficient documentation

## 2013-10-24 DIAGNOSIS — J439 Emphysema, unspecified: Secondary | ICD-10-CM

## 2013-10-24 DIAGNOSIS — M549 Dorsalgia, unspecified: Secondary | ICD-10-CM

## 2013-10-24 DIAGNOSIS — M545 Low back pain: Secondary | ICD-10-CM | POA: Insufficient documentation

## 2013-10-24 LAB — CBC WITH DIFFERENTIAL/PLATELET
BASOS PCT: 1 % (ref 0–1)
Basophils Absolute: 0.1 10*3/uL (ref 0.0–0.1)
EOS ABS: 0.1 10*3/uL (ref 0.0–0.7)
Eosinophils Relative: 1 % (ref 0–5)
HEMATOCRIT: 43.9 % (ref 39.0–52.0)
HEMOGLOBIN: 15.2 g/dL (ref 13.0–17.0)
Lymphocytes Relative: 12 % (ref 12–46)
Lymphs Abs: 1.3 10*3/uL (ref 0.7–4.0)
MCH: 31.3 pg (ref 26.0–34.0)
MCHC: 34.6 g/dL (ref 30.0–36.0)
MCV: 90.5 fL (ref 78.0–100.0)
MONO ABS: 2.1 10*3/uL — AB (ref 0.1–1.0)
MONOS PCT: 19 % — AB (ref 3–12)
Neutro Abs: 7.5 10*3/uL (ref 1.7–7.7)
Neutrophils Relative %: 67 % (ref 43–77)
Platelets: 229 10*3/uL (ref 150–400)
RBC: 4.85 MIL/uL (ref 4.22–5.81)
RDW: 13 % (ref 11.5–15.5)
WBC: 10.9 10*3/uL — ABNORMAL HIGH (ref 4.0–10.5)

## 2013-10-24 LAB — URINALYSIS, ROUTINE W REFLEX MICROSCOPIC
GLUCOSE, UA: 500 mg/dL — AB
HGB URINE DIPSTICK: NEGATIVE
Ketones, ur: NEGATIVE mg/dL
Leukocytes, UA: NEGATIVE
Nitrite: NEGATIVE
PROTEIN: NEGATIVE mg/dL
Specific Gravity, Urine: 1.023 (ref 1.005–1.030)
UROBILINOGEN UA: 4 mg/dL — AB (ref 0.0–1.0)
pH: 6 (ref 5.0–8.0)

## 2013-10-24 LAB — BASIC METABOLIC PANEL
Anion gap: 14 (ref 5–15)
BUN: 6 mg/dL (ref 6–23)
CO2: 26 mEq/L (ref 19–32)
CREATININE: 0.66 mg/dL (ref 0.50–1.35)
Calcium: 9 mg/dL (ref 8.4–10.5)
Chloride: 96 mEq/L (ref 96–112)
Glucose, Bld: 113 mg/dL — ABNORMAL HIGH (ref 70–99)
Potassium: 3.5 mEq/L — ABNORMAL LOW (ref 3.7–5.3)
Sodium: 136 mEq/L — ABNORMAL LOW (ref 137–147)

## 2013-10-24 MED ORDER — ALBUTEROL SULFATE (2.5 MG/3ML) 0.083% IN NEBU
5.0000 mg | INHALATION_SOLUTION | Freq: Once | RESPIRATORY_TRACT | Status: AC
  Administered 2013-10-24: 5 mg via RESPIRATORY_TRACT
  Filled 2013-10-24: qty 6

## 2013-10-24 MED ORDER — IPRATROPIUM BROMIDE 0.02 % IN SOLN
0.5000 mg | Freq: Once | RESPIRATORY_TRACT | Status: AC
  Administered 2013-10-24: 0.5 mg via RESPIRATORY_TRACT
  Filled 2013-10-24: qty 2.5

## 2013-10-24 MED ORDER — CYCLOBENZAPRINE HCL 10 MG PO TABS: 10.0000 mg | ORAL_TABLET | Freq: Two times a day (BID) | ORAL | Status: DC | PRN

## 2013-10-24 MED ORDER — NAPROXEN 500 MG PO TABS: 500.0000 mg | ORAL_TABLET | Freq: Two times a day (BID) | ORAL | Status: DC

## 2013-10-24 MED ORDER — HYDROCODONE-ACETAMINOPHEN 5-325 MG PO TABS
1.0000 | ORAL_TABLET | ORAL | Status: AC
  Administered 2013-10-24: 1 via ORAL
  Filled 2013-10-24: qty 1

## 2013-10-24 NOTE — ED Notes (Signed)
Pt reports sudden lower back pain without injury. Pt report "running nose all summer" but denies SOB, CP, or cough.

## 2013-10-24 NOTE — Discharge Instructions (Signed)
Back Pain, Adult Low back pain is very common. About 1 in 5 people have back pain.The cause of low back pain is rarely dangerous. The pain often gets better over time.About half of people with a sudden onset of back pain feel better in just 2 weeks. About 8 in 10 people feel better by 6 weeks.  CAUSES Some common causes of back pain include:  Strain of the muscles or ligaments supporting the spine.  Wear and tear (degeneration) of the spinal discs.  Arthritis.  Direct injury to the back. DIAGNOSIS Most of the time, the direct cause of low back pain is not known.However, back pain can be treated effectively even when the exact cause of the pain is unknown.Answering your caregiver's questions about your overall health and symptoms is one of the most accurate ways to make sure the cause of your pain is not dangerous. If your caregiver needs more information, he or she may order lab work or imaging tests (X-rays or MRIs).However, even if imaging tests show changes in your back, this usually does not require surgery. HOME CARE INSTRUCTIONS For many people, back pain returns.Since low back pain is rarely dangerous, it is often a condition that people can learn to manageon their own.   Remain active. It is stressful on the back to sit or stand in one place. Do not sit, drive, or stand in one place for more than 30 minutes at a time. Take short walks on level surfaces as soon as pain allows.Try to increase the length of time you walk each day.  Do not stay in bed.Resting more than 1 or 2 days can delay your recovery.  Do not avoid exercise or work.Your body is made to move.It is not dangerous to be active, even though your back may hurt.Your back will likely heal faster if you return to being active before your pain is gone.  Pay attention to your body when you bend and lift. Many people have less discomfortwhen lifting if they bend their knees, keep the load close to their bodies,and  avoid twisting. Often, the most comfortable positions are those that put less stress on your recovering back.  Find a comfortable position to sleep. Use a firm mattress and lie on your side with your knees slightly bent. If you lie on your back, put a pillow under your knees.  Only take over-the-counter or prescription medicines as directed by your caregiver. Over-the-counter medicines to reduce pain and inflammation are often the most helpful.Your caregiver may prescribe muscle relaxant drugs.These medicines help dull your pain so you can more quickly return to your normal activities and healthy exercise.  Put ice on the injured area.  Put ice in a plastic bag.  Place a towel between your skin and the bag.  Leave the ice on for 15-20 minutes, 03-04 times a day for the first 2 to 3 days. After that, ice and heat may be alternated to reduce pain and spasms.  Ask your caregiver about trying back exercises and gentle massage. This may be of some benefit.  Avoid feeling anxious or stressed.Stress increases muscle tension and can worsen back pain.It is important to recognize when you are anxious or stressed and learn ways to manage it.Exercise is a great option. SEEK MEDICAL CARE IF:  You have pain that is not relieved with rest or medicine.  You have pain that does not improve in 1 week.  You have new symptoms.  You are generally not feeling well. SEEK   IMMEDIATE MEDICAL CARE IF:   You have pain that radiates from your back into your legs.  You develop new bowel or bladder control problems.  You have unusual weakness or numbness in your arms or legs.  You develop nausea or vomiting.  You develop abdominal pain.  You feel faint. Document Released: 12/30/2004 Document Revised: 07/01/2011 Document Reviewed: 05/03/2013 [employer] Patient Information 2015 [employer]. This information is not intended to replace advice given to you by your health care provider. Make sure you  discuss any questions you have with your health care provider.  

## 2013-10-24 NOTE — ED Provider Notes (Signed)
CSN: [id number]     Arrival date & time 10/24/13  [id number] History   First MD Initiated Contact with Patient 10/24/13 1008     Chief Complaint  Patient presents with  . Back Pain     HPI Patient presents to the emergency room with complaints of left-sided lower back and flank pain that started a few days ago.. The patient states the pain is sharp. Sometimes it is severe. It increases with palpation movement and deep breathing. He has a chronic smokers cough but has not had any recent change. He denies any fevers. He does not have any shortness of breath. He has not noticed any leg swelling. No history of DVT or PE. No dysuria or blood in his urine. Past Medical History  Diagnosis Date  . ETOH abuse   . Seizures   . Hip dislocation   . Depression   . Anxiety    History reviewed. No pertinent past surgical history. No family history on file. History  Substance Use Topics  . Smoking status: Current Every Day Smoker  . Smokeless tobacco: Never Used  . Alcohol Use: Yes    Review of Systems  All other systems reviewed and are negative.     Allergies  Review of patient's allergies indicates no known allergies.  Home Medications   Prior to Admission medications   Medication Sig Start Date End Date Taking? Authorizing Provider  Magnesium Salicylate Tetrahyd (DOANS EXTRA STRENGTH) 580 MG TABS Take 2 tablets by mouth once.   Yes Historical Provider, MD  cyclobenzaprine (FLEXERIL) 10 MG tablet Take 1 tablet (10 mg total) by mouth 2 (two) times daily as needed for muscle spasms. 10/24/13   Linwood DibblesJon Macyn Shropshire, MD  naproxen (NAPROSYN) 500 MG tablet Take 1 tablet (500 mg total) by mouth 2 (two) times daily. 10/24/13   Linwood DibblesJon Enos Muhl, MD   BP 122/77  Pulse 66  Temp(Src) 98.8 F (37.1 C) (Oral)  Resp 16  SpO2 98% Physical Exam  Nursing note and vitals reviewed. Constitutional: He appears well-developed and well-nourished. No distress.  HENT:  Head: Normocephalic and atraumatic.  Right Ear:  External ear normal.  Left Ear: External ear normal.  Eyes: Conjunctivae are normal. Right eye exhibits no discharge. Left eye exhibits no discharge. No scleral icterus.  Neck: Neck supple. No tracheal deviation present.  Cardiovascular: Normal rate, regular rhythm and intact distal pulses.   Pulmonary/Chest: Effort normal and breath sounds normal. No stridor. No respiratory distress. He has no wheezes. He has no rales. He exhibits tenderness.  Tetanus palpation in the left lower costal margin posteriorly. Palpation reproduces the patient's pain, no crepitus or deformity  Abdominal: Soft. Bowel sounds are normal. He exhibits no distension. There is no tenderness. There is no rebound and no guarding.  Musculoskeletal: He exhibits no edema and no tenderness.  Neurological: He is alert. He has normal strength. No cranial nerve deficit (no facial droop, extraocular movements intact, no slurred speech) or sensory deficit. He exhibits normal muscle tone. He displays no seizure activity. Coordination normal.  Skin: Skin is warm and dry. No rash noted.  Psychiatric: He has a normal mood and affect.    ED Course  Procedures (including critical care time) Labs Review Labs Reviewed  CBC WITH DIFFERENTIAL - Abnormal; Notable for the following:    WBC 10.9 (*)    Monocytes Relative 19 (*)    Monocytes Absolute 2.1 (*)    All other components within normal limits  BASIC METABOLIC PANEL - Abnormal;  Notable for the following:    Sodium 136 (*)    Potassium 3.5 (*)    Glucose, Bld 113 (*)    All other components within normal limits  URINALYSIS, ROUTINE W REFLEX MICROSCOPIC - Abnormal; Notable for the following:    Color, Urine AMBER (*)    APPearance CLOUDY (*)    Glucose, UA 500 (*)    Bilirubin Urine SMALL (*)    Urobilinogen, UA 4.0 (*)    All other components within normal limits    Imaging Review Dg Chest 2 View  10/24/2013   CLINICAL DATA:  43 year old male with acute left posterior  chest pain for 2 days. Coughing with abnormal left side pulmonary auscultation. Initial encounter. Current history of smoking.  EXAM: CHEST  2 VIEW  COMPARISON:  Portable chest radiograph 04/27/2011.  FINDINGS: Upper limits of normal lung volumes. Normal cardiac size and mediastinal contours. Visualized tracheal air column is within normal limits. Chronic loss of lung markings in the left apex appears related to bullous emphysema. No pneumothorax, pulmonary edema, pleural effusion or confluent pulmonary opacity identified. No osseous abnormality identified.  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 No acute cardiopulmonary abnormality.   Electronically Signed   By: Augusto GambleLee  Hall M.D.   On: 10/24/2013 11:35     MDM   Final diagnoses:  Left-sided back pain, unspecified location   I have A low suspicion for PE. The patient is low risk and is PERC negative.  Patient has reproducible tenderness. I do not feel that D. dimer is necessary.  Labs and xrays unremarkable.   Will dc home with nsaids and muscle relaxants    Linwood DibblesJon Teighan Aubert, MD 10/24/13 1333

## 2013-10-24 NOTE — ED Notes (Signed)
Per patient, states lower back pain and nasal congestion-cough r/t cigarettes

## 2013-10-24 NOTE — ED Provider Notes (Signed)
Medical screening examination/treatment/procedure(s) were performed by non-physician practitioner and as supervising physician I was immediately available for consultation/collaboration.   EKG Interpretation None        Lyanne CoKevin M Kwynn Schlotter, MD 10/24/13 1540

## 2013-10-24 NOTE — ED Notes (Signed)
Pt attempting to void at present time.

## 2013-10-24 NOTE — ED Provider Notes (Signed)
MSE was initiated and I personally evaluated the patient and placed orders (if any) at  10:42 AM on October 24, 2013.  Pt here with pleuritic low back pain, 8/10, intermittent, worse with movement and deep inspiration, worse in the L posterior chest wall/back at the edge of the costal margin. No recent heavy lifting or strain, no twisting or trauma. Denies fevers, CP, or SOB. Lung sounds rhoncorous in the LLL posteriorly. Will order CXR, labs, and give nebs but decision made to upgrade pt to a higher acuity and move pt to the acute side in case pt needs other imaging. Dimer ordered given that Well's score was low, but if positive would need to obtain CTA for r/o of PE. Pt afebrile, but this could be early PNA. Pt has chronic cough (smoker), but recently sputum color changed (yellowish) and increased, therefore could be COPD exacerbation although pt doesn't have official COPD diagnosis. Pt will be seen in the back for further work up.  BP 134/76  Pulse 93  Temp(Src) 98.3 F (36.8 C) (Oral)  Resp 18  SpO2 99%   The patient appears stable so that the remainder of the MSE may be completed by another provider.  Donnita FallsMercedes Strupp [place], [place] JerseyPA-C 10/24/13 1046

## 2014-03-07 ENCOUNTER — Emergency Department (HOSPITAL_COMMUNITY)
Admission: EM | Admit: 2014-03-07 | Discharge: 2014-03-08 | Disposition: A | Discharge status: Home / Self Care | Payer: Federal, State, Local not specified - Other | Attending: Emergency Medicine | Admitting: Emergency Medicine

## 2014-03-07 ENCOUNTER — Encounter (HOSPITAL_COMMUNITY): Payer: Self-pay

## 2014-03-07 DIAGNOSIS — Z79899 Other long term (current) drug therapy: Secondary | ICD-10-CM | POA: Insufficient documentation

## 2014-03-07 DIAGNOSIS — F10230 Alcohol dependence with withdrawal, uncomplicated: Secondary | ICD-10-CM | POA: Insufficient documentation

## 2014-03-07 DIAGNOSIS — F1020 Alcohol dependence, uncomplicated: Secondary | ICD-10-CM | POA: Diagnosis present

## 2014-03-07 DIAGNOSIS — Z791 Long term (current) use of non-steroidal anti-inflammatories (NSAID): Secondary | ICD-10-CM | POA: Insufficient documentation

## 2014-03-07 DIAGNOSIS — F10930 Alcohol use, unspecified with withdrawal, uncomplicated: Secondary | ICD-10-CM

## 2014-03-07 DIAGNOSIS — F1110 Opioid abuse, uncomplicated: Secondary | ICD-10-CM | POA: Insufficient documentation

## 2014-03-07 DIAGNOSIS — F1210 Cannabis abuse, uncomplicated: Secondary | ICD-10-CM | POA: Insufficient documentation

## 2014-03-07 DIAGNOSIS — Z720 Tobacco use: Secondary | ICD-10-CM | POA: Insufficient documentation

## 2014-03-07 DIAGNOSIS — F1910 Other psychoactive substance abuse, uncomplicated: Secondary | ICD-10-CM

## 2014-03-07 LAB — COMPREHENSIVE METABOLIC PANEL
ALBUMIN: 4.6 g/dL (ref 3.5–5.2)
ALT: 22 U/L (ref 0–53)
AST: 36 U/L (ref 0–37)
Alkaline Phosphatase: 128 U/L — ABNORMAL HIGH (ref 39–117)
Anion gap: 14 (ref 5–15)
BUN: 6 mg/dL (ref 6–23)
CO2: 24 mmol/L (ref 19–32)
Calcium: 9.3 mg/dL (ref 8.4–10.5)
Chloride: 102 mmol/L (ref 96–112)
Creatinine, Ser: 0.64 mg/dL (ref 0.50–1.35)
GFR calc Af Amer: >90 mL/min (ref >90)
GFR calc non Af Amer: >90 mL/min (ref >90)
GLUCOSE: 95 mg/dL (ref 70–99)
POTASSIUM: 3.8 mmol/L (ref 3.5–5.1)
SODIUM: 140 mmol/L (ref 135–145)
Total Bilirubin: 0.7 mg/dL (ref 0.3–1.2)
Total Protein: 7.9 g/dL (ref 6.0–8.3)

## 2014-03-07 LAB — CBC
HCT: 49.3 % (ref 39.0–52.0)
HEMOGLOBIN: 16.9 g/dL (ref 13.0–17.0)
MCH: 30.4 pg (ref 26.0–34.0)
MCHC: 34.3 g/dL (ref 30.0–36.0)
MCV: 88.7 fL (ref 78.0–100.0)
Platelets: 340 10*3/uL (ref 150–400)
RBC: 5.56 MIL/uL (ref 4.22–5.81)
RDW: 13.2 % (ref 11.5–15.5)
WBC: 6.1 10*3/uL (ref 4.0–10.5)

## 2014-03-07 LAB — ACETAMINOPHEN LEVEL

## 2014-03-07 LAB — RAPID URINE DRUG SCREEN, HOSP PERFORMED
Amphetamines: NOT DETECTED
Barbiturates: NOT DETECTED
Benzodiazepines: NOT DETECTED
Cocaine: POSITIVE — AB
Opiates: NOT DETECTED
TETRAHYDROCANNABINOL: POSITIVE — AB

## 2014-03-07 LAB — SALICYLATE LEVEL: Salicylate Lvl: <4 mg/dL (ref 2.8–20.0)

## 2014-03-07 LAB — ETHANOL: Alcohol, Ethyl (B): 285 mg/dL — ABNORMAL HIGH (ref 0–9)

## 2014-03-07 MED ORDER — LORAZEPAM 1 MG PO TABS: 0.0000 mg | ORAL_TABLET | Freq: Two times a day (BID) | ORAL | Status: DC

## 2014-03-07 MED ORDER — IBUPROFEN 200 MG PO TABS: 600.0000 mg | ORAL_TABLET | Freq: Three times a day (TID) | ORAL | Status: DC | PRN

## 2014-03-07 MED ORDER — THIAMINE HCL 100 MG/ML IJ SOLN: 100.0000 mg | Freq: Every day | INTRAMUSCULAR | Status: DC

## 2014-03-07 MED ORDER — ALUM & MAG HYDROXIDE-SIMETH 200-200-20 MG/5ML PO SUSP: 30.0000 mL | ORAL | Status: DC | PRN

## 2014-03-07 MED ORDER — VITAMIN B-1 100 MG PO TABS
100.0000 mg | ORAL_TABLET | Freq: Every day | ORAL | Status: DC
  Administered 2014-03-07 – 2014-03-08 (×2): 100 mg via ORAL
  Filled 2014-03-07 (×2): qty 1

## 2014-03-07 MED ORDER — ONDANSETRON HCL 4 MG PO TABS
4.0000 mg | ORAL_TABLET | Freq: Three times a day (TID) | ORAL | Status: DC | PRN
  Administered 2014-03-07: 4 mg via ORAL
  Filled 2014-03-07: qty 1

## 2014-03-07 MED ORDER — LORAZEPAM 1 MG PO TABS
0.0000 mg | ORAL_TABLET | Freq: Four times a day (QID) | ORAL | Status: DC
  Administered 2014-03-07: 1 mg via ORAL
  Administered 2014-03-08: 2 mg via ORAL
  Administered 2014-03-08 (×2): 1 mg via ORAL
  Filled 2014-03-07 (×5): qty 1

## 2014-03-07 MED ORDER — NICOTINE 21 MG/24HR TD PT24
21.0000 mg | MEDICATED_PATCH | Freq: Every day | TRANSDERMAL | Status: DC
  Administered 2014-03-07 – 2014-03-08 (×2): 21 mg via TRANSDERMAL
  Filled 2014-03-07 (×2): qty 1
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
## 2014-03-07 NOTE — ED Notes (Signed)
Pt presents wanting detox from ETOH and opiates.  Last use, 12oz beer x 2 and 5th of vodka x 4 hrs ago and Percocet 5 yesterday.  Denies medical complaints.  Denies SI/HI/AV.

## 2014-03-07 NOTE — BH Assessment (Signed)
Writer informed TTS ([place]) of the consult.  

## 2014-03-07 NOTE — ED Notes (Signed)
2 pt belonging bags in locker #31

## 2014-03-07 NOTE — ED Provider Notes (Signed)
CSN: [id number]     Arrival date & time 03/07/14  1446 History   First MD Initiated Contact with Patient 03/07/14 1527     Chief Complaint  Patient presents with  . ETOH Detox   . Opiate Detox      (Consider location/radiation/quality/duration/timing/severity/associated sxs/prior Treatment) HPI Comments: Patient with history of alcohol abuse complicated by withdrawal seizures and delirium tremens (severe hallucinations) -- presents requesting help with alcohol detox as well as opiate detox. Patient states that he drank about a gallon of liquor yesterday. He has been drinking heavily for approximately 2 years. Patient also reports doing heroin almost daily as well. He denies any current medical complaints otherwise. Last drink was 4 hours ago. Patient went to [facility] mental health and was referred to the emergency department. He states that he does not eat when he is drinking heavily. He smokes 1.5 ppd. The onset of this condition was acute. The course is constant. Aggravating factors: none. Alleviating factors: none.    The history is provided by the patient.    Past Medical History  Diagnosis Date  . ETOH abuse   . Seizures   . Hip dislocation   . Depression   . Anxiety    History reviewed. No pertinent past surgical history. History reviewed. No pertinent family history. History  Substance Use Topics  . Smoking status: Current Every Day Smoker -- 2.00 packs/day    Types: Cigarettes  . Smokeless tobacco: Never Used  . Alcohol Use: Yes    Review of Systems  Constitutional: Negative for fever.  HENT: Negative for rhinorrhea and sore throat.   Eyes: Negative for redness.  Respiratory: Negative for cough.   Cardiovascular: Negative for chest pain.  Gastrointestinal: Negative for nausea, vomiting, abdominal pain and diarrhea.  Genitourinary: Negative for dysuria.  Musculoskeletal: Negative for myalgias.  Skin: Negative for rash.  Neurological: Negative for headaches.       Allergies  Review of patient's allergies indicates no known allergies.  Home Medications   Prior to Admission medications   Medication Sig Start Date End Date Taking? Authorizing Provider  cyclobenzaprine (FLEXERIL) 10 MG tablet Take 1 tablet (10 mg total) by mouth 2 (two) times daily as needed for muscle spasms. 10/24/13   Linwood Dibbles, MD  Magnesium Salicylate Tetrahyd (DOANS EXTRA STRENGTH) 580 MG TABS Take 2 tablets by mouth once.    Historical Provider, MD  naproxen (NAPROSYN) 500 MG tablet Take 1 tablet (500 mg total) by mouth 2 (two) times daily. 10/24/13   Linwood Dibbles, MD   BP 108/96 mmHg  Pulse 53  Temp(Src) 98 F (36.7 C) (Oral)  Resp 16  SpO2 97%   Physical Exam  Constitutional: He appears well-developed and well-nourished.  thin  HENT:  Head: Normocephalic and atraumatic.  Eyes: Conjunctivae are normal. Right eye exhibits no discharge. Left eye exhibits no discharge.  Neck: Normal range of motion. Neck supple.  Cardiovascular: Normal rate, regular rhythm and normal heart sounds.   Pulmonary/Chest: Effort normal and breath sounds normal.  Abdominal: Soft. There is no tenderness.  Neurological: He is alert.  Skin: Skin is warm and dry.  Psychiatric: He has a normal mood and affect.  Nursing note and vitals reviewed.   ED Course  Procedures (including critical care time) Labs Review Labs Reviewed  ACETAMINOPHEN LEVEL - Abnormal; Notable for the following:    Acetaminophen (Tylenol), Serum <10.0 (*)    All other components within normal limits  COMPREHENSIVE METABOLIC PANEL - Abnormal; Notable  for the following:    Alkaline Phosphatase 128 (*)    All other components within normal limits  ETHANOL - Abnormal; Notable for the following:    Alcohol, Ethyl (B) 285 (*)    All other components within normal limits  URINE RAPID DRUG SCREEN (HOSP PERFORMED) - Abnormal; Notable for the following:    Cocaine POSITIVE (*)    Tetrahydrocannabinol POSITIVE (*)    All  other components within normal limits  CBC  SALICYLATE LEVEL    Imaging Review No results found.   EKG Interpretation None       3:34 PM Patient seen and examined. Work-up initiated. Holding orders completed. No clinical withdrawal at this time.   Vital signs reviewed and are as follows: BP 108/96 mmHg  Pulse 53  Temp(Src) 98 F (36.7 C) (Oral)  Resp 16  SpO2 97%  4:54 PM Patient is medically cleared. Pending TTS evaluation.   8:31 PM Patient has been evaluated by TTS. Plan is to keep in ED and monitor overnight and decide further plan in AM.   MDM   Final diagnoses:  Polysubstance abuse  Alcohol withdrawal, uncomplicated   Pending placement. TTS eval complete. On CIWA.     Renne CriglerJoshua Shatina Streets, PA-C 03/07/14 2033  Enid SkeensJoshua M Zavitz, MD 03/08/14 [phone]

## 2014-03-07 NOTE — Progress Notes (Signed)
  CARE MANAGEMENT ED NOTE 03/07/2014  Patient:  Desmond LopeURNER,Khayri L   Account Number:  [id number]  Date Initiated:  03/07/2014  Documentation initiated by:  Radford PaxFERRERO,Beaux Wedemeyer  Subjective/Objective Assessment:   patent presents to Ed with wanting detox from opiates and ETOH     Subjective/Objective Assessment Detail:   Patient with pmhx of seizures, depression, alcohol abuse, anxiety hip dislocation     Action/Plan:   Action/Plan Detail:   Anticipated DC Date:       Status Recommendation to Physician:   Result of Recommendation:    Other ED Services  Consult Working Plan    DC Planning Services  Other  PCP issues    Choice offered to / List presented to:            Status of service:  Completed, signed off  ED Comments:   ED Comments Detail:  EDCM spoke to patient at bedside. Patient confirms he does not have a pcp or [place]. EDCM [facility], informed patient of services there and walk in times.  EDCM also provided patient with list of pcps who accept self pay patients, list of discount pharmacies and websites needymeds.org and GoodRX.com for medication assistance, phone number to inquire about the orange card, phone number to inquire about Mediciad, phone number to inquire about the Affordable Care Act, financial resources in the community such as local churches, [employer], urban ministries, and dental assistance for uninsured patients. Patient thankful for resources.  No further EDCM needs at this time.

## 2014-03-07 NOTE — ED Notes (Signed)
Bed: [id number] Expected date:  Expected time:  Means of arrival:  Comments: Hold for 31

## 2014-03-08 ENCOUNTER — Inpatient Hospital Stay (HOSPITAL_COMMUNITY)
Admission: AD | Admit: 2014-03-08 | Discharge: 2014-03-14 | DRG: 897 | Disposition: A | Discharge status: Home / Self Care | Payer: Federal, State, Local not specified - Other | Source: Intra-hospital | Attending: Psychiatry | Admitting: Psychiatry

## 2014-03-08 ENCOUNTER — Encounter (HOSPITAL_COMMUNITY): Payer: Self-pay

## 2014-03-08 DIAGNOSIS — F10230 Alcohol dependence with withdrawal, uncomplicated: Secondary | ICD-10-CM | POA: Diagnosis present

## 2014-03-08 DIAGNOSIS — F1994 Other psychoactive substance use, unspecified with psychoactive substance-induced mood disorder: Secondary | ICD-10-CM | POA: Diagnosis present

## 2014-03-08 DIAGNOSIS — F419 Anxiety disorder, unspecified: Secondary | ICD-10-CM | POA: Diagnosis present

## 2014-03-08 DIAGNOSIS — F10930 Alcohol use, unspecified with withdrawal, uncomplicated: Secondary | ICD-10-CM | POA: Diagnosis present

## 2014-03-08 DIAGNOSIS — F1120 Opioid dependence, uncomplicated: Secondary | ICD-10-CM | POA: Diagnosis present

## 2014-03-08 DIAGNOSIS — F1020 Alcohol dependence, uncomplicated: Secondary | ICD-10-CM | POA: Diagnosis present

## 2014-03-08 DIAGNOSIS — M25512 Pain in left shoulder: Secondary | ICD-10-CM | POA: Diagnosis present

## 2014-03-08 DIAGNOSIS — F1910 Other psychoactive substance abuse, uncomplicated: Secondary | ICD-10-CM | POA: Diagnosis present

## 2014-03-08 DIAGNOSIS — F332 Major depressive disorder, recurrent severe without psychotic features: Secondary | ICD-10-CM | POA: Diagnosis present

## 2014-03-08 DIAGNOSIS — F1024 Alcohol dependence with alcohol-induced mood disorder: Secondary | ICD-10-CM | POA: Diagnosis present

## 2014-03-08 DIAGNOSIS — G4700 Insomnia, unspecified: Secondary | ICD-10-CM | POA: Diagnosis present

## 2014-03-08 DIAGNOSIS — F17210 Nicotine dependence, cigarettes, uncomplicated: Secondary | ICD-10-CM | POA: Diagnosis present

## 2014-03-08 MED ORDER — CHLORDIAZEPOXIDE HCL 25 MG PO CAPS
25.0000 mg | ORAL_CAPSULE | Freq: Three times a day (TID) | ORAL | Status: DC
  Administered 2014-03-10 (×2): 25 mg via ORAL
  Filled 2014-03-08 (×2): qty 1

## 2014-03-08 MED ORDER — ALUM & MAG HYDROXIDE-SIMETH 200-200-20 MG/5ML PO SUSP: 30.0000 mL | ORAL | Status: DC | PRN

## 2014-03-08 MED ORDER — CHLORDIAZEPOXIDE HCL 25 MG PO CAPS: 25.0000 mg | ORAL_CAPSULE | Freq: Four times a day (QID) | ORAL | Status: DC | PRN

## 2014-03-08 MED ORDER — LOPERAMIDE HCL 2 MG PO CAPS: 2.0000 mg | ORAL_CAPSULE | ORAL | Status: AC | PRN

## 2014-03-08 MED ORDER — MAGNESIUM HYDROXIDE 400 MG/5ML PO SUSP
30.0000 mL | Freq: Every day | ORAL | Status: DC | PRN
Start: 2014-03-08 — End: 2014-03-14

## 2014-03-08 MED ORDER — TRAZODONE HCL 50 MG PO TABS
50.0000 mg | ORAL_TABLET | Freq: Every evening | ORAL | Status: DC | PRN
  Administered 2014-03-08 – 2014-03-09 (×2): 50 mg via ORAL
  Filled 2014-03-08 (×6): qty 1

## 2014-03-08 MED ORDER — VITAMIN B-1 100 MG PO TABS
100.0000 mg | ORAL_TABLET | Freq: Every day | ORAL | Status: DC
  Administered 2014-03-09 – 2014-03-11 (×3): 100 mg via ORAL
  Filled 2014-03-08 (×7): qty 1

## 2014-03-08 MED ORDER — CHLORDIAZEPOXIDE HCL 25 MG PO CAPS: 25.0000 mg | ORAL_CAPSULE | ORAL | Status: DC

## 2014-03-08 MED ORDER — THIAMINE HCL 100 MG/ML IJ SOLN: 100.0000 mg | Freq: Once | INTRAMUSCULAR | Status: DC

## 2014-03-08 MED ORDER — CHLORDIAZEPOXIDE HCL 25 MG PO CAPS: 25.0000 mg | ORAL_CAPSULE | Freq: Every day | ORAL | Status: DC

## 2014-03-08 MED ORDER — ONDANSETRON 4 MG PO TBDP
4.0000 mg | ORAL_TABLET | Freq: Four times a day (QID) | ORAL | Status: AC | PRN
Start: 2014-03-08 — End: 2014-03-11
  Administered 2014-03-10: 4 mg via ORAL
  Filled 2014-03-08: qty 1

## 2014-03-08 MED ORDER — CHLORDIAZEPOXIDE HCL 25 MG PO CAPS
25.0000 mg | ORAL_CAPSULE | Freq: Four times a day (QID) | ORAL | Status: AC
  Administered 2014-03-08 – 2014-03-09 (×5): 25 mg via ORAL
  Filled 2014-03-08 (×5): qty 1

## 2014-03-08 MED ORDER — NAPROXEN 500 MG PO TABS
500.0000 mg | ORAL_TABLET | Freq: Two times a day (BID) | ORAL | Status: DC | PRN
  Administered 2014-03-08 – 2014-03-10 (×3): 500 mg via ORAL
  Filled 2014-03-08 (×3): qty 1

## 2014-03-08 MED ORDER — ACETAMINOPHEN 325 MG PO TABS
650.0000 mg | ORAL_TABLET | Freq: Four times a day (QID) | ORAL | Status: DC | PRN
  Administered 2014-03-09: 650 mg via ORAL
  Filled 2014-03-08: qty 2

## 2014-03-08 MED ORDER — HYDROXYZINE HCL 25 MG PO TABS: 25.0000 mg | ORAL_TABLET | Freq: Four times a day (QID) | ORAL | Status: AC | PRN

## 2014-03-08 MED ORDER — ADULT MULTIVITAMIN W/MINERALS CH
1.0000 | ORAL_TABLET | Freq: Every day | ORAL | Status: DC
  Administered 2014-03-09 – 2014-03-14 (×6): 1 via ORAL
  Filled 2014-03-08 (×7): qty 1

## 2014-03-08 MED ORDER — CYCLOBENZAPRINE HCL 10 MG PO TABS
10.0000 mg | ORAL_TABLET | Freq: Two times a day (BID) | ORAL | Status: DC | PRN
  Administered 2014-03-09 – 2014-03-10 (×2): 10 mg via ORAL
  Filled 2014-03-08 (×2): qty 1

## 2014-03-08 NOTE — ED Notes (Signed)
Pt transported to [facility] by [facility] transportation service for continuation of specialized care. Belongings given to driver after patient signed for them. He left in no acute distress. 

## 2014-03-08 NOTE — Consult Note (Signed)
[facility]   Reason for Consult:  Alcohol use disorder Referring Physician:  EDP Patient Identification: Desmond Lopenthony L Paule MRN:  [id number] Principal Diagnosis: Alcohol use disorder, severe, dependence Diagnosis:   Patient Active Problem List   Diagnosis Date Noted  . Alcohol use disorder, severe, dependence [F10.20] 03/08/2014    Priority: High  . Alcohol abuse [F10.10] 06/28/2013  . Alcohol withdrawal [F10.239] 06/28/2013  . DTs (delirium tremens) [F10.231] 06/28/2013  . Marijuana abuse [F12.10] 04/28/2011  . Alcoholism [F10.20] 04/27/2011  . Toxic encephalopathy [G92] 04/27/2011  . Psychosis [F29] 04/27/2011  . Leukocytosis [D72.829] 04/27/2011  . Hyponatremia [E87.1] 04/27/2011  . Thrombocytopenia [D69.6] 04/27/2011  . Elevated AST (SGOT) [R74.0] 04/27/2011  . Elevated bilirubin [R17] 04/27/2011    Total Time spent with patient: 45 minutes  Subjective:   Desmond Lopenthony L Emanuelson is a 44 y.o. male patient admitted with Alcohol use disorder.  HPI: Caucasian male, 44 years old was evaluated for Alcohol dependence and intoxication.  Patient admitted to drinking for most of his life and stated that he drinks any amount he can get.  Patient reported Alcohol withdrawal seizures in the past  and reported extensive tremors and shakes.  Today she reported feeling nauseated, anxious, excessive tremors and was physically shaking during the interview.  Patient reported that his last drink was yesterday.  He denied any MH diagnosis or taking any medications.  Patient reported that his last withdrawal seizures was last year.  He denies SI/HI/AVH.  He has been accepted for detox and a bed is assigned.  We have already started him on our Ativan detox protocol.   HPI Elements:   Location:  Alcohol use disorder. Quality:  anxiety, tremors, nausea, hx of previous withdrawal seizures. Severity:  severe. Timing:  acute. Duration:  Chronic Alcoholism. Context:  Seeking detox  treatment from Alcohol.  Past Medical History:  Past Medical History  Diagnosis Date  . ETOH abuse   . Seizures   . Hip dislocation   . Depression   . Anxiety    History reviewed. No pertinent past surgical history. Family History: History reviewed. No pertinent family history. Social History:  History  Alcohol Use  . Yes     History  Drug Use  . Yes  . Special: Marijuana, Cocaine    Comment: heroine, narcotics    History   Social History  . Marital Status: Married    Spouse Name: N/A  . Number of Children: N/A  . Years of Education: N/A   Social History Main Topics  . Smoking status: Current Every Day Smoker -- 2.00 packs/day    Types: Cigarettes  . Smokeless tobacco: Never Used  . Alcohol Use: Yes  . Drug Use: Yes    Special: Marijuana, Cocaine     Comment: heroine, narcotics  . Sexual Activity: Not on file   Other Topics Concern  . None   Social History Narrative   Additional Social History:    Pain Medications: SEE MAR Prescriptions: SEE MAR Over the Counter: SEE MAR Name of Substance 1: Alcoholism (beer) 1 - Age of First Use: 44 yrs old  1 - Amount (size/oz): 12 pack of beer  1 - Frequency: daily  1 - Duration: on-going  1 - Last Use / Amount: "This morning" Name of Substance 2: Opiates "K8's Dilaudid, Perc 5's, Roxi's, etc.) 2 - Age of First Use: 44 yrs old  2 - Amount (size/oz): varies; took 30 Dilaudid pills last week; other pills the amount  varies 2 - Frequency: daily  2 - Duration: on-going  2 - Last Use / Amount: 1 week ago Dilaidid; other opiates used daiy and last use today Name of Substance 3: Crack Cocaine 3 - Age of First Use: 44 yrs old  3 - Amount (size/oz): varies; approx. "$20 roll" 3 - Frequency: daily  3 - Duration: 7 days 3 - Last Use / Amount: 03/06/2014 Name of Substance 4: THC 4 - Age of First Use: 44 yrs old  4 - Amount (size/oz): varies  4 - Frequency: daily  4 - Duration: 20ys daily  4 - Last Use / Amount: this  morning 03/07/2014; several grams             Allergies:  No Known Allergies  Vitals: Blood pressure 142/84, pulse 102, temperature 98 F (36.7 C), temperature source Oral, resp. rate 18, SpO2 100 %.  Risk to Self: Suicidal Ideation: No-Not Currently/Within Last 6 Months Suicidal Intent:  (patient suicidal last Thursday; attempted to OD on Crack Coc) Is patient at risk for suicide?: No Suicidal Plan?: No-Not Currently/Within Last 6 Months (no current suicidal thoughts; pt OD on cocaine last week ) Specify Current Suicidal Plan:  (no current SI; Overdosed on cocaine last week (Thursday)) Access to Means: Yes Specify Access to Suicidal Means:  (Accessto meds) What has been your use of drugs/alcohol within the last 12 months?:  (patient calm and cooperative ) How many times?:  (Multiple x's) Other Self Harm Risks:  (none reported ) Triggers for Past Attempts: Other (Comment) (patient reports past attempts due to depression ) Intentional Self Injurious Behavior: None Risk to Others: Homicidal Ideation: No Thoughts of Harm to Others: No Current Homicidal Intent: No Current Homicidal Plan: No Access to Homicidal Means: No Identified Victim:  (n/a) History of harm to others?: No Assessment of Violence: None Noted Violent Behavior Description:  (patiient calm and cooperative ) Does patient have access to weapons?: No Criminal Charges Pending?: No Does patient have a court date: No Prior Inpatient Therapy: Prior Inpatient Therapy: Yes ([facility], [facility], [facility], ) Prior Therapy Dates:  (patient unable to recall exact dates) Prior Therapy Facilty/Provider(s):  ([facility] reports [facility], [facility], [facility], [facility]) Reason for Treatment:  (depression ) Prior Outpatient Therapy: Prior Outpatient Therapy: No Prior Therapy Dates:  (n/a) Prior Therapy Facilty/Provider(s):  (n/a) Reason for Treatment:  (n/a)  Current Facility-Administered Medications  Medication Dose Route  Frequency Provider Last Rate Last Dose  . alum & mag hydroxide-simeth (MAALOX/MYLANTA) 200-200-20 MG/5ML suspension 30 mL  30 mL Oral PRN Renne Crigler, PA-C      . ibuprofen (ADVIL,MOTRIN) tablet 600 mg  600 mg Oral Q8H PRN Renne Crigler, PA-C      . LORazepam (ATIVAN) tablet 0-4 mg  0-4 mg Oral 4 times per day Renne Crigler, PA-C   1 mg at 03/08/14 1305   Followed by  . [START ON 03/09/2014] LORazepam (ATIVAN) tablet 0-4 mg  0-4 mg Oral Q12H Renne Crigler, PA-C      . nicotine (NICODERM CQ - dosed in mg/24 hours) patch 21 mg  21 mg Transdermal Daily Renne Crigler, PA-C   21 mg at 03/08/14 1305  . ondansetron (ZOFRAN) tablet 4 mg  4 mg Oral Q8H PRN Renne Crigler, PA-C   4 mg at 03/07/14 1934  . thiamine (VITAMIN B-1) tablet 100 mg  100 mg Oral Daily Renne Crigler, PA-C   100 mg at 03/08/14 1304   Or  . thiamine (B-1) injection 100  mg  100 mg Intravenous Daily Renne Crigler, PA-C       Current Outpatient Prescriptions  Medication Sig Dispense Refill  . cyclobenzaprine (FLEXERIL) 10 MG tablet Take 1 tablet (10 mg total) by mouth 2 (two) times daily as needed for muscle spasms. (Patient not taking: Reported on 03/07/2014) 20 tablet 0  . Magnesium Salicylate Tetrahyd (DOANS EXTRA STRENGTH) 580 MG TABS Take 2 tablets by mouth once.    . naproxen (NAPROSYN) 500 MG tablet Take 1 tablet (500 mg total) by mouth 2 (two) times daily. (Patient not taking: Reported on 03/07/2014) 30 tablet 0    Musculoskeletal: Strength & Muscle Tone: within normal limits Gait & Station: normal Patient leans: N/A  Psychiatric Specialty Exam:     Blood pressure 142/84, pulse 102, temperature 98 F (36.7 C), temperature source Oral, resp. rate 18, SpO2 100 %.There is no weight on file to calculate BMI.  General Appearance: Casual and Disheveled  Eye Contact::  Good  Speech:  Clear and Coherent and Normal Rate  Volume:  Normal  Mood:  Anxious  Affect:  Congruent  Thought Process:  Coherent, Goal Directed and Intact   Orientation:  Full (Time, Place, and Person)  Thought Content:  WDL  Suicidal Thoughts:  No  Homicidal Thoughts:  No  Memory:  Immediate;   Good Recent;   Good Remote;   Good  Judgement:  Good  Insight:  Good  Psychomotor Activity:  Tremor  Concentration:  Good  Recall:  NA  Fund of Knowledge:Good  Language: Good  Akathisia:  NA  Handed:  Right  AIMS (if indicated):     Assets:  Desire for Improvement  ADL's:  Impaired  Cognition: WNL  Sleep:      Medical Decision Making: Established Problem, Worsening (2)  Treatment Plan Summary: Daily contact with patient to assess and evaluate symptoms and progress in treatment, Medication management and Plan Admitted, bed is assigned  Plan:  Recommend psychiatric Inpatient admission when medically cleared. Disposition: [facility]   PMHNP-BC 03/08/2014 3:22 PM Patient seen face-to-face for psychiatric evaluation, chart reviewed and case discussed with the physician extender and developed treatment plan. Reviewed the information documented and agree with the treatment plan. Thedore Mins, MD

## 2014-03-09 ENCOUNTER — Encounter (HOSPITAL_COMMUNITY): Payer: Self-pay | Admitting: Registered Nurse

## 2014-03-09 DIAGNOSIS — F1910 Other psychoactive substance abuse, uncomplicated: Secondary | ICD-10-CM | POA: Diagnosis present

## 2014-03-09 DIAGNOSIS — F1120 Opioid dependence, uncomplicated: Secondary | ICD-10-CM | POA: Diagnosis present

## 2014-03-09 DIAGNOSIS — F1024 Alcohol dependence with alcohol-induced mood disorder: Secondary | ICD-10-CM | POA: Diagnosis present

## 2014-03-09 LAB — TSH: TSH: 6.299 u[IU]/mL — ABNORMAL HIGH (ref 0.350–4.500)

## 2014-03-09 MED ORDER — CLONIDINE HCL 0.1 MG PO TABS
0.1000 mg | ORAL_TABLET | Freq: Four times a day (QID) | ORAL | Status: AC
  Administered 2014-03-09 – 2014-03-11 (×9): 0.1 mg via ORAL
  Filled 2014-03-09 (×11): qty 1

## 2014-03-09 MED ORDER — NICOTINE 21 MG/24HR TD PT24
21.0000 mg | MEDICATED_PATCH | Freq: Every day | TRANSDERMAL | Status: DC
  Administered 2014-03-09 – 2014-03-14 (×3): 21 mg via TRANSDERMAL
  Filled 2014-03-09 (×7): qty 1

## 2014-03-09 MED ORDER — CLONIDINE HCL 0.1 MG PO TABS
0.1000 mg | ORAL_TABLET | Freq: Every day | ORAL | Status: DC
  Administered 2014-03-14: 0.1 mg via ORAL
  Filled 2014-03-09: qty 1

## 2014-03-09 MED ORDER — CLONIDINE HCL 0.1 MG PO TABS
0.1000 mg | ORAL_TABLET | ORAL | Status: AC
  Administered 2014-03-12 – 2014-03-13 (×4): 0.1 mg via ORAL
  Filled 2014-03-09 (×6): qty 1

## 2014-03-09 MED ORDER — DICYCLOMINE HCL 20 MG PO TABS
20.0000 mg | ORAL_TABLET | Freq: Four times a day (QID) | ORAL | Status: DC | PRN
  Administered 2014-03-10: 20 mg via ORAL
  Filled 2014-03-09: qty 1

## 2014-03-09 NOTE — Tx Team (Signed)
Interdisciplinary Treatment Plan Update (Adult)   Date: 03/09/2014   Time Reviewed: 8:39 AM  Progress in Treatment:  Attending groups: No-new to unit.  Participating in groups:   No-new to unit.  Taking medication as prescribed: Yes  Tolerating medication: Yes  Family/Significant othe contact made: Not yet. SPE required for this pt.  Patient understands diagnosis: Yes, AEB seeking treatment for ETOH detox/opiate detox, depression, passive SI, and for medication stabilization.  Discussing patient identified problems/goals with staff: Yes  Medical problems stabilized or resolved: Yes  Denies suicidal/homicidal ideation: Yes during self report.  Patient has not harmed self or Others: Yes  New problem(s) identified:  Discharge Plan or Barriers: CSW asssessing for appropriate referrals at this time.  Additional comments: Jake [name] is an 44 y.o. male with history of depression, anxiety, and polysubstance abuse. Per ED notes, patient with history of alcohol abuse complicated by withdrawal seizures and delirium tremens (severe hallucinations). [name] as well as opiate detox. Patient states that he drank about a 1/2 fifth of liquor yesterday. He has been drinking heavily for a few years. Patient also reports doing opiate and THC use almost daily as well.  Patient denies current SI but reports making a intentional suicide last Thursday. Patient attempted to overdose on cocaine. Sts that he had to be revived by a friend. Patient has made many previous suicide attempts stating, "I did things to put myself in a dangerous position". Patient reports depressive and anxiety symptoms. Sts that his girlfriend recently died. No HI and AVH's.Patient hospitalized many times at [facility], [facility], [facility], and etc. Sts that he doesn't have a current outpatient mental health provider. Reason for Continuation of Hospitalization: Librium  taper-withdrawals Depression Medication stabilization Estimated length of stay: 3-5 days  For review of initial/current patient goals, please see plan of care.  Attendees:  Patient:    Family:    Physician: Geoffery LyonsIrving Lugo MD 03/09/2014 8:43 AM   Nursing: Felicity PellegriniJennifer, Ronecia, Donna RN 03/09/2014 8:43 AM   Clinical Social Worker Ashani Pumphrey Smart, LCSWA  03/09/2014 8:43 AM   Other: Gerilyn PilgrimQuylle H. Veronia BeetsLCSW; Kristin D. LCSW 03/09/2014 8:43 AM   Other: Darden DatesJennifer C. Nurse CM 03/09/2014 8:43 AM   Other: Liliane Badeolora Sutton, Community Care Coordinator  03/09/2014 8:43 AM   Other:    Scribe for Treatment Team:  Trula SladeHeather Smart LCSWA 03/09/2014 8:43 AM
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
## 2014-03-09 NOTE — H&P (Signed)
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 Alcohol dependence with alcohol-induced mood disorder [F10.24] 03/09/2014  . Polysubstance abuse [F19.10] 03/09/2014  . Alcohol use disorder, severe, dependence [F10.20] 03/08/2014  . Substance induced mood disorder [F19.94] 03/08/2014  . Alcohol abuse [F10.10] 06/28/2013  . Alcohol withdrawal [F10.239] 06/28/2013  . DTs (delirium tremens) [F10.231] 06/28/2013  . Marijuana abuse [F12.10] 04/28/2011  . Alcoholism [F10.20] 04/27/2011  . Toxic encephalopathy [G92] 04/27/2011  . Psychosis [F29] 04/27/2011  . Leukocytosis [D72.829] 04/27/2011  . Hyponatremia [E87.1] 04/27/2011  . Thrombocytopenia [D69.6] 04/27/2011  . Elevated AST (SGOT) [R74.0] 04/27/2011  . Elevated bilirubin [R17] 04/27/2011   History of Present Illness:: Patient states that he uses multiple drugs and alcohol everyday and that he is ready for a change.  Patient states that he drinks 18 beers daily and has drank up to half a gallon of liquor.  Patient denies suicidal/homicidal ideation, psychosis, and paranoia.  Patient  States that he has a history of DT's "I have never heard or seen things until I had the DT's".  Patient has been in for  multiple detox and rehab; but states that he is ready to make a change and get his life together and after detox he want assistance in finding a rehab facility.    Elements:  Location:  Increase alcohol consumption. Quality:  mood disorder. Severity:  withdrawal. Duration:  Chronic. Associated Signs/Symptoms: Depression Symptoms:  depressed mood, hopelessness, anxiety, (Hypo) Manic Symptoms:   Impulsivity, Irritable Mood, Anxiety Symptoms:  Excessive Worry, Psychotic Symptoms:  Denies PTSD Symptoms: NA Total Time spent with patient: 1 hour  Past Medical History:  Past Medical History  Diagnosis Date  . ETOH abuse   . Seizures   . Hip dislocation   . Depression   . Anxiety    History reviewed. No pertinent past surgical history. Family History: History reviewed. No pertinent family history. Social History:  History  Alcohol Use  . Yes     History  Drug Use  . Yes  . Special: Marijuana, Cocaine    Comment: heroine, narcotics    History   Social History  . Marital Status: Married    Spouse Name: N/A  . Number of Children: N/A  . Years of Education: N/A   Social History Main Topics  . Smoking status: Current Every Day Smoker -- 2.00 packs/day    Types: Cigarettes  . Smokeless tobacco: Never Used  . Alcohol Use: Yes  . Drug Use: Yes    Special: Marijuana, Cocaine     Comment: heroine, narcotics  . Sexual Activity: Not on file   Other Topics Concern  . None   Social History Narrative   Additional Social History:   Musculoskeletal: Strength & Muscle Tone: within normal limits Gait & Station: normal Patient leans: N/A  Psychiatric Specialty Exam: Physical Exam  Constitutional: He is oriented to person, place, and time.  Neck: Normal range of motion.  Respiratory: Effort normal.  Musculoskeletal: Normal range of motion.  Neurological: He is alert and oriented to person, place, and time.  Skin: Skin is warm and dry.    Review of Systems  Neurological: Seizures: Related to alcohol withdrawal.  Psychiatric/Behavioral: Positive for depression and substance abuse. Negative  for suicidal ideas, hallucinations and memory loss. The patient is nervous/anxious and has insomnia.   All other systems reviewed and are negative.   Blood pressure 118/83, pulse 93, temperature 97.6 F (36.4 C), temperature source Oral, resp. rate 18, height 5\' 11"  (1.803 m),  weight 56.246 kg (124 lb).Body mass index is 17.3 kg/(m^2).  General Appearance: Disheveled  Eye Contact::  Good  Speech:  Blocked and Normal Rate  Volume:  Normal  Mood:  Depressed  Affect:  Congruent  Thought Process:  Circumstantial and Goal Directed  Orientation:  Full (Time, Place, and Person)  Thought Content:  Rumination  Suicidal Thoughts:  No  Homicidal Thoughts:  No  Memory:  Immediate;   Good Recent;   Good Remote;   Good  Judgement:  Fair  Insight:  Fair  Psychomotor Activity:  Tremor  Concentration:  Fair  Recall:  Good  Fund of Knowledge:Good  Language: Good  Akathisia:  No  Handed:  Right  AIMS (if indicated):     Assets:  Communication Skills Desire for Improvement  ADL's:  Intact  Cognition: WNL  Sleep:      Risk to Self: Is patient at risk for suicide?: No Risk to Others:   Prior Inpatient Therapy:   Prior Outpatient Therapy:    Alcohol Screening: 1. How often do you have a drink containing alcohol?: 4 or more times a week 2. How many drinks containing alcohol do you have on a typical day when you are drinking?: 10 or more 3. How often do you have six or more drinks on one occasion?: Daily or almost daily Preliminary Score: 8 4. How often during the last year have you found that you were not able to stop drinking once you had started?: Daily or almost daily 5. How often during the last year have you failed to do what was normally expected from you becasue of drinking?: Daily or almost daily 6. How often during the last year have you needed a first drink in the morning to get yourself going after a heavy drinking session?: Daily or almost daily 7. How often during the last year have you had a feeling of guilt of remorse after drinking?: Daily or almost daily 8. How often during the last year have you been unable to remember what happened the night before because you had been drinking?: Daily or almost daily 9. Have you or someone else been injured as a  result of your drinking?: No 10. Has a relative or friend or a doctor or another health worker been concerned about your drinking or suggested you cut down?: Yes, during the last year Alcohol Use Disorder Identification Test Final Score (AUDIT): 36 Brief Intervention: Yes  Allergies:  No Known Allergies Lab Results:  Results for orders placed or performed during the hospital encounter of 03/08/14 (from the past 48 hour(s))  TSH     Status: Abnormal   Collection Time: 03/09/14  6:50 AM  Result Value Ref Range   TSH 6.299 (H) 0.350 - 4.500 uIU/mL    Comment[facility]   Current Medications: Current Facility-Administered Medications  Medication Dose Route Frequency Provider Last Rate Last Dose  . acetaminophen (TYLENOL) tablet 650 mg  650 mg Oral Q6H PRN Kerry HoughSpencer E Simon, PA-C      . alum & mag hydroxide-simeth (MAALOX/MYLANTA) 200-200-20 MG/5ML suspension 30 mL  30 mL Oral Q4H PRN Kerry HoughSpencer E Simon, PA-C      . chlordiazePOXIDE (LIBRIUM) capsule 25  mg  25 mg Oral Q6H PRN Kerry Hough, PA-C      . chlordiazePOXIDE (LIBRIUM) capsule 25 mg  25 mg Oral QID Kerry Hough, PA-C   25 mg at 03/09/14 0800   Followed by  . [START ON 03/10/2014] chlordiazePOXIDE (LIBRIUM) capsule 25 mg  25 mg Oral TID Kerry Hough, PA-C       Followed by  . [START ON 03/11/2014] chlordiazePOXIDE (LIBRIUM) capsule 25 mg  25 mg Oral BH-qamhs Spencer E Simon, PA-C       Followed by  . [START ON 03/12/2014] chlordiazePOXIDE (LIBRIUM) capsule 25 mg  25 mg Oral Daily Kerry Hough, PA-C      . cyclobenzaprine (FLEXERIL) tablet 10 mg  10 mg Oral BID PRN Kerry Hough, PA-C      . hydrOXYzine (ATARAX/VISTARIL) tablet 25 mg  25 mg Oral Q6H PRN Kerry Hough, PA-C      . loperamide (IMODIUM) capsule 2-4 mg  2-4 mg Oral PRN Kerry Hough, PA-C      . magnesium hydroxide (MILK OF MAGNESIA) suspension 30 mL  30 mL Oral Daily PRN Kerry Hough, PA-C      . multivitamin with minerals tablet 1  tablet  1 tablet Oral Daily Kerry Hough, PA-C   1 tablet at 03/09/14 0800  . naproxen (NAPROSYN) tablet 500 mg  500 mg Oral BID PRN Kerry Hough, PA-C   500 mg at 03/08/14 2357  . ondansetron (ZOFRAN-ODT) disintegrating tablet 4 mg  4 mg Oral Q6H PRN Kerry Hough, PA-C      . thiamine (B-1) injection 100 mg  100 mg Intramuscular Once Kerry Hough, PA-C   100 mg at 03/09/14 0000  . thiamine (VITAMIN B-1) tablet 100 mg  100 mg Oral Daily Kerry Hough, PA-C   100 mg at 03/09/14 0800  . traZODone (DESYREL) tablet 50 mg  50 mg Oral QHS,MR X 1 Spencer E Simon, PA-C   50 mg at 03/08/14 2357   PTA Medications: Prescriptions prior to admission  Medication Sig Dispense Refill Last Dose  . cyclobenzaprine (FLEXERIL) 10 MG tablet Take 1 tablet (10 mg total) by mouth 2 (two) times daily as needed for muscle spasms. 20 tablet 0 Past Month at Unknown time  . Magnesium Salicylate Tetrahyd (DOANS EXTRA STRENGTH) 580 MG TABS Take 2 tablets by mouth once.   Unknown at Unknown time  . naproxen (NAPROSYN) 500 MG tablet Take 1 tablet (500 mg total) by mouth 2 (two) times daily. (Patient not taking: Reported on 03/07/2014) 30 tablet 0 Unknown at Unknown time    Previous Psychotropic Medications: No   Substance Abuse History in the last 12 months:  Yes.      Consequences of Substance Abuse: Family Consequences:  Family discord DT's: Withdrawal Symptoms:   Cramps Diaphoresis Nausea Tremors  Results for orders placed or performed during the hospital encounter of 03/08/14 (from the past 72 hour(s))  TSH     Status: Abnormal   Collection Time: 03/09/14  6:50 AM  Result Value Ref Range   TSH 6.299 (H) 0.350 - 4.500 uIU/mL    Comment: Performed at [facility]    Observation Level/Precautions:  15 minute checks  Laboratory:  CBC Chemistry Profile UDS UA  Psychotherapy:   Individual and group sessions  Medications:  Home medications as appropriate and Librium detox protocol   Consultations:  Psychiatry  Discharge Concerns:  Safety, stabilization, and risk of access  to medication and medication stabilization   Estimated LOS: 5-7 days  Other:     Psychological Evaluations: Yes   Treatment Plan Summary: Daily contact with patient to assess and evaluate symptoms and progress in treatment and Medication management   1. Admit for crisis management and stabilization.  2. Medication management to reduce current symptoms to base line and  improve the patient's overall level of functioning:  3. Treat health problems as indicated.  4. Develop treatment plan to decrease risk of relapse upon discharge and the  need for    readmission.  5. Psycho-social education regarding relapse prevention and self- care.  6. Health care follow up as needed for medical problems.  7. Restart home medications where appropriate.   Medical Decision Making:  Established Problem, Stable/Improving (1), Review of Psycho-Social Stressors (1), Review of Last Therapy Session (1), Independent Review of image, tracing or specimen (2) and Review of Medication Regimen & Side Effects (2)  I certify that inpatient services furnished can reasonably be expected to improve the patient's condition.    Assunta Found, FNP-BC 2/25/201611:30 AM I personally assessed the patient, reviewed the physical exam and labs and formulated the treatment plan Madie Reno A. Dub Mikes, M.D.

## 2014-03-09 NOTE — Progress Notes (Signed)
Didn't want to attend group

## 2014-03-09 NOTE — Progress Notes (Signed)
Initial Interdisciplinary Treatment Plan   PATIENT STRESSORS: Financial difficulties Substance abuse   PATIENT STRENGTHS: Ability for insight Average or above average intelligence Motivation for treatment/growth Physical Health   PROBLEM LIST: Problem List/Patient Goals Date to be addressed Date deferred Reason deferred Estimated date of resolution  "I want to get off ETOH, heroin, pain pills and all of that". 03/08/2014     Polysubstance abuse 03/08/2014     anxiety 03/08/2014                                          DISCHARGE CRITERIA:  Ability to meet basic life and health needs Adequate post-discharge living arrangements Improved stabilization in mood, thinking, and/or behavior Motivation to continue treatment in a less acute level of care Withdrawal symptoms are absent or subacute and managed without 24-hour nursing intervention  PRELIMINARY DISCHARGE PLAN: Attend aftercare/continuing care group Attend 12-step recovery group Placement in alternative living arrangements  PATIENT/FAMIILY INVOLVEMENT: This treatment plan has been presented to and reviewed with the patient, Jake [name], have been given the opportunity to ask questions and make suggestions.  JEHU-APPIAH, Akeen Ledyard K 03/09/2014, 12:22 AM

## 2014-03-09 NOTE — [facility] Group Notes (Signed)
[facility] Group Notes:  (Nursing/MHT/Case Management/Adjunct)  Date:  03/09/2014  Time:  0900  Type of Therapy:  Nurse Education  Participation Level:  Did Not Attend  Participation Quality:    Affect:    Cognitive:    Insight:    Engagement in Group:    Modes of Intervention:    Summary of Progress/Problems: Goals/leisure and lifestyle changes  Jake [name], Jake [name] 03/09/2014, 10:15 AM
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
## 2014-03-09 NOTE — [facility] Group Notes (Signed)
[facility] LCSW Group Therapy  03/09/2014 3:35 PM   Type of Therapy:  Group Therapy  Participation Level:  Did Not Attend  Summary of Progress/Problems:  Finding Balance in Life. Today's group focused on defining balance in one's own words, identifying things that can knock one off balance, and exploring healthy ways to maintain balance in life. Group members were asked to provide an example of a time when they felt off balance, describe how they handled that situation,and process healthier ways to regain balance in the future. Group members were asked to share the most important tool for maintaining balance that they [facility] and how they plan to apply this method after discharge.   Invited, did not attend.   Jake GeneraAnne Libby Goehring, LCSW

## 2014-03-09 NOTE — Progress Notes (Signed)
Recreation Therapy Notes  Animal-Assisted Activity/Therapy (AAA/T) Program Checklist/Progress Notes Patient Eligibility Criteria Checklist & Daily Group note for Rec Tx Intervention  Date:  02.25.2016 Time: 2:45pm [facility]    AAA/T Program Assumption of Risk Form signed by Patient/ or Parent Legal Guardian yes  Patient is free of allergies or sever asthma yes  Patient reports no fear of animals yes  Patient reports no history of cruelty to animals yes  Patient understands his/her participation is voluntary yes  Behavioral Response: Did not attend.   Marykay Lexenise L Margaretha Mahan, LRT/CTRS  Jearl KlinefelterBlanchfield, Larance Ratledge L 03/09/2014 4:26 PM

## 2014-03-09 NOTE — Progress Notes (Addendum)
Admission note: D:Patient is a voluntary admission in no acute distress for ETOH, heroin,cocaine, and THC. Pt reports he drinks half gallon and 12 pack daily, 2g of cocaine,  1g heroin, 4g THC all day. Pt reports role is to get off ETOH, heroin, and pain pills. Pt reports history of seizures with ETOH withdrawals and hx of blackouts. Pt reports last seizure was in July 2015. Pt denies SI/HI/AVH.  A: Pt admitted to unit per protocol, skin assessment and belonging search done. No skin issues noted. Consent signed by pt. Pt educated on therapeutic milieu rules. Pt was introduced to milieu by nursing staff. Fall risk safety plan explained to the patient. Meal offered, pt accepted.15 minutes checks started for safety.  R: Pt was receptive to education. Writer offered support.

## 2014-03-09 NOTE — Clinical Social Work Note (Signed)
CSW attempted to meet with pt this morning to complete PSA and discuss aftercare plan. Pt asleep in bed with sheet over head. Unable to complete PSA/aftercare planning at this time.  The Sherwin-WilliamsHeather Smart, LCSWA 03/09/2014 11:22 AM

## 2014-03-09 NOTE — Progress Notes (Signed)
Patient ID: Jake [name], male   DOB: 01/19/1970, 44 y.o.   MRN: [id number]  D: Patient pleasant on approach today. Reports some nausea and shakes but reports they are not too bad at present. CIWA 5 COW 6. Contracts for safety on the unit.  A: Staff will monitor on q 15 minute checks, follow treatment plan, and give meds as ordered. R: Cooperative on the unit.

## 2014-03-10 MED ORDER — LORAZEPAM 1 MG PO TABS: 1.0000 mg | ORAL_TABLET | Freq: Four times a day (QID) | ORAL | Status: AC | PRN

## 2014-03-10 MED ORDER — HYDROXYZINE HCL 25 MG PO TABS: 25.0000 mg | ORAL_TABLET | Freq: Four times a day (QID) | ORAL | Status: DC | PRN

## 2014-03-10 MED ORDER — ONDANSETRON 4 MG PO TBDP: 4.0000 mg | ORAL_TABLET | Freq: Four times a day (QID) | ORAL | Status: DC | PRN

## 2014-03-10 MED ORDER — LORAZEPAM 1 MG PO TABS
1.0000 mg | ORAL_TABLET | Freq: Two times a day (BID) | ORAL | Status: AC
  Administered 2014-03-12 – 2014-03-13 (×2): 1 mg via ORAL
  Filled 2014-03-10 (×2): qty 1

## 2014-03-10 MED ORDER — LORAZEPAM 1 MG PO TABS
1.0000 mg | ORAL_TABLET | Freq: Three times a day (TID) | ORAL | Status: AC
  Administered 2014-03-11 – 2014-03-12 (×3): 1 mg via ORAL
  Filled 2014-03-10 (×2): qty 1

## 2014-03-10 MED ORDER — LORAZEPAM 1 MG PO TABS: 1.0000 mg | ORAL_TABLET | Freq: Every day | ORAL | Status: DC

## 2014-03-10 MED ORDER — LORAZEPAM 1 MG PO TABS: 1.0000 mg | ORAL_TABLET | Freq: Two times a day (BID) | ORAL | Status: DC

## 2014-03-10 MED ORDER — MELOXICAM 7.5 MG PO TABS
7.5000 mg | ORAL_TABLET | Freq: Two times a day (BID) | ORAL | Status: DC
  Administered 2014-03-10 – 2014-03-14 (×8): 7.5 mg via ORAL
  Filled 2014-03-10 (×10): qty 1

## 2014-03-10 MED ORDER — LORAZEPAM 1 MG PO TABS
0.0000 mg | ORAL_TABLET | Freq: Four times a day (QID) | ORAL | Status: AC
  Administered 2014-03-10: 2 mg via ORAL
  Administered 2014-03-11: 1 mg via ORAL
  Filled 2014-03-10 (×2): qty 1
  Filled 2014-03-10: qty 2

## 2014-03-10 MED ORDER — METHOCARBAMOL 750 MG PO TABS: 750.0000 mg | ORAL_TABLET | Freq: Four times a day (QID) | ORAL | Status: DC | PRN

## 2014-03-10 MED ORDER — LORAZEPAM 1 MG PO TABS: 1.0000 mg | ORAL_TABLET | Freq: Three times a day (TID) | ORAL | Status: DC

## 2014-03-10 MED ORDER — ALUM & MAG HYDROXIDE-SIMETH 200-200-20 MG/5ML PO SUSP: 30.0000 mL | ORAL | Status: DC | PRN

## 2014-03-10 MED ORDER — LORAZEPAM 1 MG PO TABS
1.0000 mg | ORAL_TABLET | Freq: Four times a day (QID) | ORAL | Status: AC
  Administered 2014-03-11 (×2): 1 mg via ORAL
  Filled 2014-03-10 (×2): qty 1

## 2014-03-10 MED ORDER — LORAZEPAM 1 MG PO TABS
1.0000 mg | ORAL_TABLET | Freq: Every day | ORAL | Status: AC
  Administered 2014-03-14: 1 mg via ORAL
  Filled 2014-03-10: qty 1

## 2014-03-10 MED ORDER — LOPERAMIDE HCL 2 MG PO CAPS: 2.0000 mg | ORAL_CAPSULE | ORAL | Status: DC | PRN

## 2014-03-10 MED ORDER — THIAMINE HCL 100 MG/ML IJ SOLN: 100.0000 mg | Freq: Once | INTRAMUSCULAR | Status: DC

## 2014-03-10 MED ORDER — VITAMIN B-1 100 MG PO TABS
100.0000 mg | ORAL_TABLET | Freq: Every day | ORAL | Status: DC
  Administered 2014-03-12 – 2014-03-14 (×3): 100 mg via ORAL
  Filled 2014-03-10 (×4): qty 1

## 2014-03-10 MED ORDER — NICOTINE 21 MG/24HR TD PT24
21.0000 mg | MEDICATED_PATCH | Freq: Every day | TRANSDERMAL | Status: DC
  Administered 2014-03-11 – 2014-03-13 (×3): 21 mg via TRANSDERMAL
  Filled 2014-03-10 (×5): qty 1

## 2014-03-10 MED ORDER — TRAZODONE HCL 100 MG PO TABS
100.0000 mg | ORAL_TABLET | Freq: Every evening | ORAL | Status: DC | PRN
Start: 2014-03-10 — End: 2014-03-14
  Administered 2014-03-10 – 2014-03-13 (×6): 100 mg via ORAL
  Filled 2014-03-10 (×11): qty 1

## 2014-03-10 MED ORDER — MELOXICAM 7.5 MG PO TABS
15.0000 mg | ORAL_TABLET | Freq: Two times a day (BID) | ORAL | Status: DC
  Filled 2014-03-10 (×2): qty 2

## 2014-03-10 MED ORDER — LORAZEPAM 1 MG PO TABS
0.0000 mg | ORAL_TABLET | Freq: Two times a day (BID) | ORAL | Status: AC
Start: 2014-03-11 — End: 2014-03-13
  Administered 2014-03-11 – 2014-03-12 (×2): 1 mg via ORAL
  Filled 2014-03-10 (×2): qty 1

## 2014-03-10 MED ORDER — ONDANSETRON HCL 4 MG PO TABS: 4.0000 mg | ORAL_TABLET | Freq: Three times a day (TID) | ORAL | Status: DC | PRN

## 2014-03-10 MED ORDER — IBUPROFEN 600 MG PO TABS
600.0000 mg | ORAL_TABLET | Freq: Three times a day (TID) | ORAL | Status: DC | PRN
  Administered 2014-03-10 – 2014-03-11 (×2): 600 mg via ORAL
  Filled 2014-03-10 (×2): qty 1

## 2014-03-10 MED ORDER — LORAZEPAM 1 MG PO TABS
1.0000 mg | ORAL_TABLET | Freq: Four times a day (QID) | ORAL | Status: DC
  Administered 2014-03-10 (×2): 1 mg via ORAL
  Filled 2014-03-10 (×2): qty 1

## 2014-03-10 MED ORDER — ENSURE COMPLETE PO LIQD
237.0000 mL | Freq: Two times a day (BID) | ORAL | Status: DC
  Administered 2014-03-10 – 2014-03-13 (×4): 237 mL via ORAL

## 2014-03-10 MED ORDER — THIAMINE HCL 100 MG/ML IJ SOLN: 100.0000 mg | Freq: Every day | INTRAMUSCULAR | Status: DC

## 2014-03-10 NOTE — [facility] Group Notes (Signed)
[facility] Group Notes:  (Nursing/MHT/Case Management/Adjunct)  Date:  03/10/2014  Time:  1000  Type of Therapy:  Psychoeducational Skills  Participation Level:  Did Not Attend  Participation Quality:    Affect:    Cognitive:    Insight:    Engagement in Group:    Modes of Intervention:    Summary of Progress/Problems:  Layla BarterWhite, Ursula Dermody L 03/10/2014, 12:23 PM
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
## 2014-03-10 NOTE — Progress Notes (Signed)
NUTRITION ASSESSMENT  Pt identified as at risk on the Malnutrition Screen Tool  INTERVENTION: 1. Supplements: Ensure Complete po BID, each supplement provides 350 kcal and 13 grams of protein  NUTRITION DIAGNOSIS: Increased nutrient needs related to ETOH abuse as evidenced by estimated nutritional needs.   Goal: Pt to meet >/= 90% of their estimated nutrition needs.  Monitor:  PO intake  Assessment:  Pt admitted with ETOH and substance abuse.  Pt underweight. Pt is stable per weight history documentation. Pt at nutritional risk d/t history of alcohol and substance abuse. RD suspects poor quality diet PTA. RD to order nutritional supplement.   Height: Ht Readings from Last 1 Encounters:  03/08/14 5\' 11"  (1.803 m)    Weight: Wt Readings from Last 1 Encounters:  03/08/14 124 lb (56.246 kg)    Weight Hx: Wt Readings from Last 10 Encounters:  03/08/14 124 lb (56.246 kg)  04/28/11 124 lb 5.4 oz (56.4 kg)    BMI:  Body mass index is 17.3 kg/(m^2). Pt meets criteria for underweight based on current BMI.  Estimated Nutritional Needs: Kcal: 30-35 kcal/kg Protein: > 1 gram protein/kg Fluid: 1 ml/kcal  Diet Order: Diet regular Pt is also offered choice of unit snacks mid-morning and mid-afternoon.  Pt is eating as desired.   Lab results and medications reviewed.   Tilda FrancoLindsey Randa Riss, MS, RD, LDN Pager: [phone] After Hours Pager: [phone]
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
## 2014-03-10 NOTE — Progress Notes (Signed)
D: client in room most of this shift, reports "cold sweats" "tremors" client notes pain relief currently at "5" of 10 "feel a lot better" A: Writer introduced self to client, provided emotional support, medication administered as ordered. Staff will monitor q8815min for safety. R: client is safe on the unit, attended karaoke briefly came back to the unit, reporting "it was just to loud"

## 2014-03-10 NOTE — [facility] Group Notes (Signed)
[facility]   03/10/2014 12:21 PM  Participation Quality:  Appropriate   Mood/Affect:  Appropriate  Depression Rating:  8  Anxiety Rating:  9  Thoughts of Suicide:  No Will you contract for safety?   NA  Current AVH:  No  Plan for Discharge/Comments:  Pt reports severe withdrawals today. Seeking referrals for [facility] and [facility] and currently has no o/p follow-up. CSW assessing. Pt [facility].   Transportation Means: bus  Supports: none reported.   Smart, American FinancialHeather LCSWA

## 2014-03-10 NOTE — Progress Notes (Addendum)
Pt appears very dishelved and stated,"I am going through bad withdrawals sweating and see my hands shaking." pt is pleasant and cooperative and contracts for safety. He was given his standing dose of librium. Pt went back to bed stating,"I do not feel good this am." He denies SI and HI. Pt stated his depression is a 9/10 and his anxiety is a 10/10./he stated he does feel depressed today and has withdrawal symtpoms which include: runny nose, irritable , tremors and chills. Pts CIWA this am is an 8.

## 2014-03-11 DIAGNOSIS — F1123 Opioid dependence with withdrawal: Secondary | ICD-10-CM

## 2014-03-11 MED ORDER — THIAMINE HCL 100 MG/ML IJ SOLN
100.0000 mg | Freq: Once | INTRAMUSCULAR | Status: AC
  Administered 2014-03-11: 100 mg via INTRAMUSCULAR
  Filled 2014-03-11: qty 2

## 2014-03-11 NOTE — Progress Notes (Signed)
Patient lying in bed all shift. Depressed mood. Cooperative and appropriate. C/O withdrawal symptoms - tremor which is visible when stretches his arm. Hot and cold flashes, anxiety and neck pain. Patient denies SI, AH/VH. Patient accepted due medications as ordered. Patient encouraged to continue with the treatment plan. Every 15 minutes check for safety maintained. Will continue to monitor for safety and stability.

## 2014-03-11 NOTE — Plan of Care (Signed)
Problem: Consults Goal: Anxiety Disorder Patient Education See Patient Education Module for eduction specifics.  Outcome: Completed/Met Date Met:  03/11/14 Nurse discussed anxiety with patient.

## 2014-03-11 NOTE — Progress Notes (Signed)
Patient continues to complain of withdrawals syndrome -shakiness, hot/cold flashes, stomach upset and anxiety. C/o shoulder pain of 8/10. Patient accepted scheduled Ativan 1 mg for withdrawals and Motrin 600 mg PRN for pain. Denies SI, AH/VH. Patient encouraged to continue with the treatment plan and verbalize concerns to staff. Safety maintained at all times. Will continue to monitor patient for stability.

## 2014-03-11 NOTE — Progress Notes (Signed)
[facility] MD Progress Note  03/11/2014 5:22 PM DMARI SCHUBRING  MRN:  [id number] Subjective:  Jake [name] has been in bed most of day. Continues to report withdrawal symptoms of stomach cramps, sweats.  He has been able to get up for meals and is tolerating well. He rates Depression 9/10 and Anxiety 9/10 Denies AVH   He verbalizes feelings of guilt over "what I have done to myself over the years"     Principal Problem: Alcohol dependence with alcohol-induced mood disorder Diagnosis:   Patient Active Problem List   Diagnosis Date Noted  . Alcohol dependence with alcohol-induced mood disorder [F10.24] 03/09/2014  . Polysubstance abuse [F19.10] 03/09/2014  . Opioid dependence [F11.20] 03/09/2014  . Alcohol use disorder, severe, dependence [F10.20] 03/08/2014  . Substance induced mood disorder [F19.94] 03/08/2014  . Alcohol abuse [F10.10] 06/28/2013  . Alcohol withdrawal [F10.239] 06/28/2013  . DTs (delirium tremens) [F10.231] 06/28/2013  . Marijuana abuse [F12.10] 04/28/2011  . Alcoholism [F10.20] 04/27/2011  . Toxic encephalopathy [G92] 04/27/2011  . Psychosis [F29] 04/27/2011  . Leukocytosis [D72.829] 04/27/2011  . Hyponatremia [E87.1] 04/27/2011  . Thrombocytopenia [D69.6] 04/27/2011  . Elevated AST (SGOT) [R74.0] 04/27/2011  . Elevated bilirubin [R17] 04/27/2011   Total Time spent with patient: 30 minutes   Past Medical History:  Past Medical History  Diagnosis Date  . ETOH abuse   . Seizures   . Hip dislocation   . Depression   . Anxiety    History reviewed. No pertinent past surgical history. Family History: History reviewed. No pertinent family history. Social History:  History  Alcohol Use  . Yes     History  Drug Use  . Yes  . Special: Marijuana, Cocaine    Comment: heroine, narcotics    History   Social History  . Marital Status: Married    Spouse Name: N/A  . Number of Children: N/A  . Years of Education: N/A   Social History Main Topics  . Smoking  status: Current Every Day Smoker -- 2.00 packs/day    Types: Cigarettes  . Smokeless tobacco: Never Used  . Alcohol Use: Yes  . Drug Use: Yes    Special: Marijuana, Cocaine     Comment: heroine, narcotics  . Sexual Activity: Not on file   Other Topics Concern  . None   Social History Narrative   Additional History:    Sleep: Fair  Appetite:  Good   Assessment:   Musculoskeletal: Strength & Muscle Tone: within normal limits Gait & Station: normal Patient leans: N/A   Psychiatric Specialty Exam: Physical Exam  ROS  Blood pressure 102/61, pulse 81, temperature 97.8 F (36.6 C), temperature source Oral, resp. rate 16, height  (1.803 m), weight 56.246 kg (124 lb).Body mass index is 17.3 kg/(m^2).  General Appearance: Disheveled  Eye Solicitor::  Fair  Speech:  Clear and Coherent  Volume:  normal  Mood:  Anxious and depressed  Affect:  Labile and anxious worried  Thought Process:  Coherent and Goal Directed  Orientation:  Full (Time, Place, and Person)  Thought Content:  symptoms worries concerns  Suicidal Thoughts:  No  Homicidal Thoughts:  No  Memory:  Immediate;   Fair Recent;   Fair Remote;   Fair  Judgement:  Fair  Insight:  Present and Shallow  Psychomotor Activity:  normal  Concentration:  Fair  Recall:  [employer] of Knowledge:Fair  Language: Fair  Akathisia:  No  Handed:  Right  AIMS (if indicated):  Assets:  Desire for Improvement  ADL's:  Intact  Cognition: WNL  Sleep:        Current Medications: Current Facility-Administered Medications  Medication Dose Route Frequency Provider Last Rate Last Dose  . acetaminophen (TYLENOL) tablet 650 mg  650 mg Oral Q6H PRN Kerry Hough, PA-C   650 mg at 03/09/14 2147  . alum & mag hydroxide-simeth (MAALOX/MYLANTA) 200-200-20 MG/5ML suspension 30 mL  30 mL Oral Q4H PRN Kerry Hough, PA-C      . alum & mag hydroxide-simeth (MAALOX/MYLANTA) 200-200-20 MG/5ML suspension 30 mL  30 mL Oral PRN  Earney Navy, NP      . cloNIDine (CATAPRES) tablet 0.1 mg  0.1 mg Oral QID Rachael Fee, MD   0.1 mg at 03/11/14 1202   Followed by  . [START ON 03/12/2014] cloNIDine (CATAPRES) tablet 0.1 mg  0.1 mg Oral BH-qamhs Rachael Fee, MD       Followed by  . [START ON 03/14/2014] cloNIDine (CATAPRES) tablet 0.1 mg  0.1 mg Oral QAC breakfast Rachael Fee, MD      . dicyclomine (BENTYL) tablet 20 mg  20 mg Oral Q6H PRN Rachael Fee, MD   20 mg at 03/10/14 1132  . feeding supplement (ENSURE COMPLETE) (ENSURE COMPLETE) liquid 237 mL  237 mL Oral BID BM Tilda Franco, RD   237 mL at 03/10/14 1644  . hydrOXYzine (ATARAX/VISTARIL) tablet 25 mg  25 mg Oral Q6H PRN Kerry Hough, PA-C      . ibuprofen (ADVIL,MOTRIN) tablet 600 mg  600 mg Oral Q8H PRN Earney Navy, NP   600 mg at 03/10/14 2122  . loperamide (IMODIUM) capsule 2-4 mg  2-4 mg Oral PRN Kerry Hough, PA-C      . LORazepam (ATIVAN) tablet 0-4 mg  0-4 mg Oral 4 times per day Earney Navy, NP   1 mg at 03/11/14 0640   Followed by  . LORazepam (ATIVAN) tablet 0-4 mg  0-4 mg Oral Q12H Earney Navy, NP      . LORazepam (ATIVAN) tablet 1 mg  1 mg Oral Q6H PRN Rachael Fee, MD      . LORazepam (ATIVAN) tablet 1 mg  1 mg Oral TID Rachael Fee, MD   1 mg at 03/11/14 1641   Followed by  . [START ON 03/12/2014] LORazepam (ATIVAN) tablet 1 mg  1 mg Oral BID Rachael Fee, MD       Followed by  . [START ON 03/14/2014] LORazepam (ATIVAN) tablet 1 mg  1 mg Oral Daily Rachael Fee, MD      . magnesium hydroxide (MILK OF MAGNESIA) suspension 30 mL  30 mL Oral Daily PRN Kerry Hough, PA-C      . meloxicam (MOBIC) tablet 7.5 mg  7.5 mg Oral BID Rachael Fee, MD   7.5 mg at 03/11/14 1641  . methocarbamol (ROBAXIN) tablet 750 mg  750 mg Oral Q6H PRN Rachael Fee, MD      . multivitamin with minerals tablet 1 tablet  1 tablet Oral Daily Kerry Hough, PA-C   1 tablet at 03/11/14 [phone]  . nicotine (NICODERM CQ - dosed in mg/24 hours)  patch 21 mg  21 mg Transdermal Daily Rachael Fee, MD   Stopped at 03/11/14 [phone]  . nicotine (NICODERM CQ - dosed in mg/24 hours) patch 21 mg  21 mg Transdermal Daily Earney Navy, NP   21  mg at 03/11/14 0835  . ondansetron (ZOFRAN) tablet 4 mg  4 mg Oral Q8H PRN Earney NavyJosephine C Onuoha, NP      . ondansetron (ZOFRAN-ODT) disintegrating tablet 4 mg  4 mg Oral Q6H PRN Kerry HoughSpencer E Simon, PA-C   4 mg at 03/10/14 1133  . thiamine (VITAMIN B-1) tablet 100 mg  100 mg Oral Daily Earney NavyJosephine C Onuoha, NP   0 mg at 03/11/14 [phone]   Or  . thiamine (B-1) injection 100 mg  100 mg Intravenous Daily Earney NavyJosephine C Onuoha, NP      . thiamine (VITAMIN B-1) tablet 100 mg  100 mg Oral Daily Kerry HoughSpencer E Simon, PA-C   100 mg at 03/11/14 [phone]  . traZODone (DESYREL) tablet 100 mg  100 mg Oral QHS,MR X 1 Rachael FeeIrving A Lugo, MD   100 mg at 03/10/14 2349    Lab Results:  No results found for this or any previous visit (from the past 48 hour(s)).  Physical Findings: AIMS: Facial and Oral Movements Muscles of Facial Expression: None, normal Lips and Perioral Area: None, normal Jaw: None, normal Tongue: None, normal,Extremity Movements Upper (arms, wrists, hands, fingers): None, normal Lower (legs, knees, ankles, toes): None, normal, Trunk Movements Neck, shoulders, hips: None, normal, Overall Severity Severity of abnormal movements (highest score from questions above): None, normal Incapacitation due to abnormal movements: None, normal Patient's awareness of abnormal movements (rate only patient's report): No Awareness, Dental Status Current problems with teeth and/or dentures?: Yes (cavities) Does patient usually wear dentures?: No  CIWA:  CIWA-Ar Total: 2 COWS:  COWS Total Score: 5  Treatment Plan Summary: Daily contact with patient to assess and evaluate symptoms and progress in treatment and Medication management    Alcohol dependence/withdrawal: continue Ativan protocol. He feels better today Opioid  Dependence/withdrawal; Continue clonidine detox protocol Monitor Mood instability and address as indicated Work a relapse prevention plan Explore residential [facility] programs[facility]  Medical Decision Making:  Review of Psycho-Social Stressors (1), Review or order clinical lab tests (1), Review of Medication Regimen & Side Effects (2) and Review of New Medication or Change in Dosage (2)     LARACH, MARY  PMHNP 03/11/2014, 5:22 PM  I agreed with the findings, treatment and disposition plan of this patient. Kathryne SharperSyed Rigley Niess, MD

## 2014-03-11 NOTE — Progress Notes (Signed)
.  Psychoeducational Group Note    Date: 03/11/2014 Time: 0930    Goal Setting Purpose of Group: To be able to set a goal that is measurable and that can be accomplished in one day Participation Level:  Active  Participation Quality:  Appropriate  Affect:  Appropriate  Cognitive:  Oriented  Insight:  Improving  Engagement in Group:  Engaged  Additional Comments:  Pt participating and adding to the group.   Sharayah Renfrow A  

## 2014-03-11 NOTE — Progress Notes (Signed)
.  Psychoeducational Group Note    Date: 03/11/2014 Time: 0930    Goal Setting Purpose of Group: To be able to set a goal that is measurable and that can be accomplished in one day Participation Level:  Active  Participation Quality:  Appropriate  Affect:  Appropriate  Cognitive:  Oriented  Insight:  Improving  Engagement in Group:  Engaged  Additional Comments:  Pt participating and adding to the group.   Clarrissa Shimkus A  

## 2014-03-11 NOTE — [facility] Group Notes (Signed)
[facility] Group Notes: (Clinical Social Work)   03/11/2014      Type of Therapy:  Group Therapy   Participation Level:  Did Not Attend despite MHT prompting   Orli Degrave Grossman-Orr, LCSW 03/11/2014, 2:51 PM     

## 2014-03-11 NOTE — [facility] Group Notes (Addendum)
The focus of this group is to educate the patient on the purpose and policies of crisis stabilization and provide a format to answer questions about their admission.  The group details unit policies and expectations of patients while admitted.  Patient attended  0900 nurse education orientation group this morning.  Patient listened during group, alert.  Stated he did not have a goal that he wanted to work on today.

## 2014-03-12 DIAGNOSIS — F332 Major depressive disorder, recurrent severe without psychotic features: Secondary | ICD-10-CM | POA: Diagnosis present

## 2014-03-12 DIAGNOSIS — F1020 Alcohol dependence, uncomplicated: Secondary | ICD-10-CM | POA: Diagnosis present

## 2014-03-12 DIAGNOSIS — G4700 Insomnia, unspecified: Secondary | ICD-10-CM | POA: Diagnosis present

## 2014-03-12 DIAGNOSIS — F419 Anxiety disorder, unspecified: Secondary | ICD-10-CM | POA: Diagnosis present

## 2014-03-12 DIAGNOSIS — F17210 Nicotine dependence, cigarettes, uncomplicated: Secondary | ICD-10-CM | POA: Diagnosis present

## 2014-03-12 DIAGNOSIS — F1120 Opioid dependence, uncomplicated: Secondary | ICD-10-CM | POA: Diagnosis present

## 2014-03-12 DIAGNOSIS — M25512 Pain in left shoulder: Secondary | ICD-10-CM | POA: Diagnosis present

## 2014-03-12 NOTE — Progress Notes (Signed)
D:  Patient's self inventory sheet, patient had poor sleep, sleep medication is helpful.  Fair appetite, low energy level, poor concentration.  Rated depression, hopeless and anxiety 9.  Withdrawals of tremors, chilling, cravings, cramping, agitation, irritability.  Denied SI.  Physical problems lightheaded, pain, dizziness, blurred vision.  Physical pain #8 shoulder.  Pain medication is not helpful.   A:  Medications administered per MD orders.  Emotional support and encouragement given patient. R:  Denied SI and HI, contracts for safety.  Denied A/V hallucinations.  Safety maintained with 15 minute checks. Patient stated he did not sleep well last night because of roommate's snoring.  Patient has been sleeping all morning, did not attend any groups.  Plans to go to dining room for lunch.

## 2014-03-12 NOTE — [facility] Group Notes (Signed)
[facility] Group Notes: (Clinical Social Work)   03/12/2014      Type of Therapy:  Group Therapy   Participation Level:  Did Not Attend despite MHT prompting   Ambrose MantleMareida Grossman-Orr, LCSW 03/12/2014, 2:19 PM

## 2014-03-12 NOTE — Plan of Care (Signed)
Problem: Consults Goal: [facility] Patient Education Outcome: Completed/Met Date Met:  03/12/14 Nurse discussed treatment plan with patient.

## 2014-03-12 NOTE — [facility] Group Notes (Signed)
Adult Psychoeducational Group Note  Date:  03/12/2014 Time:  1000  Group Topic/Focus:  The focus of this group is to help patients identify 2 things they are most grateful for in their lives.  What helps ground them and to center them on their work to their recovery.  Participation Level:  Did Not Attend  Participation Quality:    Affect:    Cognitive:    Insight:   Engagement in Group:    Modes of Intervention:    Additional Comments  Earline MayotteKnight, Cassandria Drew Shephard 03/12/2014, 3:51 PM

## 2014-03-12 NOTE — Progress Notes (Signed)
[facility] MD Progress Note  03/12/2014 2:54 PM Jake [name]  MRN:  [id number] Subjective:  Continues to report withdrawal symptoms of stomach cramps, sweats.  He reports poor sleep 2/2 to room-mates snoring and desire to change room due to fact that roommate has had fever.  Patient will move to 300 hall He rates Depression 10/10 and Anxiety 10/10 Denies AVH   Appetite is good He verbalizes concern regarding his discharge and ability to work or pay for rehab services, encouraged him to speak with SW in morning    Principal Problem: Alcohol dependence with alcohol-induced mood disorder Diagnosis:   Patient Active Problem List   Diagnosis Date Noted  . Alcohol dependence with alcohol-induced mood disorder [F10.24] 03/09/2014  . Polysubstance abuse [F19.10] 03/09/2014  . Opioid dependence [F11.20] 03/09/2014  . Alcohol use disorder, severe, dependence [F10.20] 03/08/2014  . Substance induced mood disorder [F19.94] 03/08/2014  . Alcohol abuse [F10.10] 06/28/2013  . Alcohol withdrawal [F10.239] 06/28/2013  . DTs (delirium tremens) [F10.231] 06/28/2013  . Marijuana abuse [F12.10] 04/28/2011  . Alcoholism [F10.20] 04/27/2011  . Toxic encephalopathy [G92] 04/27/2011  . Psychosis [F29] 04/27/2011  . Leukocytosis [D72.829] 04/27/2011  . Hyponatremia [E87.1] 04/27/2011  . Thrombocytopenia [D69.6] 04/27/2011  . Elevated AST (SGOT) [R74.0] 04/27/2011  . Elevated bilirubin [R17] 04/27/2011   Total Time spent with patient: 30 minutes   Past Medical History:  Past Medical History  Diagnosis Date  . ETOH abuse   . Seizures   . Hip dislocation   . Depression   . Anxiety    History reviewed. No pertinent past surgical history. Family History: History reviewed. No pertinent family history. Social History:  History  Alcohol Use  . Yes     History  Drug Use  . Yes  . Special: Marijuana, Cocaine    Comment: heroine, narcotics    History   Social History  . Marital Status: Married   Spouse Name: N/A  . Number of Children: N/A  . Years of Education: N/A   Social History Main Topics  . Smoking status: Current Every Day Smoker -- 2.00 packs/day    Types: Cigarettes  . Smokeless tobacco: Never Used  . Alcohol Use: Yes  . Drug Use: Yes    Special: Marijuana, Cocaine     Comment: heroine, narcotics  . Sexual Activity: Not on file   Other Topics Concern  . None   Social History Narrative   Additional History:    Sleep: Fair  Appetite:  Good   Assessment:   Musculoskeletal: Strength & Muscle Tone: within normal limits Gait & Station: normal Patient leans: N/A   Psychiatric Specialty Exam: Physical Exam  Constitutional: He is oriented to person, place, and time. He appears well-developed and well-nourished.  HENT:  Head: Normocephalic and atraumatic.  Neck: Normal range of motion. Neck supple.  Musculoskeletal: Normal range of motion.  Neurological: He is alert and oriented to person, place, and time.  Skin: Skin is warm and dry.    ROS  Blood pressure 110/65, pulse 92, temperature 97.7 F (36.5 C), temperature source Oral, resp. rate 20, height  (1.803 m), weight 56.246 kg (124 lb).Body mass index is 17.3 kg/(m^2).  General Appearance: Disheveled  Eye Solicitor::  Fair  Speech:  Clear and Coherent  Volume:  normal  Mood:  Anxious and depressed  Affect:  Labile and anxious worried  Thought Process:  Coherent and Goal Directed  Orientation:  Full (Time, Place, and Person)  Thought Content:  symptoms  worries concerns  Suicidal Thoughts:  No  Homicidal Thoughts:  No  Memory:  Immediate;   Fair Recent;   Fair Remote;   Fair  Judgement:  Fair  Insight:  Present and Shallow  Psychomotor Activity:  normal  Concentration:  Fair  Recall:  [employer] of Knowledge:Fair  Language: Fair  Akathisia:  No  Handed:  Right  AIMS (if indicated):     Assets:  Desire for Improvement  ADL's:  Intact  Cognition: WNL  Sleep:        Current  Medications: Current Facility-Administered Medications  Medication Dose Route Frequency Provider Last Rate Last Dose  . acetaminophen (TYLENOL) tablet 650 mg  650 mg Oral Q6H PRN Kerry Hough, PA-C   650 mg at 03/09/14 2147  . alum & mag hydroxide-simeth (MAALOX/MYLANTA) 200-200-20 MG/5ML suspension 30 mL  30 mL Oral Q4H PRN Kerry Hough, PA-C      . alum & mag hydroxide-simeth (MAALOX/MYLANTA) 200-200-20 MG/5ML suspension 30 mL  30 mL Oral PRN Earney Navy, NP      . cloNIDine (CATAPRES) tablet 0.1 mg  0.1 mg Oral BH-qamhs Rachael Fee, MD   0.1 mg at 03/12/14 0809   Followed by  . [START ON 03/14/2014] cloNIDine (CATAPRES) tablet 0.1 mg  0.1 mg Oral QAC breakfast Rachael Fee, MD      . dicyclomine (BENTYL) tablet 20 mg  20 mg Oral Q6H PRN Rachael Fee, MD   20 mg at 03/10/14 1132  . feeding supplement (ENSURE COMPLETE) (ENSURE COMPLETE) liquid 237 mL  237 mL Oral BID BM Tilda Franco, RD   237 mL at 03/12/14 1100  . ibuprofen (ADVIL,MOTRIN) tablet 600 mg  600 mg Oral Q8H PRN Earney Navy, NP   600 mg at 03/11/14 2004  . LORazepam (ATIVAN) tablet 0-4 mg  0-4 mg Oral Q12H Earney Navy, NP   1 mg at 03/11/14 2002  . LORazepam (ATIVAN) tablet 1 mg  1 mg Oral Q6H PRN Rachael Fee, MD      . LORazepam (ATIVAN) tablet 1 mg  1 mg Oral BID Rachael Fee, MD       Followed by  . [START ON 03/14/2014] LORazepam (ATIVAN) tablet 1 mg  1 mg Oral Daily Rachael Fee, MD      . magnesium hydroxide (MILK OF MAGNESIA) suspension 30 mL  30 mL Oral Daily PRN Kerry Hough, PA-C      . meloxicam (MOBIC) tablet 7.5 mg  7.5 mg Oral BID Rachael Fee, MD   7.5 mg at 03/12/14 0865  . methocarbamol (ROBAXIN) tablet 750 mg  750 mg Oral Q6H PRN Rachael Fee, MD      . multivitamin with minerals tablet 1 tablet  1 tablet Oral Daily Kerry Hough, PA-C   1 tablet at 03/12/14 [phone]  . nicotine (NICODERM CQ - dosed in mg/24 hours) patch 21 mg  21 mg Transdermal Daily Rachael Fee, MD   Stopped  at 03/11/14 [phone]  . nicotine (NICODERM CQ - dosed in mg/24 hours) patch 21 mg  21 mg Transdermal Daily Earney Navy, NP   21 mg at 03/12/14 5284  . ondansetron (ZOFRAN) tablet 4 mg  4 mg Oral Q8H PRN Earney Navy, NP      . thiamine (VITAMIN B-1) tablet 100 mg  100 mg Oral Daily Earney Navy, NP   100 mg at 03/12/14 0813   Or  .  thiamine (B-1) injection 100 mg  100 mg Intravenous Daily Earney NavyJosephine C Onuoha, NP      . thiamine (VITAMIN B-1) tablet 100 mg  100 mg Oral Daily Kerry HoughSpencer E Simon, PA-C   100 mg at 03/11/14 [phone]  . traZODone (DESYREL) tablet 100 mg  100 mg Oral QHS,MR X 1 Rachael FeeIrving A Lugo, MD   100 mg at 03/11/14 2313    Lab Results:  No results found for this or any previous visit (from the past 48 hour(s)).  Physical Findings: AIMS: Facial and Oral Movements Muscles of Facial Expression: None, normal Lips and Perioral Area: None, normal Jaw: None, normal Tongue: None, normal,Extremity Movements Upper (arms, wrists, hands, fingers): None, normal Lower (legs, knees, ankles, toes): None, normal, Trunk Movements Neck, shoulders, hips: None, normal, Overall Severity Severity of abnormal movements (highest score from questions above): None, normal Incapacitation due to abnormal movements: None, normal Patient's awareness of abnormal movements (rate only patient's report): No Awareness, Dental Status Current problems with teeth and/or dentures?: Yes Does patient usually wear dentures?: No  CIWA:  CIWA-Ar Total: 1 COWS:  COWS Total Score: 2  Treatment Plan Summary: Daily contact with patient to assess and evaluate symptoms and progress in treatment and Medication management  Alcohol dependence/withdrawal: continue Ativan protocol.  Opioid Dependence/withdrawal; Continue clonidine detox protocol Monitor Mood instability and address as indicated Work a relapse prevention plan  Explore [facility] programs[facility] Review of chart, vital  signs, medications and notes Daily contact with the patient to assess and evaluate synmptoms and progress in treatment  Continue crisis management and stabilization. Estimated length of stay 5-7 Medication management to reduce current symptoms to base line and improve patient's overall level of functioning Medications reviewed with the pateint and no untoward effects  Individual and group therapy encouraged Coping skills for depression, substance abuse, and anxiety  Treat health problems as indicated.    Medical Decision Making:  Review of Psycho-Social Stressors (1), Review or order clinical lab tests (1), Review of Medication Regimen & Side Effects (2) and Review of New Medication or Change in Dosage (2)     LARACH, MARY  PMHNP 03/12/2014, 2:54 PM  I agreed with the findings, treatment and disposition plan of this patient. Kathryne SharperSyed Nadya Hopwood, MD

## 2014-03-12 NOTE — Progress Notes (Signed)
Adult Psychoeducational Group Note  Date:  03/12/2014 Time:  2015  Group Topic/Focus:  AA Meeting  Participation Level:  Active  Additional Comments:  Pt attended group.   Skylor Hughson, [facility] 03/12/2014, 10:32 PM

## 2014-03-13 MED ORDER — ADULT MULTIVITAMIN W/MINERALS CH: 1.0000 | ORAL_TABLET | Freq: Every day | ORAL | Status: DC

## 2014-03-13 MED ORDER — MELOXICAM 7.5 MG PO TABS: 7.5000 mg | ORAL_TABLET | Freq: Two times a day (BID) | ORAL | Status: DC

## 2014-03-13 MED ORDER — QUETIAPINE FUMARATE 100 MG PO TABS
100.0000 mg | ORAL_TABLET | Freq: Once | ORAL | Status: AC
  Administered 2014-03-13: 100 mg via ORAL
  Filled 2014-03-13 (×2): qty 1

## 2014-03-13 MED ORDER — TRAZODONE HCL 100 MG PO TABS: 100.0000 mg | ORAL_TABLET | Freq: Every evening | ORAL | Status: DC | PRN

## 2014-03-13 MED ORDER — LORAZEPAM 1 MG PO TABS
ORAL_TABLET | ORAL | Status: AC
  Administered 2014-03-13: 1 mg
  Filled 2014-03-13: qty 1

## 2014-03-13 NOTE — Progress Notes (Signed)
[facility] MD Progress Note  03/13/2014 6:26 PM SOHAN POTVIN  MRN:  [id number] Subjective:  Jake [name] is going to [facility] in the morning. States he is very anxious right now. States that all that is going on is getting to him. He asked to see if there was anything we could give him to take care of the anxiety. He is worried he will not have anymore Ativan available to him.  Principal Problem: Alcohol dependence with alcohol-induced mood disorder Diagnosis:   Patient Active Problem List   Diagnosis Date Noted  . Alcohol dependence with alcohol-induced mood disorder [F10.24] 03/09/2014  . Polysubstance abuse [F19.10] 03/09/2014  . Opioid dependence [F11.20] 03/09/2014  . Alcohol use disorder, severe, dependence [F10.20] 03/08/2014  . Substance induced mood disorder [F19.94] 03/08/2014  . Alcohol abuse [F10.10] 06/28/2013  . Alcohol withdrawal [F10.239] 06/28/2013  . DTs (delirium tremens) [F10.231] 06/28/2013  . Marijuana abuse [F12.10] 04/28/2011  . Alcoholism [F10.20] 04/27/2011  . Toxic encephalopathy [G92] 04/27/2011  . Psychosis [F29] 04/27/2011  . Leukocytosis [D72.829] 04/27/2011  . Hyponatremia [E87.1] 04/27/2011  . Thrombocytopenia [D69.6] 04/27/2011  . Elevated AST (SGOT) [R74.0] 04/27/2011  . Elevated bilirubin [R17] 04/27/2011   Total Time spent with patient: 30 minutes   Past Medical History:  Past Medical History  Diagnosis Date  . ETOH abuse   . Seizures   . Hip dislocation   . Depression   . Anxiety    History reviewed. No pertinent past surgical history. Family History: History reviewed. No pertinent family history. Social History:  History  Alcohol Use  . Yes     History  Drug Use  . Yes  . Special: Marijuana, Cocaine    Comment: heroine, narcotics    History   Social History  . Marital Status: Married    Spouse Name: N/A  . Number of Children: N/A  . Years of Education: N/A   Social History Main Topics  . Smoking status: Current Every Day Smoker  -- 2.00 packs/day    Types: Cigarettes  . Smokeless tobacco: Never Used  . Alcohol Use: Yes  . Drug Use: Yes    Special: Marijuana, Cocaine     Comment: heroine, narcotics  . Sexual Activity: Not on file   Other Topics Concern  . None   Social History Narrative   Additional History:    Sleep: Fair  Appetite:  Fair   Assessment:   Musculoskeletal: Strength & Muscle Tone: within normal limits Gait & Station: normal Patient leans: N/A   Psychiatric Specialty Exam: Physical Exam  Review of Systems  Constitutional: Negative.   HENT: Negative.   Eyes: Negative.   Respiratory: Negative.   Cardiovascular: Negative.   Gastrointestinal: Negative.   Genitourinary: Negative.   Musculoskeletal: Negative.   Skin: Negative.   Neurological: Negative.   Endo/Heme/Allergies: Negative.   Psychiatric/Behavioral: Positive for substance abuse. The patient is nervous/anxious.     Blood pressure 118/60, pulse 84, temperature 97.7 F (36.5 C), temperature source Oral, resp. rate 18, height  (1.803 m), weight 56.246 kg (124 lb).Body mass index is 17.3 kg/(m^2).  General Appearance: Fairly Groomed  Patent attorney::  Fair  Speech:  Clear and Coherent  Volume:  Normal  Mood:  Anxious and worried  Affect:  anxious worried  Thought Process:  Coherent and Goal Directed  Orientation:  Full (Time, Place, and Person)  Thought Content:  events symptoms worries concerns  Suicidal Thoughts:  No  Homicidal Thoughts:  No  Memory:  Immediate;  Fair Recent;   Fair Remote;   Fair  Judgement:  Fair  Insight:  Present  Psychomotor Activity:  Restlessness  Concentration:  Fair  Recall:  FiservFair  Fund of Knowledge:Fair  Language: Fair  Akathisia:  No  Handed:  Right  AIMS (if indicated):     Assets:  Desire for Improvement  ADL's:  Intact  Cognition: WNL  Sleep:  Number of Hours: 6     Current Medications: Current Facility-Administered Medications  Medication Dose Route Frequency  Provider Last Rate Last Dose  . acetaminophen (TYLENOL) tablet 650 mg  650 mg Oral Q6H PRN Kerry HoughSpencer E Simon, PA-C   650 mg at 03/09/14 2147  . alum & mag hydroxide-simeth (MAALOX/MYLANTA) 200-200-20 MG/5ML suspension 30 mL  30 mL Oral Q4H PRN Kerry HoughSpencer E Simon, PA-C      . alum & mag hydroxide-simeth (MAALOX/MYLANTA) 200-200-20 MG/5ML suspension 30 mL  30 mL Oral PRN Earney NavyJosephine C Onuoha, NP      . cloNIDine (CATAPRES) tablet 0.1 mg  0.1 mg Oral BH-qamhs Rachael FeeIrving A Rylah Fukuda, MD   0.1 mg at 03/13/14 [id number]   Followed by  . [START ON 03/14/2014] cloNIDine (CATAPRES) tablet 0.1 mg  0.1 mg Oral QAC breakfast Rachael FeeIrving A Izaak Sahr, MD      . dicyclomine (BENTYL) tablet 20 mg  20 mg Oral Q6H PRN Rachael FeeIrving A Liandra Mendia, MD   20 mg at 03/10/14 1132  . feeding supplement (ENSURE COMPLETE) (ENSURE COMPLETE) liquid 237 mL  237 mL Oral BID BM Tilda FrancoLindsey Baker, RD   237 mL at 03/13/14 1439  . ibuprofen (ADVIL,MOTRIN) tablet 600 mg  600 mg Oral Q8H PRN Earney NavyJosephine C Onuoha, NP   600 mg at 03/11/14 2004  . [START ON 03/14/2014] LORazepam (ATIVAN) tablet 1 mg  1 mg Oral Daily Rachael FeeIrving A Charnel Giles, MD      . magnesium hydroxide (MILK OF MAGNESIA) suspension 30 mL  30 mL Oral Daily PRN Kerry HoughSpencer E Simon, PA-C      . meloxicam (MOBIC) tablet 7.5 mg  7.5 mg Oral BID Rachael FeeIrving A Kirrah Mustin, MD   7.5 mg at 03/13/14 1617  . methocarbamol (ROBAXIN) tablet 750 mg  750 mg Oral Q6H PRN Rachael FeeIrving A Batsheva Stevick, MD      . multivitamin with minerals tablet 1 tablet  1 tablet Oral Daily Kerry HoughSpencer E Simon, PA-C   1 tablet at 03/13/14 [id number]  . nicotine (NICODERM CQ - dosed in mg/24 hours) patch 21 mg  21 mg Transdermal Daily Rachael FeeIrving A Yoselin Amerman, MD   Stopped at 03/11/14 [phone]  . ondansetron (ZOFRAN) tablet 4 mg  4 mg Oral Q8H PRN Earney NavyJosephine C Onuoha, NP      . thiamine (VITAMIN B-1) tablet 100 mg  100 mg Oral Daily Earney NavyJosephine C Onuoha, NP   100 mg at 03/13/14 [id number]   Or  . thiamine (B-1) injection 100 mg  100 mg Intravenous Daily Earney NavyJosephine C Onuoha, NP      . thiamine (VITAMIN B-1) tablet 100 mg  100 mg  Oral Daily Kerry HoughSpencer E Simon, PA-C   100 mg at 03/11/14 [id number]  . traZODone (DESYREL) tablet 100 mg  100 mg Oral QHS,MR X 1 Rachael FeeIrving A Lavonn Maxcy, MD   100 mg at 03/12/14 2151    Lab Results: No results found for this or any previous visit (from the past 48 hour(s)).  Physical Findings: AIMS: Facial and Oral Movements Muscles of Facial Expression: None, normal Lips and Perioral Area: None, normal Jaw: None, normal Tongue: None, normal,Extremity Movements  Upper (arms, wrists, hands, fingers): None, normal Lower (legs, knees, ankles, toes): None, normal, Trunk Movements Neck, shoulders, hips: None, normal, Overall Severity Severity of abnormal movements (highest score from questions above): None, normal Incapacitation due to abnormal movements: None, normal Patient's awareness of abnormal movements (rate only patient's report): No Awareness, Dental Status Current problems with teeth and/or dentures?: Yes Does patient usually wear dentures?: No  CIWA:  CIWA-Ar Total: 3 COWS:  COWS Total Score: 6  Treatment Plan Summary: Daily contact with patient to assess and evaluate symptoms and progress in treatment and Medication management Alcohol Dependence: Will complete the detox/continue to work a relapse prevention plan                                     Facilitate admission to [facility] in the morning Acute anxiety/panic: CBT/breathing/mindfulness, trial with Seroquel 100 mg one time dose (has used it before) Medical Decision Making:  Review of Psycho-Social Stressors (1), Review of Medication Regimen & Side Effects (2) and Review of New Medication or Change in Dosage (2)     Jaquelinne Glendening A 03/13/2014, 6:26 PM

## 2014-03-13 NOTE — B[facility] Group Notes (Signed)
[facility] LCSW Group Therapy  03/13/2014 12:50 PM  Type of Therapy:  Group Therapy  Participation Level:  Active  Participation Quality:  Attentive  Affect:  Appropriate  Cognitive:  Alert and Oriented  Insight:  Improving  Engagement in Therapy:  Improving  Modes of Intervention:  Confrontation, Discussion, Education, Exploration, Problem-solving, Rapport Building, Socialization and Support  Summary of Progress/Problems: Today's Topic: Overcoming Obstacles. Pt identified obstacles faced currently and processed barriers involved in overcoming these obstacles. Pt identified steps necessary for overcoming these obstacles and explored motivation (internal and external) for facing these difficulties head on. Pt further identified one area of concern in their lives and chose a skill of focus pulled from their "toolbox." [name] was attentive and engaged during today's processing group. He shared that his only goal "is to just stay sober!" [name] talked about his plan to [facility]. He asked other group members about the process of getting into an [facility] [facility] with no money and no job. [name] shared that "I am willing to do whatever it takes this time. I'm tired of living like this."    Smart, NorthwayHeather LCSWA 03/13/2014, 12:50 PM

## 2014-03-13 NOTE — B[facility] Suicide Risk Assessment (Signed)
[facility] INPATIENT:  Family/Significant Other Suicide Prevention Education  Suicide Prevention Education:  Patient Refusal for Family/Significant Other Suicide Prevention Education: The patient Jake [name] has refused to provide written consent for family/significant other to be provided Family/Significant Other Suicide Prevention Education during admission and/or prior to discharge.  Physician notified.   SPE completed with pt. SPI pamphlet provided to pt and he was encouraged to share information with support network, ask questions, and talk about any concerns relating to SPE. Pt is no longer endorsing passive SI and reports no access to firearms.   Smart, Prudie Guthridge LCSWA  03/13/2014, 10:32 AM

## 2014-03-13 NOTE — [facility] Suicide Risk Assessment (Signed)
[facility]   Demographic Factors:  Male and Caucasian  Total Time spent with patient: 30 minutes  Musculoskeletal: Strength & Muscle Tone: within normal limits Gait & Station: normal Patient leans: N/A  Psychiatric Specialty Exam: Physical Exam  Review of Systems  Constitutional: Negative.   HENT: Negative.   Eyes: Negative.   Respiratory: Negative.   Cardiovascular: Negative.   Gastrointestinal: Negative.   Genitourinary: Negative.   Musculoskeletal: Negative.   Skin: Negative.   Neurological: Negative.   Endo/Heme/Allergies: Negative.   Psychiatric/Behavioral: Positive for substance abuse. The patient is nervous/anxious.     Blood pressure 118/60, pulse 84, temperature 97.7 F (36.5 C), temperature source Oral, resp. rate 18, height 5\' 11"  (1.803 m), weight 56.246 kg (124 lb).Body mass index is 17.3 kg/(m^2).  General Appearance: Fairly Groomed  Patent attorneyye Contact::  Fair  Speech:  Clear and Coherent409  Volume:  Normal  Mood:  Anxious  Affect:  Appropriate  Thought Process:  Coherent and Goal Directed  Orientation:  Full (Time, Place, and Person)  Thought Content:  plans as he moves on, relapse prevention plan  Suicidal Thoughts:  No  Homicidal Thoughts:  No  Memory:  Immediate;   Fair Recent;   Fair Remote;   Fair  Judgement:  Fair  Insight:  Present  Psychomotor Activity:  Restlessness  Concentration:  Fair  Recall:  FiservFair  Fund of Knowledge:Fair  Language: Fair  Akathisia:  No  Handed:  Right  AIMS (if indicated):     Assets:  Desire for Improvement  Sleep:  Number of Hours: 6  Cognition: WNL  ADL's:  Intact   Have you used any form of tobacco in the last 30 days? (Cigarettes, Smokeless Tobacco, Cigars, and/or Pipes): Yes  Has this patient used any form of tobacco in the last 30 days? (Cigarettes, Smokeless Tobacco, Cigars, and/or Pipes) Yes, A prescription for an FDA-approved tobacco cessation medication was offered at discharge  and the patient refused  Mental Status Per Nursing Assessment::   On Admission:  NA  Current Mental Status by Physician: IN full contact with reality. There are no active S/S of withdrawal. No active SI plans or intent. He is willing and motivated to [facility]    Loss Factors: NA  Historical Factors: NA  Risk Reduction Factors:   wanting to do better, resilient  Continued Clinical Symptoms:  Alcohol/Substance Abuse/Dependencies  Cognitive Features That Contribute To Risk:  Closed-mindedness, Polarized thinking and Thought constriction (tunnel vision)    Suicide Risk:  Minimal: No identifiable suicidal ideation.  Patients presenting with no risk factors but with morbid ruminations; may be classified as minimal risk based on the severity of the depressive symptoms  Principal Problem: Alcohol dependence with alcohol-induced mood disorder Discharge Diagnoses:  Patient Active Problem List   Diagnosis Date Noted  . Alcohol dependence with alcohol-induced mood disorder [F10.24] 03/09/2014  . Polysubstance abuse [F19.10] 03/09/2014  . Opioid dependence [F11.20] 03/09/2014  . Alcohol use disorder, severe, dependence [F10.20] 03/08/2014  . Substance induced mood disorder [F19.94] 03/08/2014  . Alcohol abuse [F10.10] 06/28/2013  . Alcohol withdrawal [F10.239] 06/28/2013  . DTs (delirium tremens) [F10.231] 06/28/2013  . Marijuana abuse [F12.10] 04/28/2011  . Alcoholism [F10.20] 04/27/2011  . Toxic encephalopathy [G92] 04/27/2011  . Psychosis [F29] 04/27/2011  . Leukocytosis [D72.829] 04/27/2011  . Hyponatremia [E87.1] 04/27/2011  . Thrombocytopenia [D69.6] 04/27/2011  . Elevated AST (SGOT) [R74.0] 04/27/2011  . Elevated bilirubin [R17] 04/27/2011    Follow-up  Information    Follow up with [facility] On 03/14/2014.   Why:  Arrive by 8am on this date for admission. Please bring photo ID showing [place] address and any  medications/prescriptions that you left hospital with. MRN: [id number].    Contact information:   [address] [place], [place] [address] Phone: [phone] Fax: [phone]      Follow up with [facility].   Why:  Prior to discharging from [facility], please contact Noni Saupe at [facility] [phone]) to schedule intake assessment for outpatient services (medication management/SA IOP/Individual therapy).    Contact information:   [address] [place], [place] [address] Phone: [phone] Fax: [phone]      Plan Of Care/Follow-up recommendations:  Activity:  as tolerated Diet:  regular Follow up [facility] and [facility] as above Is patient on multiple antipsychotic therapies at discharge:  No   Has Patient had three or more failed trials of antipsychotic monotherapy by history:  No  Recommended Plan for Multiple Antipsychotic Therapies: NA    Devonne Lalani A 03/13/2014, 6:38 PM

## 2014-03-13 NOTE — [facility] Group Notes (Signed)
[facility]   03/13/2014 10:26 AM  Participation Quality:  Minimal   Mood/Affect:  Depressed  Depression Rating:  10  Anxiety Rating:  10  Thoughts of Suicide:  No Will you contract for safety?   N/a   Current AVH:  No  Plan for Discharge/Comments:  Pt reports that he will try to find a ride to [facility] in the AM. Otherwise, he was agreeable to taking bus. [facility] but spoke with CSW after group and decided to go to i/p treatment. Pt also provided with [facility] list. Pt plans [facility] for [facility]/p services-CSW assessing. No withdrawals reported. "just cravings."   Transportation Means: friend or bus  Supports: some friend/no family supports identified  Counselling psychologistmart, OncologistHeather LCSWA

## 2014-03-13 NOTE — Progress Notes (Signed)
D: Pt's mood is depressed. Pt c/o withdrawal symptoms. States that he really enjoyed the [employer] meeting tonight.  A: Support given. Verbalization encouraged. Pt encouraged to come to nurse with any concerns. Medications given as prescribed. R: Pt is receptive. No complaints of pain or discomfort. Q15 min safety checks maintained. Will continue to monitor.

## 2014-03-13 NOTE — Tx Team (Signed)
Interdisciplinary Treatment Plan Update (Adult)   Date: 03/13/2014   Time Reviewed: 10:28 AM  Progress in Treatment:  Attending groups: Yes  Participating in groups:  Yes  Taking medication as prescribed: Yes  Tolerating medication: Yes  Family/Significant othe contact made: Pt refused. SPE completed with pt. Patient understands diagnosis: Yes, AEB seeking treatment for ETOH detox/opiate detox, depression, passive SI, and for medication stabilization.  Discussing patient identified problems/goals with staff: Yes  Medical problems stabilized or resolved: Yes  Denies suicidal/homicidal ideation: Yes during self report.  Patient has not harmed self or Others: Yes  New problem(s) identified:  Discharge Plan or Barriers: [facility] (03/14/14) at 8am. He is trying to find a ride but is open to taking bus. CSW assessing for appropriate o/p resources and [facility] and [facility].  Additional comments: Jake [name] is an 44 y.o. male with history of depression, anxiety, and polysubstance abuse. Per ED notes, patient with history of alcohol abuse complicated by withdrawal seizures and delirium tremens (severe hallucinations). [facility] with alcohol detox as well as opiate detox. Patient states that he drank about a 1/2 fifth of liquor yesterday. He has been drinking heavily for a few years. Patient also reports doing opiate and THC use almost daily as well.  Patient denies current SI but reports making a intentional suicide last Thursday. Patient attempted to overdose on cocaine. Sts that he had to be revived by a friend. Patient has made many previous suicide attempts stating, "I did things to put myself in a dangerous position". Patient reports depressive and anxiety symptoms. Sts that his girlfriend recently died. No HI and AVH's.Patient hospitalized many times at [facility], [facility], [facility], and etc. Sts that he  doesn't have a current outpatient mental health provider.  2/29: Pt is attending groups; minimal participation. Reporting high depression/anxiety and he is blaming this on "being homeless and not having money." Pt reports no w/d with some cravings. Pt hopeful about attending i/p treatment at d/c.  Reason for Continuation of Hospitalization: Medication stabilization Estimated length of stay: 1 day (early d/c Tues morning-[facility] admission at 8am)   For review of initial/current patient goals, please see plan of care.  Attendees:  Patient:    Family:    Physician: Jake LyonsIrving Lugo MD/Dr. Elna [name]  03/13/2014 10:28 AM   Nursing: Tomie Chinaonecia, Donna, Jan, Caroline RN 03/13/2014 10:28 AM   Clinical Social Worker Lindzy Rupert Smart, LCSWA  03/13/2014 10:28 AM   Other: Santa GeneraAnne Cunningham, LCSW; Earley AbideKristin D. LCSWA 03/13/2014 10:28 AM   Other: Darden DatesJennifer C. Nurse CM 03/13/2014 10:28 AM   Other: Liliane Badeolora Sutton, Community Care Coordinator  03/13/2014 10:28 AM   Other: Vikki PortsValerieVesta Mixer: [facility] 03/13/2014 10:28AM  Scribe for Treatment Team:  Herbert SetaHeather Smart LCSWA 03/13/2014 10:28 AM

## 2014-03-13 NOTE — Discharge Summary (Addendum)
Physician Discharge Summary Note  Patient:  Jake [name] is an 44 y.o., male MRN:  [id number] DOB:  10/29/1970 Patient phone:  [phone] (home)  Patient address:   [address] [place] [id number],  Total Time spent with patient: 30 minutes  Date of Admission:  03/08/2014 Date of Discharge: 03/14/14  Reason for Admission:  Substance abuse  Principal Problem: Alcohol dependence with alcohol-induced mood disorder Discharge Diagnoses: Patient Active Problem List   Diagnosis Date Noted  . Alcohol dependence with alcohol-induced mood disorder [F10.24] 03/09/2014  . Polysubstance abuse [F19.10] 03/09/2014  . Opioid dependence [F11.20] 03/09/2014  . Alcohol use disorder, severe, dependence [F10.20] 03/08/2014  . Substance induced mood disorder [F19.94] 03/08/2014  . Alcohol abuse [F10.10] 06/28/2013  . Alcohol withdrawal [F10.239] 06/28/2013  . DTs (delirium tremens) [F10.231] 06/28/2013  . Marijuana abuse [F12.10] 04/28/2011  . Alcoholism [F10.20] 04/27/2011  . Toxic encephalopathy [G92] 04/27/2011  . Psychosis [F29] 04/27/2011  . Leukocytosis [D72.829] 04/27/2011  . Hyponatremia [E87.1] 04/27/2011  . Thrombocytopenia [D69.6] 04/27/2011  . Elevated AST (SGOT) [R74.0] 04/27/2011  . Elevated bilirubin [R17] 04/27/2011    Musculoskeletal: Strength & Muscle Tone: within normal limits Gait & Station: normal Patient leans: N/A  Psychiatric Specialty Exam: Physical Exam  Psychiatric: He has a normal mood and affect. His speech is normal and behavior is normal. Judgment and thought content normal. Cognition and memory are normal.    Review of Systems  Constitutional: Negative.   HENT: Negative.   Eyes: Negative.   Respiratory: Negative.   Cardiovascular: Negative.   Gastrointestinal: Negative.   Genitourinary: Negative.   Musculoskeletal: Negative.   Skin: Negative.   Neurological: Negative.   Endo/Heme/Allergies: Negative.   Psychiatric/Behavioral: Positive  for substance abuse (Prior use of cocaine, alcohol, marijuana ). Negative for depression, suicidal ideas, hallucinations and memory loss. The patient is not nervous/anxious and does not have insomnia.     Blood pressure 118/60, pulse 84, temperature 97.7 F (36.5 C), temperature source Oral, resp. rate 18, height 5\' 11"  (1.803 m), weight 56.246 kg (124 lb).Body mass index is 17.3 kg/(m^2).  See Physician SRA     Past Medical History:  Past Medical History  Diagnosis Date  . ETOH abuse   . Seizures   . Hip dislocation   . Depression   . Anxiety    History reviewed. No pertinent past surgical history. Family History: History reviewed. No pertinent family history. Social History:  History  Alcohol Use  . Yes     History  Drug Use  . Yes  . Special: Marijuana, Cocaine    Comment: heroine, narcotics    History   Social History  . Marital Status: Married    Spouse Name: N/A  . Number of Children: N/A  . Years of Education: N/A   Social History Main Topics  . Smoking status: Current Every Day Smoker -- 2.00 packs/day    Types: Cigarettes  . Smokeless tobacco: Never Used  . Alcohol Use: Yes  . Drug Use: Yes    Special: Marijuana, Cocaine     Comment: heroine, narcotics  . Sexual Activity: Not on file   Other Topics Concern  . None   Social History Narrative    Risk to Self: Is patient at risk for suicide?: No What has been your use of drugs/alcohol within the last 12 months?: Cocaine and opiate use daily.  Drinks 1/2 gallon of liqour and a case of beer per day.  Risk to Others:  Prior Inpatient Therapy:   Prior Outpatient Therapy:    Level of Care:  OP  Hospital Course:  Jake [name] is an 44 y.o. male with history of depression, anxiety, and polysubstance abuse. Per ED notes, patient with history of alcohol abuse complicated by withdrawal seizures and delirium tremens (severe hallucinations). Pt prresents tow [facility] requesting help with alcohol detox as well as  opiate detox. Patient states that he drank about a 1/2 fifth of liquor yesterday. He has been drinking heavily for approximately years. Patient also reports doing opiate and THC use almost daily as well. His urine drug screen was positive for cocaine and alcohol.          Jake [name] was admitted to the adult unit. He was evaluated and his symptoms were identified. Medication management was discussed and initiated.Patient was not taking any psychiatric medications prior to admission. He complete detox protocols for alcohol and opiates. He was oriented to the unit and encouraged to participate in unit programming. Medical problems were identified and treated appropriately. Home medication was restarted as needed.        The patient was evaluated each day by a clinical provider to ascertain the patient's response to treatment.  Improvement was noted by the patient's report of decreasing symptoms, improved sleep and appetite, affect, medication tolerance, behavior, and participation in unit programming.  He was asked each day to complete a self inventory noting mood, mental status, pain, new symptoms, anxiety and concerns.         He responded well to medication and being in a therapeutic and supportive environment. Positive and appropriate behavior was noted and the patient was motivated for recovery.  The patient worked closely with the treatment team and case manager to develop a discharge plan with appropriate goals. Coping skills, problem solving as well as relaxation therapies were also part of the unit programming. He received a one time dose of Seroquel the day prior to discharge for acute anxiety. He expressed worry about maintaining his sobriety. The patient was also assisted to use CBT and breathing techniques to reduce his tension.          By the day of discharge he was in much improved condition than upon admission.  Symptoms were reported as significantly decreased or resolved completely. The  patient denied SI/HI and voiced no AVH. He was motivated to continue taking medication with a goal of continued improvement in mental health. Jake [name] was discharged home with a plan to follow up as noted below. The patient was provided with sample medications and prescriptions at time of discharge. He left [facility] in stable condition with all belongings returned to him. Patient will follow up at [facility].   Consults:  psychiatry  Significant Diagnostic Studies:  Chemistry panel, CBC, TSH, UDS positive for cocaine/marijuana   Discharge Vitals:   Blood pressure 118/60, pulse 84, temperature 97.7 F (36.5 C), temperature source Oral, resp. rate 18, height  (1.803 m), weight 56.246 kg (124 lb). Body mass index is 17.3 kg/(m^2). Lab Results:   No results found for this or any previous visit (from the past 72 hour(s)).  Physical Findings: AIMS: Facial and Oral Movements Muscles of Facial Expression: None, normal Lips and Perioral Area: None, normal Jaw: None, normal Tongue: None, normal,Extremity Movements Upper (arms, wrists, hands, fingers): None, normal Lower (legs, knees, ankles, toes): None, normal, Trunk Movements Neck, shoulders, hips: None, normal, Overall Severity Severity of abnormal movements (highest score from questions  above): None, normal Incapacitation due to abnormal movements: None, normal Patient's awareness of abnormal movements (rate only patient's report): No Awareness, Dental Status Current problems with teeth and/or dentures?: Yes Does patient usually wear dentures?: No  CIWA:  CIWA-Ar Total: 3 COWS:  COWS Total Score: 6   See Psychiatric Specialty Exam and Suicide Risk Assessment completed by Attending Physician prior to discharge.  Discharge destination:  [facility]  Is patient on multiple antipsychotic therapies at discharge:  No   Has Patient had three or more failed trials of antipsychotic monotherapy by history:   No  Recommended Plan for Multiple Antipsychotic Therapies: NA     Medication List    STOP taking these medications        DOANS EXTRA STRENGTH 580 MG Tabs  Generic drug:  Magnesium Salicylate Tetrahyd     naproxen 500 MG tablet  Commonly known as:  NAPROSYN      TAKE these medications      Indication   cyclobenzaprine 10 MG tablet  Commonly known as:  FLEXERIL  Take 1 tablet (10 mg total) by mouth 2 (two) times daily as needed for muscle spasms.      meloxicam 7.5 MG tablet  Commonly known as:  MOBIC  Take 1 tablet (7.5 mg total) by mouth 2 (two) times daily.   Indication:  Joint Damage causing Pain and Loss of Function     multivitamin with minerals Tabs tablet  Take 1 tablet by mouth daily.   Indication:  Vitamin Supplementation     traZODone 100 MG tablet  Commonly known as:  DESYREL  Take 1 tablet (100 mg total) by mouth at bedtime and may repeat dose one time if needed.   Indication:  Trouble Sleeping           Follow-up Information    Follow up with [facility] On 03/14/2014.   Why:  Arrive by 8am on this date for admission. Please bring photo ID showing [place] address and any medications/prescriptions that you left hospital with. MRN: [id number].    Contact information:   [address] [place], [place] [address] Phone: [phone] Fax: [phone]      Follow up with [facility].      Follow-up recommendations:   Activity: as tolerated Diet: regular Follow up [facility] and [facility] as above  Comments:   Take all your medications as prescribed by your mental healthcare provider.  Report any adverse effects and or reactions from your medicines to your outpatient provider promptly.  Patient is instructed and cautioned to not engage in alcohol and or illegal drug use while on prescription medicines.  In the event of worsening symptoms, patient is instructed to call the crisis hotline, 911 and or go to the nearest ED for appropriate evaluation and  treatment of symptoms.  Follow-up with your primary care provider for your other medical issues, concerns and or health care needs.   Total Discharge Time: Greater than 30 minutes  Signed: DAVIS, LAURA NP-C 03/13/2014, 12:25 PM  I personally assessed the patient and formulated the plan Madie Reno A. Dub Mikes, M.D.

## 2014-03-13 NOTE — Progress Notes (Signed)
  [facility] :  Will you be returning to the same living situation after discharge:  No-[facility] (8am on 03/14/14).  At discharge, do you have transportation home?: Yes,  pt attempting to secure ride to [facility] for tomorrow morning. If not, bus pass and instructions in chart. Pt RN made aware that pt must d/c by 6:15AM if he is unable [facility]  Do you have the ability to pay for your medications: Yes,  mental health  Release of information consent forms completed and submitted to medical records by CSW.  Patient to Follow up at: Follow-up Information    [facility].   Why:  Arrive by 8am on this date for admission. Please bring photo ID showing [place] address and any medications/prescriptions that you left hospital with. MRN: [id number].    Contact information:   [address] [place], [place] [address] Phone: [phone] Fax: [phone]      Follow up with [facility].   Why:  [facility], please contact Noni SaupeJamie Duvall at [facility] [phone]) to schedule intake assessment for outpatient services (medication management/SA IOP/Individual therapy).    Contact information:   [address] [place], [place] [address] Phone: [phone] Fax: [phone]      Patient denies SI/HI: Yes,  during group/self report.     Safety Planning and Suicide Prevention discussed: Yes,  Pt refused to consent to family contact. SPE completed with pt. SPI pamphlet provided to pt and he was encouraged to share information with support network, ask questions, and talk about any concerns relating to SPE.  Have you used any form of tobacco in the last 30 days? (Cigarettes, Smokeless Tobacco, Cigars, and/or Pipes): Yes  Has patient been referred to the Quitline?: Yes, faxed on 03/13/14  SmartHerbert Seta, [name] LCSWA  03/13/2014, 3:18 PM

## 2014-03-13 NOTE — Progress Notes (Signed)
D: Patient has anxious affect and pleasant mood. He reported on the self inventory sheet that her sleep and appetite are fair, energy level is low and ability to concentrate is poor. Patient rates depression and anxiety "10" and feelings of hopelessness "9". He's actively participating in groups, visibile in the milieu and interactive with peers in the dayroom. Patient is compliant with all medications and tolerating them well.  A: Support and encouragement provided to patient. Scheduled medications given per MD orders. Maintain Q15 minute checks for safety.  R: Patient receptive. Denies SI/HI and AVH. Patient remains safe on the hall.

## 2014-03-13 NOTE — [facility] Group Notes (Signed)
Adult Psychoeducational Group Note  Date:  03/13/2014 Time:  11:55 PM  Group Topic/Focus:  Wrap-Up Group:   The focus of this group is to help patients review their daily goal of treatment and discuss progress on daily workbooks.  Participation Level:  Minimal  Participation Quality:  Attentive  Affect:  Appropriate  Cognitive:  Appropriate  Insight: Good  Engagement in Group:  Limited  Modes of Intervention:  Discussion  Additional Comments:  [name] said his day was fine and he is Psychiatristdischarging tomorrow.  Caroll RancherLindsay, Jerron Niblack A 03/13/2014, 11:55 PM

## 2014-03-14 NOTE — Progress Notes (Signed)
Pt was [facility]   He received all follow information and samples and RX  He also received all of his belongings   He denies suicidal and homicidal ideation  He verbalized understanding of all discharge instructions

## 2014-03-14 NOTE — Progress Notes (Signed)
D   Pt is complaining of some anxiety due to withdrawal symptoms   He said he did not get his second dose of ativan today and he felt like he needed something for withdrawal    Pt did say his arrangements for a ride tomorrow for discharge were final and his ride would be here around 7:15 am   He interacts well with others and attends and participates in groups A   Verbal support given   Medications administered and effectiveness monitored   Q 15 min checks R   Pt safe at present

## 2014-03-17 NOTE — Progress Notes (Signed)
Patient Discharge Instructions:  After Visit Summary ([facility]):   Faxed to:  03/17/14 Discharge Summary Note:   Faxed to:  03/17/14 Psychiatric Admission Assessment Note:   Faxed to:  03/17/14 Suicide Risk Assessment - Discharge Assessment:   Faxed to:  03/17/14 Faxed/Sent to the Next Level Care provider:  03/17/14 [facility]ax[facility] to [facility] @ [phone] Faxed to [facility] @ [phone]  Jerelene ReddenSheena E Delta, 03/17/2014, 1:59 PM

## 2015-02-04 ENCOUNTER — Emergency Department (HOSPITAL_COMMUNITY)
Admission: EM | Admit: 2015-02-04 | Discharge: 2015-02-04 | Disposition: A | Discharge status: Home / Self Care | Payer: Self-pay | Attending: Emergency Medicine | Admitting: Emergency Medicine

## 2015-02-04 ENCOUNTER — Encounter (HOSPITAL_COMMUNITY): Payer: Self-pay | Admitting: Oncology

## 2015-02-04 DIAGNOSIS — F329 Major depressive disorder, single episode, unspecified: Secondary | ICD-10-CM | POA: Insufficient documentation

## 2015-02-04 DIAGNOSIS — F1010 Alcohol abuse, uncomplicated: Secondary | ICD-10-CM | POA: Insufficient documentation

## 2015-02-04 DIAGNOSIS — F17210 Nicotine dependence, cigarettes, uncomplicated: Secondary | ICD-10-CM | POA: Insufficient documentation

## 2015-02-04 DIAGNOSIS — F99 Mental disorder, not otherwise specified: Secondary | ICD-10-CM | POA: Insufficient documentation

## 2015-02-04 DIAGNOSIS — Z87828 Personal history of other (healed) physical injury and trauma: Secondary | ICD-10-CM | POA: Insufficient documentation

## 2015-02-04 DIAGNOSIS — R440 Auditory hallucinations: Secondary | ICD-10-CM | POA: Insufficient documentation

## 2015-02-04 DIAGNOSIS — F419 Anxiety disorder, unspecified: Secondary | ICD-10-CM | POA: Insufficient documentation

## 2015-02-04 DIAGNOSIS — Z791 Long term (current) use of non-steroidal anti-inflammatories (NSAID): Secondary | ICD-10-CM | POA: Insufficient documentation

## 2015-02-04 DIAGNOSIS — F1110 Opioid abuse, uncomplicated: Secondary | ICD-10-CM | POA: Insufficient documentation

## 2015-02-04 DIAGNOSIS — F1910 Other psychoactive substance abuse, uncomplicated: Secondary | ICD-10-CM

## 2015-02-04 MED ORDER — CHLORDIAZEPOXIDE HCL 25 MG PO CAPS: ORAL_CAPSULE | ORAL | Status: DC

## 2015-02-04 NOTE — ED Notes (Signed)
Per EMS pt presents for ETOH detox.  Pt reported to EMS that he is a polysubstance abuser and needs help.  Last drink was yesterday, he reported drinking a fifth of ETOH.

## 2015-02-04 NOTE — ED Provider Notes (Signed)
CSN: [id number]     Arrival date & time 02/04/15  0116 History  By signing my name below, I, Arlan Organ, attest that this documentation has been prepared under the direction and in the presence of Lorre Nick, MD.  Electronically Signed: Arlan Organ, ED Scribe. 02/04/2015. 1:43 AM.   Chief Complaint  Patient presents with  . Alcohol Problem    wants detox   The history is provided by the patient and the EMS personnel. No language interpreter was used.   HPI Comments: Jake [name] brought in by EMS is a 45 y.o. male with PMHx of ETOH abuse, substance abuse, seizures, depression and anxiety who presents to the Emergency Department here for an alcohol problem this evening. Pt admits to alcohol and illicit drug abuse. He states he consumed one fifth of vodka PTA, along with snorting heroin, methadone, and OxyContin. Pt admits to experiencing shakes and auditory hallucinations. Pt normally drinks a twelve-pack of beer or fifth of vodka per day. Pt notes he was in rehab less than 9 months ago. He admits to previously being hospitalized for withdrawals. Denies SI or HI at this time.  PCP: No PCP Per Patient   Past Medical History  Diagnosis Date  . ETOH abuse   . Seizures (HCC)   . Hip dislocation   . Depression   . Anxiety    History reviewed. No pertinent past surgical history. History reviewed. No pertinent family history. Social History  Substance Use Topics  . Smoking status: Current Every Day Smoker -- 2.00 packs/day    Types: Cigarettes  . Smokeless tobacco: Never Used  . Alcohol Use: Yes    Review of Systems  Constitutional: Negative for fever.  Psychiatric/Behavioral: Positive for hallucinations (auditory). Negative for suicidal ideas.  All other systems reviewed and are negative.  Allergies  Review of patient's allergies indicates no known allergies.  Home Medications   Prior to Admission medications   Medication Sig Start Date End Date Taking? Authorizing  Provider  cyclobenzaprine (FLEXERIL) 10 MG tablet Take 1 tablet (10 mg total) by mouth 2 (two) times daily as needed for muscle spasms. 10/24/13   Linwood Dibbles, MD  meloxicam (MOBIC) 7.5 MG tablet Take 1 tablet (7.5 mg total) by mouth 2 (two) times daily. 03/13/14   Thermon Leyland, NP  Multiple Vitamin (MULTIVITAMIN WITH MINERALS) TABS tablet Take 1 tablet by mouth daily. 03/13/14   Thermon Leyland, NP  traZODone (DESYREL) 100 MG tablet Take 1 tablet (100 mg total) by mouth at bedtime and may repeat dose one time if needed. 03/13/14   Thermon Leyland, NP   BP 113/90 mmHg  Pulse 96  Temp(Src) 98.2 F (36.8 C) (Oral)  Resp 16  Ht  (1.854 m)  Wt 140 lb (63.504 kg)  BMI 18.47 kg/m2  SpO2 95% Physical Exam  Constitutional: He is oriented to person, place, and time. He appears well-developed and well-nourished.  Non-toxic appearance. No distress.  HENT:  Head: Normocephalic and atraumatic.  Eyes: Conjunctivae, EOM and lids are normal. Pupils are equal, round, and reactive to light.  Neck: Normal range of motion. Neck supple. No tracheal deviation present. No thyroid mass present.  Cardiovascular: Normal rate, regular rhythm and normal heart sounds.  Exam reveals no gallop.   No murmur heard. Pulmonary/Chest: Effort normal and breath sounds normal. No stridor. No respiratory distress. He has no decreased breath sounds. He has no wheezes. He has no rhonchi. He has no rales.  Abdominal: Soft.  Normal appearance and bowel sounds are normal. He exhibits no distension. There is no tenderness. There is no rebound and no CVA tenderness.  Musculoskeletal: Normal range of motion. He exhibits no edema or tenderness.  Neurological: He is alert and oriented to person, place, and time. He has normal strength. No cranial nerve deficit or sensory deficit. GCS eye subscore is 4. GCS verbal subscore is 5. GCS motor subscore is 6.  Skin: Skin is warm and dry. No abrasion and no rash noted.  Psychiatric: He has a normal  mood and affect. His behavior is normal. His speech is not slurred.  No visual signs of shaking.  Nursing note and vitals reviewed.   ED Course  Procedures  DIAGNOSTIC STUDIES: Oxygen Saturation is 95% on RA, adequate by my interpretation.    COORDINATION OF CARE:  1:27 AM- Discussed treatment plan with pt at bedside and pt agreed to plan.   Labs Review Labs Reviewed - No data to display  Imaging Review No results found. I have personally reviewed and evaluated these images and lab results as part of my medical decision-making.   EKG Interpretation None      MDM   Final diagnoses:  None    Pt w/o acute psychiatric condition, will tx with outpt librium detox and referrals given  Lorre Nick, MD 02/07/15 2216

## 2015-02-04 NOTE — Discharge Instructions (Signed)
Substance Abuse Treatment Programs ° °Intensive Outpatient Programs °[facility]     °[address]      °[place], [place]                   °[phone]      ° °[facility] °[address] °[place], [place] °336-379-7146 ° °[facility]     °(Inpatient and outpatient)     °[address]           °336-832-9800   ° °[facility] °336-288-1484 (Suboxone and Methadone) ° °[address]      °[place], [place] [address]      °336-882-2125      ° °[address] °[place], [place] °852-3033 ° °[facility] (Outpatient/Inpatient, [facility])    °(insurance only) [phone]      °       °[facility] (Groups & Residential) °[place], [place] °336-389-1413 ° °   °[facility]     °[address]     °[place], [place]      °336-389-1413      ° °[facility] (for caregivers and family) °[address] °[place], [place] °336-299-4655 ° ° ° ° ° °Residential Treatment Programs °[facility]      °[address], [place], [place] [address]  °(336) [phone]      ° °[facility] °[address] [place], [place] [address] °919-419-1059 ° °[facility]        °336-248-8914      ° °[facility] °1-800-659-3381 ° °[facility]             °[address]                                         °[place], [place]                                                °877-615-2722 or 336-784-9470                              ° °[facility] [place] °[address] °[place], [address] °1.877.941.8954 ° °[facility]    °[address]      °[place], [place]     °336-273-5306      ° °[facility] °[address] °, [place] °336-285-9073 ° °[facility]   °[address]     °[place], [place] [address]     °336-899-1550      °Admissions: 8am-3pm M-F ° °[facility] °[address] °[place],  [place] °336-227-7417 ° °[facility]: Residential Program (90 Days)   °[place], [place]      °336-725-8389 or 800-758-6077    ° °[facility] °[place], [place] °(Walk in Hours over the weekend or by referral) ° °[facility] °[address], [place], [place] [address] °(336) 723-1848 ° °Crisis Mobile: [facility]:  1-877-626-1772 (for crisis response 24 hours a day) °[facility] Hotline:      1-800-256-2452 °Outpatient Psychiatry and Counseling ° °[facility]: Mobile Crisis   Management 24 hours:  1-877-626-1772 ° °[facility] sliding scale fee and walk in schedule: M-F 8am-12pm/1pm-3pm °[address]  °[place], [place] [address] °336-387-6161 ° °[facility] °[address] [place] °[place], [place] [address] °336-703-9650 ° °[facility] ([facility])- new patient walk-in appointments available Monday - Friday 8am -3pm.          °[address] °[place], [place] [address] °336-676-6840 or crisis line- 336-676-6905 ° °[facility]/ Intensive Outpatient Therapy Program °[address] °[place], [place] [address] °336-832-9804 ° °[facility]                  °Crisis Services      °336.641.4993      °[address]     °[place], [place] [address]                ° °[facility]   °[facility] °[phone] °[address] °[place], [place] [address] ° ° °[facility]          °[address]  °[place], [place] [address]       °(336) [phone] ° °[facility] °[address] °[place], [place] [address] °336-292-1510 ° °[facility]    °[address]    °[place], [place] [address]     °336-632-3505      ° °Parish McKinney, MD     °[address]     °[place] [address]     °336-282-1251     °  °[facility] °[address] °[place] [address] ° °[facility]     °[address]     °[place], [place]      °336-542-2076      ° °[facility] °Shamsher Ahluwalia, MD °[address] °[place], [place] [address] °336-420-9558 ° °[facility]     °[address]     °[place], [place] [address]     °336-274-1237      ° °[facility] °[address] °[place], [place] [address] °336-854-4450 °Resources for Temporary Residential Assistance/Crisis Centers ° °DAY CENTERS °[facility] °M-F 8am-3pm   °[address] [place], [place] [address]   336-332-0824 °Services include: laundry, barbering, support groups, case management, phone  & computer access, showers, AA/NA mtgs, mental health/substance abuse nurse, job skills class, disability information, VA assistance, spiritual classes, etc.  ° °HOMELESS SHELTERS ° °[facility]     °[facility]   °[address], [place]     °336.271.5959       °       °[facility] (women and children)       °[address] °[place], [place] [address] °336-275-0820 °[email or web address] for application and process °Application Required ° °[facility]   °[address]    °[place]  [address]     °336.886.4922       °             °[facility] °[address] °,  [address] °336.273.5572 °336-235-0363(schedule application appt.) °Application Required ° °[facility] (women only)    °[address]     °[place],  [address]     °336-884-1039      °  Intake starts 6pm daily °Need valid ID, SSC, & Police report °[facility] [place] °[address] °[place], [place] °336-881-5420 °Application Required ° °[facility] (men only)     °[address]      °[place], [place]     °336.748.1962      ° °[facility] °(Pregnant women only) °[address] °[place], [place] °336-275-0206 ° °[facility]      °[address]      °[place], [place] [address]     °336-722-9951      °       °[facility] °[address] °[place], [place] °336-723-1848 °90 day commitment/SA/Application process ° °[facility](men only)     °[address]     °[place], [place]     °336-748-1962       °Check-in at 7pm     °       °[facility] °[address] °[place], [place] [address] °336-248-6684 °Men/Women/Women and Children must be there by 7 pm ° °[facility] °[place], [place] °336-722-8721                ° °

## 2015-04-01 ENCOUNTER — Emergency Department (HOSPITAL_COMMUNITY)
Admission: EM | Admit: 2015-04-01 | Discharge: 2015-04-01 | Disposition: A | Discharge status: Home / Self Care | Payer: Self-pay | Attending: Emergency Medicine | Admitting: Emergency Medicine

## 2015-04-01 ENCOUNTER — Encounter (HOSPITAL_COMMUNITY): Payer: Self-pay | Admitting: *Deleted

## 2015-04-01 ENCOUNTER — Emergency Department (HOSPITAL_COMMUNITY): Payer: Self-pay

## 2015-04-01 DIAGNOSIS — F419 Anxiety disorder, unspecified: Secondary | ICD-10-CM | POA: Insufficient documentation

## 2015-04-01 DIAGNOSIS — J0120 Acute ethmoidal sinusitis, unspecified: Secondary | ICD-10-CM | POA: Insufficient documentation

## 2015-04-01 DIAGNOSIS — F17210 Nicotine dependence, cigarettes, uncomplicated: Secondary | ICD-10-CM | POA: Insufficient documentation

## 2015-04-01 DIAGNOSIS — Z87828 Personal history of other (healed) physical injury and trauma: Secondary | ICD-10-CM | POA: Insufficient documentation

## 2015-04-01 MED ORDER — AZITHROMYCIN 250 MG PO TABS: ORAL_TABLET | ORAL | Status: DC

## 2015-04-01 NOTE — Discharge Instructions (Signed)
Zithromax as prescribed.  Ibuprofen 600 mg every 6 hours as needed pain.  Follow-up with your primary Dr. if not improving in the next week.   Sinusitis, Adult Sinusitis is redness, soreness, and inflammation of the paranasal sinuses. Paranasal sinuses are air pockets within the bones of your face. They are located beneath your eyes, in the middle of your forehead, and above your eyes. In healthy paranasal sinuses, mucus is able to drain out, and air is able to circulate through them by way of your nose. However, when your paranasal sinuses are inflamed, mucus and air can become trapped. This can allow bacteria and other germs to grow and cause infection. Sinusitis can develop quickly and last only a short time (acute) or continue over a long period (chronic). Sinusitis that lasts for more than 12 weeks is considered chronic. CAUSES Causes of sinusitis include:  Allergies.  Structural abnormalities, such as displacement of the cartilage that separates your nostrils (deviated septum), which can decrease the air flow through your nose and sinuses and affect sinus drainage.  Functional abnormalities, such as when the small hairs (cilia) that line your sinuses and help remove mucus do not work properly or are not present. SIGNS AND SYMPTOMS Symptoms of acute and chronic sinusitis are the same. The primary symptoms are pain and pressure around the affected sinuses. Other symptoms include:  Upper toothache.  Earache.  Headache.  Bad breath.  Decreased sense of smell and taste.  A cough, which worsens when you are lying flat.  Fatigue.  Fever.  Thick drainage from your nose, which often is green and may contain pus (purulent).  Swelling and warmth over the affected sinuses. DIAGNOSIS Your health care provider will perform a physical exam. During your exam, your health care provider may perform any of the following to help determine if you have acute sinusitis or chronic  sinusitis:  Look in your nose for signs of abnormal growths in your nostrils (nasal polyps).  Tap over the affected sinus to check for signs of infection.  View the inside of your sinuses using an imaging device that has a light attached (endoscope). If your health care provider suspects that you have chronic sinusitis, one or more of the following tests may be recommended:  Allergy tests.  Nasal culture. A sample of mucus is taken from your nose, sent to a lab, and screened for bacteria.  Nasal cytology. A sample of mucus is taken from your nose and examined by your health care provider to determine if your sinusitis is related to an allergy. TREATMENT Most cases of acute sinusitis are related to a viral infection and will resolve on their own within 10 days. Sometimes, medicines are prescribed to help relieve symptoms of both acute and chronic sinusitis. These may include pain medicines, decongestants, nasal steroid sprays, or saline sprays. However, for sinusitis related to a bacterial infection, your health care provider will prescribe antibiotic medicines. These are medicines that will help kill the bacteria causing the infection. Rarely, sinusitis is caused by a fungal infection. In these cases, your health care provider will prescribe antifungal medicine. For some cases of chronic sinusitis, surgery is needed. Generally, these are cases in which sinusitis recurs more than 3 times per year, despite other treatments. HOME CARE INSTRUCTIONS  Drink plenty of water. Water helps thin the mucus so your sinuses can drain more easily.  Use a humidifier.  Inhale steam 3-4 times a day (for example, sit in the bathroom with the shower running).  Apply a warm, moist washcloth to your face 3-4 times a day, or as directed by your health care provider.  Use saline nasal sprays to help moisten and clean your sinuses.  Take medicines only as directed by your health care provider.  If you were  prescribed either an antibiotic or antifungal medicine, finish it all even if you start to feel better. SEEK IMMEDIATE MEDICAL CARE IF:  You have increasing pain or severe headaches.  You have nausea, vomiting, or drowsiness.  You have swelling around your face.  You have vision problems.  You have a stiff neck.  You have difficulty breathing.   This information is not intended to replace advice given to you by your health care provider. Make sure you discuss any questions you have with your health care provider.   Document Released: 12/30/2004 Document Revised: 01/20/2014 Document Reviewed: 01/14/2011 Elsevier Interactive Patient Education [employer]2016 Elsevier Inc.

## 2015-04-01 NOTE — ED Notes (Signed)
Pt reports headache x 2 months; pt states that his head hurts to left temporal area; pt denies N/V; pt states that he feels like he needs to concentrate to see small print more than usual; pt denies hx of migraines; pt states that he has light sensitivity but no sound sensitivity

## 2015-04-01 NOTE — ED Notes (Signed)
Patient transported to CT 

## 2015-04-01 NOTE — ED Provider Notes (Signed)
CSN: [id number]     Arrival date & time 04/01/15  0414 History   First MD Initiated Contact with Patient 04/01/15 0455     Chief Complaint  Patient presents with  . Headache     (Consider location/radiation/quality/duration/timing/severity/associated sxs/prior Treatment) HPI Comments: Patient is a 45 year old male with history of alcohol abuse, seizures, and depression. He presents for evaluation of headache. He reports he has had a constant pain in his left temple that radiates through to the back of his head for the past month. This began in the absence of any injury or trauma. He denies any fevers, chills, or neck pain. He denies any visual disturbances. He denies any numbness or tingling of his extremities.  Patient is a 45 y.o. male presenting with headaches. The history is provided by the patient.  Headache Pain location:  Generalized Quality:  Dull Radiates to:  Does not radiate Onset quality:  Sudden Duration:  4 weeks Timing:  Constant Progression:  Worsening Chronicity:  New Similar to prior headaches: no   Context: bright light   Relieved by:  Nothing Worsened by:  Nothing Ineffective treatments:  None tried   Past Medical History  Diagnosis Date  . ETOH abuse   . Seizures (HCC)   . Hip dislocation   . Depression   . Anxiety    History reviewed. No pertinent past surgical history. No family history on file. Social History  Substance Use Topics  . Smoking status: Current Every Day Smoker -- 2.00 packs/day    Types: Cigarettes  . Smokeless tobacco: Never Used  . Alcohol Use: Yes    Review of Systems  Neurological: Positive for headaches.  All other systems reviewed and are negative.     Allergies  Review of patient's allergies indicates no known allergies.  Home Medications   Prior to Admission medications   Medication Sig Start Date End Date Taking? Authorizing Provider  chlordiazePOXIDE (LIBRIUM) 25 MG capsule  PO TID x 1D, then 25-50mg  PO  BID X 1D, then 25-50mg  PO QD X 1D 02/04/15   Lorre Nick, MD  cyclobenzaprine (FLEXERIL) 10 MG tablet Take 1 tablet (10 mg total) by mouth 2 (two) times daily as needed for muscle spasms. Patient not taking: Reported on 02/04/2015 10/24/13   Linwood Dibbles, MD  meloxicam (MOBIC) 7.5 MG tablet Take 1 tablet (7.5 mg total) by mouth 2 (two) times daily. Patient not taking: Reported on 02/04/2015 03/13/14   Thermon Leyland, NP  Multiple Vitamin (MULTIVITAMIN WITH MINERALS) TABS tablet Take 1 tablet by mouth daily. Patient not taking: Reported on 02/04/2015 03/13/14   Thermon Leyland, NP  traZODone (DESYREL) 100 MG tablet Take 1 tablet (100 mg total) by mouth at bedtime and may repeat dose one time if needed. Patient not taking: Reported on 02/04/2015 03/13/14   Thermon Leyland, NP   BP 102/73 mmHg  Pulse 90  Temp(Src) 97.8 F (36.6 C) (Oral)  Resp 20  SpO2 94% Physical Exam  Constitutional: He is oriented to person, place, and time. He appears well-developed and well-nourished. No distress.  HENT:  Head: Normocephalic and atraumatic.  Mouth/Throat: Oropharynx is clear and moist.  Eyes: EOM are normal. Pupils are equal, round, and reactive to light.  There is no papilledema on funduscopic exam  Neck: Normal range of motion. Neck supple.  Cardiovascular: Normal rate, regular rhythm and normal heart sounds.   No murmur heard. Pulmonary/Chest: Effort normal and breath sounds normal. No respiratory distress. He has no wheezes.  Abdominal: Soft. Bowel sounds are normal. He exhibits no distension. There is no tenderness.  Musculoskeletal: Normal range of motion. He exhibits no edema.  Neurological: He is alert and oriented to person, place, and time. No cranial nerve deficit. He exhibits normal muscle tone. Coordination normal.  Skin: Skin is warm and dry. He is not diaphoretic.  Nursing note and vitals reviewed.   ED Course  Procedures (including critical care time) Labs Review Labs Reviewed - No data to  display  Imaging Review No results found. I have personally reviewed and evaluated these images and lab results as part of my medical decision-making.    MDM   Final diagnoses:  None    Patient is a [age] year old male who presents with a one-month history of left sided headache. His neurologic exam is nonfocal and he is afebrile. CT scan today reveals left-sided ethmoid sinusitis, however no other acute intracranial pathology. Suspect he has experiencing a sinus headache. This will be treated with Zithromax and when necessary return.    Geoffery Lyonsouglas Adriahna Shearman, MD 04/01/15 [id number]

## 2015-04-06 ENCOUNTER — Encounter (HOSPITAL_COMMUNITY): Payer: Self-pay | Admitting: Emergency Medicine

## 2015-04-06 ENCOUNTER — Emergency Department (HOSPITAL_COMMUNITY)
Admission: EM | Admit: 2015-04-06 | Discharge: 2015-04-06 | Disposition: A | Discharge status: Other Acute Inpatient Hospital | Payer: Federal, State, Local not specified - Other | Attending: Emergency Medicine | Admitting: Emergency Medicine

## 2015-04-06 ENCOUNTER — Emergency Department (HOSPITAL_COMMUNITY): Payer: Self-pay

## 2015-04-06 ENCOUNTER — Observation Stay (HOSPITAL_COMMUNITY)
Admission: AD | Admit: 2015-04-06 | Discharge: 2015-04-08 | Disposition: A | Discharge status: Home / Self Care | Payer: Federal, State, Local not specified - Other | Source: Intra-hospital | Attending: Psychiatry | Admitting: Psychiatry

## 2015-04-06 ENCOUNTER — Encounter (HOSPITAL_COMMUNITY): Payer: Self-pay | Admitting: *Deleted

## 2015-04-06 DIAGNOSIS — F1094 Alcohol use, unspecified with alcohol-induced mood disorder: Secondary | ICD-10-CM | POA: Diagnosis present

## 2015-04-06 DIAGNOSIS — F10239 Alcohol dependence with withdrawal, unspecified: Principal | ICD-10-CM | POA: Insufficient documentation

## 2015-04-06 DIAGNOSIS — F1120 Opioid dependence, uncomplicated: Secondary | ICD-10-CM | POA: Insufficient documentation

## 2015-04-06 DIAGNOSIS — F1210 Cannabis abuse, uncomplicated: Secondary | ICD-10-CM | POA: Insufficient documentation

## 2015-04-06 DIAGNOSIS — F1024 Alcohol dependence with alcohol-induced mood disorder: Secondary | ICD-10-CM | POA: Insufficient documentation

## 2015-04-06 DIAGNOSIS — Z590 Homelessness: Secondary | ICD-10-CM | POA: Insufficient documentation

## 2015-04-06 DIAGNOSIS — S0181XA Laceration without foreign body of other part of head, initial encounter: Secondary | ICD-10-CM | POA: Insufficient documentation

## 2015-04-06 DIAGNOSIS — R45851 Suicidal ideations: Secondary | ICD-10-CM | POA: Insufficient documentation

## 2015-04-06 DIAGNOSIS — Y9389 Activity, other specified: Secondary | ICD-10-CM | POA: Insufficient documentation

## 2015-04-06 DIAGNOSIS — F1020 Alcohol dependence, uncomplicated: Secondary | ICD-10-CM | POA: Diagnosis present

## 2015-04-06 DIAGNOSIS — R748 Abnormal levels of other serum enzymes: Secondary | ICD-10-CM | POA: Insufficient documentation

## 2015-04-06 DIAGNOSIS — Y998 Other external cause status: Secondary | ICD-10-CM | POA: Insufficient documentation

## 2015-04-06 DIAGNOSIS — F17210 Nicotine dependence, cigarettes, uncomplicated: Secondary | ICD-10-CM | POA: Insufficient documentation

## 2015-04-06 DIAGNOSIS — Z23 Encounter for immunization: Secondary | ICD-10-CM | POA: Insufficient documentation

## 2015-04-06 DIAGNOSIS — Y9289 Other specified places as the place of occurrence of the external cause: Secondary | ICD-10-CM | POA: Insufficient documentation

## 2015-04-06 DIAGNOSIS — F329 Major depressive disorder, single episode, unspecified: Secondary | ICD-10-CM | POA: Insufficient documentation

## 2015-04-06 DIAGNOSIS — F419 Anxiety disorder, unspecified: Secondary | ICD-10-CM | POA: Insufficient documentation

## 2015-04-06 LAB — RAPID URINE DRUG SCREEN, HOSP PERFORMED
Amphetamines: NOT DETECTED
BARBITURATES: NOT DETECTED
BENZODIAZEPINES: NOT DETECTED
Cocaine: NOT DETECTED
Opiates: NOT DETECTED
Tetrahydrocannabinol: NOT DETECTED

## 2015-04-06 LAB — CBC
HEMATOCRIT: 43.1 % (ref 39.0–52.0)
Hemoglobin: 14.6 g/dL (ref 13.0–17.0)
MCH: 29.9 pg (ref 26.0–34.0)
MCHC: 33.9 g/dL (ref 30.0–36.0)
MCV: 88.1 fL (ref 78.0–100.0)
PLATELETS: 308 10*3/uL (ref 150–400)
RBC: 4.89 MIL/uL (ref 4.22–5.81)
RDW: 13.6 % (ref 11.5–15.5)
WBC: 6.6 10*3/uL (ref 4.0–10.5)

## 2015-04-06 LAB — COMPREHENSIVE METABOLIC PANEL
ALT: 160 U/L — AB (ref 17–63)
AST: 173 U/L — ABNORMAL HIGH (ref 15–41)
Albumin: 4 g/dL (ref 3.5–5.0)
Alkaline Phosphatase: 102 U/L (ref 38–126)
Anion gap: 10 (ref 5–15)
BUN: 5 mg/dL — ABNORMAL LOW (ref 6–20)
CHLORIDE: 106 mmol/L (ref 101–111)
CO2: 26 mmol/L (ref 22–32)
Calcium: 8.6 mg/dL — ABNORMAL LOW (ref 8.9–10.3)
Creatinine, Ser: 0.71 mg/dL (ref 0.61–1.24)
Glucose, Bld: 106 mg/dL — ABNORMAL HIGH (ref 65–99)
Potassium: 3.7 mmol/L (ref 3.5–5.1)
Sodium: 142 mmol/L (ref 135–145)
Total Bilirubin: 0.3 mg/dL (ref 0.3–1.2)
Total Protein: 7.3 g/dL (ref 6.5–8.1)

## 2015-04-06 LAB — ETHANOL: ALCOHOL ETHYL (B): 367 mg/dL — AB (ref <5)

## 2015-04-06 MED ORDER — LORAZEPAM 1 MG PO TABS
0.0000 mg | ORAL_TABLET | Freq: Four times a day (QID) | ORAL | Status: DC
  Administered 2015-04-06: 1 mg via ORAL
  Filled 2015-04-06: qty 1

## 2015-04-06 MED ORDER — LORAZEPAM 1 MG PO TABS: 0.0000 mg | ORAL_TABLET | Freq: Two times a day (BID) | ORAL | Status: DC

## 2015-04-06 MED ORDER — ACETAMINOPHEN 325 MG PO TABS: 650.0000 mg | ORAL_TABLET | Freq: Four times a day (QID) | ORAL | Status: DC | PRN

## 2015-04-06 MED ORDER — LIDOCAINE-EPINEPHRINE (PF) 2 %-1:200000 IJ SOLN
INTRAMUSCULAR | Status: AC
  Filled 2015-04-06: qty 20

## 2015-04-06 MED ORDER — LORAZEPAM 1 MG PO TABS
0.0000 mg | ORAL_TABLET | Freq: Four times a day (QID) | ORAL | Status: DC
  Administered 2015-04-07 (×2): 1 mg via ORAL
  Filled 2015-04-06 (×2): qty 1

## 2015-04-06 MED ORDER — ALUM & MAG HYDROXIDE-SIMETH 200-200-20 MG/5ML PO SUSP: 30.0000 mL | ORAL | Status: DC | PRN

## 2015-04-06 MED ORDER — IBUPROFEN 800 MG PO TABS
800.0000 mg | ORAL_TABLET | Freq: Once | ORAL | Status: AC
  Administered 2015-04-06: 800 mg via ORAL
  Filled 2015-04-06: qty 1

## 2015-04-06 MED ORDER — MAGNESIUM HYDROXIDE 400 MG/5ML PO SUSP: 30.0000 mL | Freq: Every day | ORAL | Status: DC | PRN

## 2015-04-06 MED ORDER — TETANUS-DIPHTH-ACELL PERTUSSIS 5-2.5-18.5 LF-MCG/0.5 IM SUSP
0.5000 mL | Freq: Once | INTRAMUSCULAR | Status: AC
  Administered 2015-04-06: 0.5 mL via INTRAMUSCULAR
  Filled 2015-04-06: qty 0.5

## 2015-04-06 MED ORDER — LIDOCAINE-EPINEPHRINE (PF) 2 %-1:200000 IJ SOLN
10.0000 mL | Freq: Once | INTRAMUSCULAR | Status: AC
  Administered 2015-04-06: 10 mL

## 2015-04-06 NOTE — Progress Notes (Signed)
[facility], bed 1. Services of Dr. Lucianne MussKumar, MD.   Bed expected to be available this afternoon.  Rosey BathKelly Kathe Wirick, RN

## 2015-04-06 NOTE — Progress Notes (Signed)
[facility] INPATIENT:  Family/Significant Other Suicide Prevention Education  Suicide Prevention Education:  Patient Refusal for Family/Significant Other Suicide Prevention Education: The patient Jake [name] has refused to provide written consent for family/significant other to be provided Family/Significant Other Suicide Prevention Education during admission and/or prior to discharge.  Physician notified.  Glenice LaineIbekwe, Ramsey Guadamuz B 04/06/2015, 11:28 PM

## 2015-04-06 NOTE — Progress Notes (Addendum)
CM spoke with [place] who [place].  CM discussed and provided written information for uninsured accepting pcps, discussed the importance of pcp vs EDP services for f/u care, www.needymeds.org, www.goodrx.com, discounted pharmacies and other [place] resources such as [facility] , [facility], affordable care act, financial assistance, uninsured dental services, [facility] med assist, DSS and  health department  Reviewed resources for [place] uninsured accepting pcps like [facility], family medicine at [address], [facility] [place], [facility], local urgent care centers, [facility], [facility], general medical clinics, [facility], [facility] urgent care plus others, medication resources, [facility] out patient pharmacies and housing Pt voiced understanding and appreciation of resources provided   Provided [facility] contact information Pt agreed to a referral Cm completed referral Pt to be contact by [facility] clinical liason  Entered in d/c instructions  Please use the resources provided to you in emergency room by case manager to assist with doctor for follow up  A referral for you has been sent to [facility] if you have not received a call in 3 days you may contact them Call Scherry RanKaren Andrianos at [phone] Tuesday-Friday [email or web address]  These [place] uninsured resources provide possible primary care providers, resources for discounted medications, housing, dental resources, affordable care act information, plus other resources for [place]

## 2015-04-06 NOTE — BH Assessment (Addendum)
[facility] Assessment Progress Note  Per Thedore MinsMojeed Akintayo, MD, this pt would benefit [facility] at [facility].  Rosey BathKelly Southard, RN, [facility] has [facility] pt to Obs 1 after the Observation Unit opens at 19:00.  Pt has signed Voluntary Admission and Consent for Treatment, as well as Consent to Release Information to no one, and signed forms have [facility].  Pt's nurse has been notified, and agrees to send original paperwork along with pt via [place] [place], and to call report to [phone] or [phone].  Doylene Canninghomas Marika Mahaffy, MA Triage Specialist [phone]
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
## 2015-04-06 NOTE — H&P (Signed)
Psychiatric [facility]-Observation Unit Assessment Adult  Patient Identification: Jake [name] MRN:  [id number] Date of Evaluation:  04/06/2015 Chief Complaint:  "I plan on jumping in front of a train if I can't get help."  Principal Diagnosis: Alcohol dependence with alcohol-induced mood disorder ([place]) Diagnosis:   Patient Active Problem List   Diagnosis Date Noted  . Alcohol dependence with alcohol-induced mood disorder ([place]) [F10.24] 03/09/2014  . Polysubstance abuse [F19.10] 03/09/2014  . Opioid dependence ([place]) [F11.20] 03/09/2014  . Alcohol use disorder, severe, dependence ([place]) [F10.20] 03/08/2014  . Substance induced mood disorder ([place]) [F19.94] 03/08/2014  . Alcohol abuse [F10.10] 06/28/2013  . Alcohol withdrawal ([place]) [F10.239] 06/28/2013  . DTs (delirium tremens) ([place]) [F10.231] 06/28/2013  . Marijuana abuse [F12.10] 04/28/2011  . Alcoholism ([place]) [F10.20] 04/27/2011  . Toxic encephalopathy [G92] 04/27/2011  . Psychosis [F29] 04/27/2011  . Leukocytosis [D72.829] 04/27/2011  . Hyponatremia [E87.1] 04/27/2011  . Thrombocytopenia ([place]) [D69.6] 04/27/2011  . Elevated AST (SGOT) [R74.0] 04/27/2011  . Elevated bilirubin [R17] 04/27/2011   History of Present Illness::   Jake [name] is an 45 y.o. male presenting to [facility] reporting suicidal ideations with a plan to jump in front of the [place] train. He reports severe depressive symptoms that appear to be related to persistent alcohol abuse. Patient states "I have been drinking beer or liquor for at least a year. I left [facility] last year and went to [facility]. I was sober for about a month. I feel very depressed. I don't have a steady job and now I am living in a tent. I have no stability. I have been drinking a whole lot. I am very shaky, sweaty, and having tremors right now." The patient continues to endorse symptoms of depression and of acute alcohol withdrawal. His alcohol level on arrival to the ED was 367. Patient is currently on an  ativan taper and his vitals are stable. He reports a history of seizures but currently appears stable while receiving medical treatment with the ativan taper. Patient would like to explore what treatment options are available to him. His liver enzymes are elevated which suggest chronic alcohol abuse.   Associated Signs/Symptoms: Depression Symptoms:  depressed mood, anhedonia, insomnia, suicidal thoughts with specific plan, anxiety, loss of energy/fatigue, disturbed sleep, (Hypo) Manic Symptoms:  Irritable Mood, Anxiety Symptoms:  Excessive Worry, Psychotic Symptoms:  Denies PTSD Symptoms: NA Total Time spent with patient: 45 minutes  Past Psychiatric History: Alcohol abuse, Heroin abuse in remission   Is the patient at risk to self? Yes.    Has the patient been a risk to self in the past 6 months? Yes.    Has the patient been a risk to self within the distant past? Yes.    Is the patient a risk to others? No.  Has the patient been a risk to others in the past 6 months? No.  Has the patient been a risk to others within the distant past? No.   Prior Inpatient Therapy: Prior Inpatient Therapy: Yes Prior Therapy Dates: 2016 Prior Therapy Facilty/Provider(s): [facility], [facility], [facility], [facility]  Reason for Treatment: Substance abuse tx.  Prior Outpatient Therapy: Prior Outpatient Therapy: No Does patient have an ACCT team?: No Does patient have Intensive In-House Services?  : No Does patient have [facility] services? : No Does patient have P4CC services?: No  Alcohol Screening:   Substance Abuse History in the last 12 months:  Yes.   Consequences of Substance Abuse: Withdrawal Symptoms:   Cramps Diaphoresis Headaches  Tremors Previous Psychotropic Medications: Yes  Psychological Evaluations: No  Past Medical History:  Past Medical History  Diagnosis Date  . ETOH abuse   . Seizures ([place])   . Hip dislocation   . Depression   . Anxiety    History reviewed. No pertinent  past surgical history. Family History: No family history on file. Family Psychiatric  History: Father with alcohol abuse Tobacco Screening: Negative  Social History:  History  Alcohol Use  . Yes    History  Drug Use  . Yes  . Special: Marijuana, Cocaine    Comment: herion, narcotics, benzo's, stimulants    Additional Social History: Marital status: Single    Pain Medications: Oxycotin, Roxy's,Diluadid Prescriptions: Xananx, Oxycotin, Roxy's, Diluadid  History of alcohol / drug use?: Yes Longest period of sobriety (when/how long): 6 months  Negative Consequences of Use: Work / Youth worker, Charity fundraiser relationships, Museum/gallery curator Name of Substance 1: Alcohol 1 - Age of First Use: childhod  1 - Amount (size/oz): 2-40oz 1 - Frequency: "as much as I can"  1 - Duration: 25 years 1 - Last Use / Amount: 04-05-15 BAL-367 Name of Substance 2: Heroin  2 - Age of First Use: 37 2 - Amount (size/oz): 1/2 gram  2 - Frequency: "whenever I can"  2 - Duration: ongoing  2 - Last Use / Amount: 03-30-15                Allergies:  No Known Allergies Lab Results:  Results for orders placed or performed during the hospital encounter of 04/06/15 (from the past 48 hour(s))  Urine rapid drug screen (hosp performed) (Not at [facility])     Status: None   Collection Time: 04/06/15  2:29 AM  Result Value Ref Range   Opiates NONE DETECTED NONE DETECTED   Cocaine NONE DETECTED NONE DETECTED   Benzodiazepines NONE DETECTED NONE DETECTED   Amphetamines NONE DETECTED NONE DETECTED   Tetrahydrocannabinol NONE DETECTED NONE DETECTED   Barbiturates NONE DETECTED NONE DETECTED    Comment:        DRUG SCREEN FOR MEDICAL PURPOSES ONLY.  IF CONFIRMATION IS NEEDED FOR ANY PURPOSE, NOTIFY LAB WITHIN 5 DAYS.        LOWEST DETECTABLE LIMITS FOR URINE DRUG SCREEN Drug Class       Cutoff (ng/mL) Amphetamine      1000 Barbiturate      200 Benzodiazepine   353 Tricyclics       299 Opiates          300 Cocaine           300 THC              50   Comprehensive metabolic panel     Status: Abnormal   Collection Time: 04/06/15  2:36 AM  Result Value Ref Range   Sodium 142 135 - 145 mmol/L   Potassium 3.7 3.5 - 5.1 mmol/L   Chloride 106 101 - 111 mmol/L   CO2 26 22 - 32 mmol/L   Glucose, Bld 106 (H) 65 - 99 mg/dL   BUN 5 (L) 6 - 20 mg/dL   Creatinine, Ser 0.71 0.61 - 1.24 mg/dL   Calcium 8.6 (L) 8.9 - 10.3 mg/dL   Total Protein 7.3 6.5 - 8.1 g/dL   Albumin 4.0 3.5 - 5.0 g/dL   AST 173 (H) 15 - 41 U/L   ALT 160 (H) 17 - 63 U/L   Alkaline Phosphatase 102 38 - 126 U/L  Total Bilirubin 0.3 0.3 - 1.2 mg/dL   GFR calc non Af Amer >60 >60 mL/min   GFR calc Af Amer >60 >60 mL/min    Comment: (NOTE) The eGFR has been calculated using the CKD EPI equation. This calculation has not been validated in all clinical situations. eGFR's persistently <60 mL/min signify possible Chronic Kidney Disease.    Anion gap 10 5 - 15  Ethanol (ETOH)     Status: Abnormal   Collection Time: 04/06/15  2:36 AM  Result Value Ref Range   Alcohol, Ethyl (B) 367 (HH) <5 mg/dL    Comment:        LOWEST DETECTABLE LIMIT FOR SERUM ALCOHOL IS 5 mg/dL FOR MEDICAL PURPOSES ONLY CRITICAL RESULT CALLED TO, READ BACK BY AND VERIFIED WITH: Jonne Ply, RN [phone] 04/06/15 BY A NORMAN   CBC     Status: None   Collection Time: 04/06/15  2:36 AM  Result Value Ref Range   WBC 6.6 4.0 - 10.5 K/uL   RBC 4.89 4.22 - 5.81 MIL/uL   Hemoglobin 14.6 13.0 - 17.0 g/dL   HCT 43.1 39.0 - 52.0 %   MCV 88.1 78.0 - 100.0 fL   MCH 29.9 26.0 - 34.0 pg   MCHC 33.9 30.0 - 36.0 g/dL   RDW 13.6 11.5 - 15.5 %   Platelets 308 150 - 400 K/uL    Blood Alcohol level:  Lab Results  Component Value Date   ETH 367* 04/06/2015   ETH 285* 96/04/5407    Metabolic Disorder Labs:  No results found for: HGBA1C, MPG No results found for: PROLACTIN No results found for: CHOL, TRIG, HDL, CHOLHDL, VLDL, LDLCALC  Current Medications: Current  Facility-Administered Medications  Medication Dose Route Frequency Provider Last Rate Last Dose  . lidocaine-EPINEPHrine (XYLOCAINE W/EPI) 2 %-1:200000 (PF) injection           . LORazepam (ATIVAN) tablet 0-4 mg  0-4 mg Oral 4 times per day Antonietta Breach, PA-C   1 mg at 04/06/15 1347   Followed by  . [START ON 04/08/2015] LORazepam (ATIVAN) tablet 0-4 mg  0-4 mg Oral Q12H Antonietta Breach, PA-C       Current Outpatient Prescriptions  Medication Sig Dispense Refill  . azithromycin (ZITHROMAX Z-PAK) 250 MG tablet 2 po day one, then 1 daily x 4 days (Patient not taking: Reported on 04/06/2015) 6 tablet 0  . chlordiazePOXIDE (LIBRIUM) 25 MG capsule 50m PO TID x 1D, then 25-582mPO BID X 1D, then 25-5032mO QD X 1D (Patient not taking: Reported on 04/01/2015) 10 capsule 0  . cyclobenzaprine (FLEXERIL) 10 MG tablet Take 1 tablet (10 mg total) by mouth 2 (two) times daily as needed for muscle spasms. (Patient not taking: Reported on 02/04/2015) 20 tablet 0  . meloxicam (MOBIC) 7.5 MG tablet Take 1 tablet (7.5 mg total) by mouth 2 (two) times daily. (Patient not taking: Reported on 02/04/2015) 60 tablet 0  . Multiple Vitamin (MULTIVITAMIN WITH MINERALS) TABS tablet Take 1 tablet by mouth daily. (Patient not taking: Reported on 02/04/2015)    . traZODone (DESYREL) 100 MG tablet Take 1 tablet (100 mg total) by mouth at bedtime and may repeat dose one time if needed. (Patient not taking: Reported on 02/04/2015) 60 tablet 0   PTA Medications:  (Not in a hospital admission)  Musculoskeletal: Strength & Muscle Tone: within normal limits Gait & Station: normal Patient leans: N/A  Psychiatric Specialty Exam: Physical Exam  ROS  Blood pressure 137/94, pulse  88, temperature 98.4 F (36.9 C), temperature source Oral, resp. rate 20, height 6' 1"  (1.854 m), weight 63.504 kg (140 lb), SpO2 100 %.Body mass index is 18.47 kg/(m^2).  General Appearance: Disheveled  Eye Sport and exercise psychologist::  Fair  Speech:  Clear and Coherent   Volume:  Normal  Mood:  Dysphoric  Affect:  Constricted  Thought Process:  Goal Directed and Intact  Orientation:  Full (Time, Place, and Person)  Thought Content:  Symptoms, worries   Suicidal Thoughts:  Yes.  with intent/plan  Homicidal Thoughts:  No  Memory:  Immediate;   Good Recent;   Good Remote;   Good  Judgement:  Fair  Insight:  Present  Psychomotor Activity:  Tremor  Concentration:  Good  Recall:  Good  Fund of Knowledge:Good  Language: Good  Akathisia:  No  Handed:  Right  AIMS (if indicated):     Assets:  Communication Skills Desire for Improvement Leisure Time Physical Health Resilience Social Support  ADL's:  Intact  Cognition: WNL  Sleep:        Treatment Plan Summary: Daily contact with patient to assess and evaluate symptoms and progress in treatment and Medication management   -Admit to the [place] Unit for further monitoring and evaluation.   Observation Level/Precautions:  Continuous Observation  Laboratory:  CBC Chemistry Profile UDS  Psychotherapy:  Individual   Medications:  Ativan to prevent seizures   Consultations:  None  Discharge Concerns:  Continued substance abuse   Estimated LOS: Less than 48 hours  Other:  Counselor to explore treatment options for substance abuse       Kasir Hallenbeck, Mickel Baas, NP 3/24/20172:53 PM
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
## 2015-04-06 NOTE — BH Assessment (Signed)
Assessment completed. Consulted Fran Hobson, NP who recommended inpatient treatment. TTS to seek placement. Informed Kelly Humes, PA-C of the recommendation.  

## 2015-04-06 NOTE — ED Notes (Signed)
[facility] Pt has a bed in the Observation Unit.  However, he cannot be transferred until after 7pm, due to lack of staffing.

## 2015-04-06 NOTE — ED Notes (Signed)
[facility] called for transportation.  

## 2015-04-06 NOTE — ED Notes (Signed)
PA at bedside.

## 2015-04-06 NOTE — ED Notes (Signed)
Patient currently sleeping.  NAD at this time.

## 2015-04-06 NOTE — H&P (Deleted)
  The note originally documented on this encounter has been moved the the encounter in which it belongs.  

## 2015-04-06 NOTE — ED Notes (Signed)
Pt transported to xray 

## 2015-04-06 NOTE — Discharge Instructions (Signed)

## 2015-04-06 NOTE — ED Notes (Signed)
[facility]  RN asked to hold patient for 45 minutes as they have admissions coming it at this time.  Will send patient at 0845.

## 2015-04-07 MED ORDER — LORAZEPAM 1 MG PO TABS
1.0000 mg | ORAL_TABLET | Freq: Three times a day (TID) | ORAL | Status: DC
  Administered 2015-04-08 (×2): 1 mg via ORAL
  Filled 2015-04-07 (×2): qty 1

## 2015-04-07 MED ORDER — LORAZEPAM 1 MG PO TABS: 1.0000 mg | ORAL_TABLET | Freq: Two times a day (BID) | ORAL | Status: DC

## 2015-04-07 MED ORDER — LORAZEPAM 1 MG PO TABS
1.0000 mg | ORAL_TABLET | Freq: Four times a day (QID) | ORAL | Status: AC
  Administered 2015-04-07 (×3): 1 mg via ORAL
  Filled 2015-04-07 (×3): qty 1

## 2015-04-07 MED ORDER — LORAZEPAM 1 MG PO TABS: 1.0000 mg | ORAL_TABLET | Freq: Every day | ORAL | Status: DC

## 2015-04-07 MED ORDER — NICOTINE 21 MG/24HR TD PT24
21.0000 mg | MEDICATED_PATCH | Freq: Every day | TRANSDERMAL | Status: DC
  Administered 2015-04-07 – 2015-04-08 (×2): 21 mg via TRANSDERMAL
  Filled 2015-04-07 (×2): qty 1

## 2015-04-07 NOTE — Progress Notes (Signed)
Nursing Shift Note:  Patient sleeping, rather difficult to awaken with verbal stimulation but once awakened patient is appropriate and cooperative. Patient endorses passive SI without a plan, denies any HI or aAVH and contracts for safety on the Unit meaning patient agrees to alert staff before acting out on any self harm thoughts should he have any. Nurse administering medications as prescirbed, providing emotional support, therapeutic communication, and ensuring constant observation of patient for safety except when patient in bathroom. Patient remains safe on Unit.
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
## 2015-04-07 NOTE — BH Assessment (Signed)
[facility] Assessment Progress Note  Patient was seen this date by Earlene Plateravis NP and Lowen Barringer LCAS. Patient stated he was still suffering from ETOH withdrawals and also could not contract for safety. Patient was observed to be sweating and displayed tremors. Patient will continue on CWAI protocol and be monitored in the Observation Unit per [facility]. Patient will be re-evaluated on 04/08/15.

## 2015-04-07 NOTE — Progress Notes (Signed)
D: Patient more pleasant tonight. Stated "I feel a lot better" complained of headache. Denies SI, AH/VH at this time. Watched TV and went to bed.  A: Staff encouraged patient to continue with the treatment plan and verbalize needs/concerns. Constant monitoring and observation maintained for safety and stability.  R: Patient remains safe and compliant with the treatment plan.

## 2015-04-07 NOTE — Progress Notes (Signed)
[facility] Observation Unit Progress Note  04/07/2015 10:38 AM Jake [name]  MRN:  [id number] Subjective:   Patient states "I'm not as sick as I was yesterday. But I don't feel that good. I was drinking very heavily before I came here. I feel pretty depressed. I think it's the alcohol making me feel that way. I am interested in my treatment options. I was at [facility] last year in March. I am still shaking and having sweats. I don't feel ready to go yet."  Objective:  Patient is seen and chart is reviewed. The patient is complaining of withdrawal symptoms from alcohol. He is reporting a history of DT's but appears to be stabilizing with the ativan taper. Patient is noted to be less tremulous than upon evaluation by this writer yesterday. He continues to report depressive symptoms and suicidal ideation. Patient declines to be started on an antidepressant expresses a preference to obtain treatment for his alcoholism then to revisit his mood at a later time. He is open to discussing his treatment options with assistance from the counselor. The patient also is having problems with housing at this time. His alcohol level was very high on admission at 367.   Principal Problem: Alcohol-induced mood disorder ([place]) Diagnosis:   Patient Active Problem List   Diagnosis Date Noted  . Alcohol-induced mood disorder ([place]) [F10.94] 04/06/2015  . Alcohol dependence with alcohol-induced mood disorder ([place]) [F10.24] 03/09/2014  . Polysubstance abuse [F19.10] 03/09/2014  . Opioid dependence ([place]) [F11.20] 03/09/2014  . Alcohol use disorder, severe, dependence ([place]) [F10.20] 03/08/2014  . Substance induced mood disorder ([place]) [F19.94] 03/08/2014  . Alcohol abuse [F10.10] 06/28/2013  . Alcohol withdrawal ([place]) [F10.239] 06/28/2013  . DTs (delirium tremens) ([place]) [F10.231] 06/28/2013  . Marijuana abuse [F12.10] 04/28/2011  . Alcoholism ([place]) [F10.20] 04/27/2011  . Toxic encephalopathy [G92] 04/27/2011  . Psychosis  [F29] 04/27/2011  . Leukocytosis [D72.829] 04/27/2011  . Hyponatremia [E87.1] 04/27/2011  . Thrombocytopenia ([place]) [D69.6] 04/27/2011  . Elevated AST (SGOT) [R74.0] 04/27/2011  . Elevated bilirubin [R17] 04/27/2011   Total Time spent with patient: 30 minutes  Past Psychiatric History: Substance induced mood disorder   Past Medical History:  Past Medical History  Diagnosis Date  . ETOH abuse   . Seizures ([place])   . Hip dislocation   . Depression   . Anxiety    History reviewed. No pertinent past surgical history. Family History: History reviewed. No pertinent family history. Social History:  History  Alcohol Use  . Yes     History  Drug Use  . Yes  . Special: Marijuana, Cocaine    Comment: herion, narcotics, benzo's, stimulants    Social History   Social History  . Marital Status: Single    Spouse Name: N/A  . Number of Children: N/A  . Years of Education: N/A   Social History Main Topics  . Smoking status: Current Every Day Smoker -- 2.00 packs/day    Types: Cigarettes  . Smokeless tobacco: Never Used  . Alcohol Use: Yes  . Drug Use: Yes    Special: Marijuana, Cocaine     Comment: herion, narcotics, benzo's, stimulants  . Sexual Activity: Not Asked   Other Topics Concern  . None   Social History Narrative   Additional Social History:                         Sleep: Fair  Appetite:  Fair  Current Medications: Current Facility-Administered Medications  Medication  Dose Route Frequency Provider Last Rate Last Dose  . acetaminophen (TYLENOL) tablet 650 mg  650 mg Oral Q6H PRN Patrecia Pour, NP      . alum & mag hydroxide-simeth (MAALOX/MYLANTA) 200-200-20 MG/5ML suspension 30 mL  30 mL Oral Q4H PRN Patrecia Pour, NP      . LORazepam (ATIVAN) tablet 1 mg  1 mg Oral QID Hampton Abbot, MD       Followed by  . [START ON 04/08/2015] LORazepam (ATIVAN) tablet 1 mg  1 mg Oral TID Hampton Abbot, MD       Followed by  . [START ON 04/09/2015] LORazepam  (ATIVAN) tablet 1 mg  1 mg Oral BID Hampton Abbot, MD       Followed by  . [START ON 04/10/2015] LORazepam (ATIVAN) tablet 1 mg  1 mg Oral Daily Hampton Abbot, MD      . magnesium hydroxide (MILK OF MAGNESIA) suspension 30 mL  30 mL Oral Daily PRN Patrecia Pour, NP        Lab Results:  Results for orders placed or performed during the hospital encounter of 04/06/15 (from the past 48 hour(s))  Urine rapid drug screen (hosp performed) (Not at [facility])     Status: None   Collection Time: 04/06/15  2:29 AM  Result Value Ref Range   Opiates NONE DETECTED NONE DETECTED   Cocaine NONE DETECTED NONE DETECTED   Benzodiazepines NONE DETECTED NONE DETECTED   Amphetamines NONE DETECTED NONE DETECTED   Tetrahydrocannabinol NONE DETECTED NONE DETECTED   Barbiturates NONE DETECTED NONE DETECTED    Comment:        DRUG SCREEN FOR MEDICAL PURPOSES ONLY.  IF CONFIRMATION IS NEEDED FOR ANY PURPOSE, NOTIFY LAB WITHIN 5 DAYS.        LOWEST DETECTABLE LIMITS FOR URINE DRUG SCREEN Drug Class       Cutoff (ng/mL) Amphetamine      1000 Barbiturate      200 Benzodiazepine   893 Tricyclics       810 Opiates          300 Cocaine          300 THC              50   Comprehensive metabolic panel     Status: Abnormal   Collection Time: 04/06/15  2:36 AM  Result Value Ref Range   Sodium 142 135 - 145 mmol/L   Potassium 3.7 3.5 - 5.1 mmol/L   Chloride 106 101 - 111 mmol/L   CO2 26 22 - 32 mmol/L   Glucose, Bld 106 (H) 65 - 99 mg/dL   BUN 5 (L) 6 - 20 mg/dL   Creatinine, Ser 0.71 0.61 - 1.24 mg/dL   Calcium 8.6 (L) 8.9 - 10.3 mg/dL   Total Protein 7.3 6.5 - 8.1 g/dL   Albumin 4.0 3.5 - 5.0 g/dL   AST 173 (H) 15 - 41 U/L   ALT 160 (H) 17 - 63 U/L   Alkaline Phosphatase 102 38 - 126 U/L   Total Bilirubin 0.3 0.3 - 1.2 mg/dL   GFR calc non Af Amer >60 >60 mL/min   GFR calc Af Amer >60 >60 mL/min    Comment: (NOTE) The eGFR has been calculated using the CKD EPI equation. This calculation has not been  validated in all clinical situations. eGFR's persistently <60 mL/min signify possible Chronic Kidney Disease.    Anion gap 10 5 - 15  Ethanol (ETOH)  Status: Abnormal   Collection Time: 04/06/15  2:36 AM  Result Value Ref Range   Alcohol, Ethyl (B) 367 (HH) <5 mg/dL    Comment:        LOWEST DETECTABLE LIMIT FOR SERUM ALCOHOL IS 5 mg/dL FOR MEDICAL PURPOSES ONLY CRITICAL RESULT CALLED TO, READ BACK BY AND VERIFIED WITH: Jonne Ply, RN [phone] 04/06/15 BY A NORMAN   CBC     Status: None   Collection Time: 04/06/15  2:36 AM  Result Value Ref Range   WBC 6.6 4.0 - 10.5 K/uL   RBC 4.89 4.22 - 5.81 MIL/uL   Hemoglobin 14.6 13.0 - 17.0 g/dL   HCT 43.1 39.0 - 52.0 %   MCV 88.1 78.0 - 100.0 fL   MCH 29.9 26.0 - 34.0 pg   MCHC 33.9 30.0 - 36.0 g/dL   RDW 13.6 11.5 - 15.5 %   Platelets 308 150 - 400 K/uL    Blood Alcohol level:  Lab Results  Component Value Date   ETH 367* 04/06/2015   ETH 285* 03/07/2014    Physical Findings: AIMS:  , ,  ,  ,    CIWA:  CIWA-Ar Total: 6 COWS:     Musculoskeletal: Strength & Muscle Tone: within normal limits Gait & Station: normal Patient leans: N/A  Psychiatric Specialty Exam: Review of Systems  Constitutional: Positive for chills and diaphoresis.  Neurological: Positive for tremors.  Psychiatric/Behavioral: Positive for depression, suicidal ideas and substance abuse. Negative for hallucinations and memory loss. The patient is nervous/anxious. The patient does not have insomnia.     Blood pressure 143/91, pulse 70, temperature 98.1 F (36.7 C), temperature source Oral, resp. rate 17, height 5' 6"  (1.676 m), weight 63.504 kg (140 lb), SpO2 96 %.Body mass index is 22.61 kg/(m^2).  General Appearance: Disheveled  Eye Contact::  Good  Speech:  Clear and Coherent  Volume:  Normal  Mood:  Dysphoric  Affect:  Congruent  Thought Process:  Goal Directed and Intact  Orientation:  Full (Time, Place, and Person)  Thought Content:   Symptoms, worries   Suicidal Thoughts:  Yes.  with intent/plan  Homicidal Thoughts:  No  Memory:  Immediate;   Good Recent;   Good Remote;   Good  Judgement:  Impaired  Insight:  Shallow  Psychomotor Activity:  Tremor  Concentration:  Fair  Recall:  Good  Fund of Knowledge:Good  Language: Good  Akathisia:  No  Handed:  Right  AIMS (if indicated):     Assets:  Communication Skills Desire for Improvement Leisure Time Resilience Vocational/Educational  ADL's:  Intact  Cognition: WNL  Sleep:      Treatment Plan Summary: Daily contact with patient to assess and evaluate symptoms and progress in treatment and Medication management  -Continue Ativan taper to manage withdrawal symptoms from chronic alcohol use -Reevaluate tomorrow for possible discharge   DAVIS, LAURA, NP 04/07/2015, 10:38 AM

## 2015-04-08 MED ORDER — ADULT MULTIVITAMIN W/MINERALS CH: 1.0000 | ORAL_TABLET | Freq: Every day | ORAL | Status: DC

## 2015-04-08 MED ORDER — MELOXICAM 7.5 MG PO TABS: 7.5000 mg | ORAL_TABLET | Freq: Two times a day (BID) | ORAL | Status: DC

## 2015-04-08 MED ORDER — TRAZODONE HCL 100 MG PO TABS: 100.0000 mg | ORAL_TABLET | Freq: Every evening | ORAL | Status: DC | PRN

## 2015-04-08 NOTE — Discharge Instructions (Signed)
[facility] are [facility] and provided services. You are also [facility] and for substance abuse treatment.

## 2015-04-08 NOTE — Progress Notes (Signed)
NSG 7a-7p shift:   D:  Pt. Is cooperative at this time but reports withdrawal symptoms (tremors, diaphoresis), but no tremors are visible when pt is not aware that he is being observed and patient does not present with any obvious perspiration at this time.   A: Support, education, and encouragement provided as needed.  Observation continued for safety.  R: Pt. moderately receptive to intervention/s.  Safety maintained.  Joaquin MusicMary Kymari Lollis, RN

## 2015-04-08 NOTE — Discharge Summary (Signed)
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 Alcohol-induced mood disorder (HCC) [F10.94] 04/06/2015  . 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 Polysubstance abuse [F19.10] 03/09/2014  . Opioid dependence (HCC) [F11.20] 03/09/2014  . Alcohol use disorder, severe, dependence (HCC) [F10.20] 03/08/2014  . Substance induced mood disorder (HCC) [F19.94] 03/08/2014  . Alcohol abuse [F10.10] 06/28/2013  . Alcohol withdrawal (HCC) [F10.239] 06/28/2013  . DTs (delirium tremens) (HCC) [F10.231] 06/28/2013  . Marijuana abuse [F12.10] 04/28/2011  . Alcoholism (HCC) [F10.20] 04/27/2011  . Toxic encephalopathy [G92] 04/27/2011  . Psychosis [F29] 04/27/2011  . Leukocytosis [D72.829] 04/27/2011  . Hyponatremia [E87.1] 04/27/2011  . Thrombocytopenia (HCC) [D69.6] 04/27/2011  . Elevated AST (SGOT) [R74.0] 04/27/2011  . Elevated bilirubin [R17] 04/27/2011    Past Psychiatric History: See H & P  Past Medical History:  Past Medical History  Diagnosis Date  . ETOH abuse   . Seizures (HCC)   . Hip dislocation   . Depression   . Anxiety    History reviewed. No pertinent past surgical history. Family History: History reviewed. No pertinent family history. Family Psychiatric  History: Father with alcohol abuse  Social History:  History  Alcohol Use  . Yes     History  Drug Use  . Yes  . Special: Marijuana, Cocaine    Comment: herion, narcotics, benzo's, stimulants    Social History   Social History  .  Marital Status: Single    Spouse Name: N/A  . Number of Children: N/A  . Years of Education: N/A   Social History Main Topics  . Smoking status: Current Every Day Smoker -- 2.00 packs/day    Types: Cigarettes  . Smokeless tobacco: Never Used  . Alcohol Use: Yes  . Drug Use: Yes    Special: Marijuana, Cocaine     Comment: herion, narcotics, benzo's, stimulants  . Sexual Activity: Not Asked   Other Topics Concern  . None   Social History Narrative    Hospital Course:    Jake [name] is an 45 y.o. male presenting to [facility] reporting suicidal ideations with a plan to jump in front of the [employer] train. He reports severe depressive symptoms that appear to be related to persistent alcohol abuse. Patient states "I have been drinking beer or liquor for at least a year. I left [facility] last year and went to [facility]. I was sober for about a month. I feel very depressed. I don't have a steady job and now I am living in a tent. I have no stability. I have been drinking a whole lot. I am very shaky, sweaty, and having tremors right now." The patient continues to endorse symptoms of depression and of acute alcohol withdrawal. His alcohol level on arrival to the ED was 367. Patient is currently on an ativan taper and his vitals are stable. He reports a history of seizures but currently appears stable while receiving medical treatment with the ativan taper. Patient would like to explore what treatment options are available to him. His liver enzymes are elevated  which suggest chronic alcohol abuse.   Patient was admitted to the [facility] Unit for further evaluation and management of acute alcohol withdrawal. He was started on the ativan detox protocol. His withdrawal symptoms gradually improved during his stay in the Observation Unit. Patient declined to be started on an antidepressant as he felt his depressed mood was related to the effects of alcohol. He also requested help with housing due to being  currently homeless. Patient denied any further suicidal ideation and appeared to be more future oriented than on admission. He was referred to the [facility] for housing issues and for ADS to address his alcohol abuse after discharge. Patient was discharged in stable condition with all belongings returned to him. He was encouraged to follow with his Primary Care Provider in seven to ten days for removal of stitches to the left side of my face and to follow up for elevated liver enzymes.   Physical Findings: AIMS:  , ,  ,  ,    CIWA:  CIWA-Ar Total: 1 COWS:     Musculoskeletal: Strength & Muscle Tone: within normal limits Gait & Station: normal Patient leans: N/A  Psychiatric Specialty Exam: Review of Systems  Constitutional: Negative.   HENT: Negative.   Eyes: Negative.   Respiratory: Negative.   Cardiovascular: Negative.   Gastrointestinal: Negative.   Genitourinary: Negative.   Musculoskeletal: Negative.   Skin: Negative.   Neurological: Negative.   Endo/Heme/Allergies: Negative.   Psychiatric/Behavioral: Positive for substance abuse. Negative for depression, suicidal ideas, hallucinations and memory loss. The patient is not nervous/anxious and does not have insomnia.     Blood pressure 109/87, pulse 97, temperature 98.3 F (36.8 C), temperature source Oral, resp. rate 15, height  (1.676 m), weight 63.504 kg (140 lb), SpO2 98 %.Body mass index is 22.61 kg/(m^2).  General Appearance: Casual  Eye Contact::  Good  Speech:  Clear and Coherent  Volume:  Normal  Mood:  Depressed  Affect:  Appropriate  Thought Process:  Goal Directed and Intact  Orientation:  Full (Time, Place, and Person)  Thought Content:  Symptoms, worries   Suicidal Thoughts:  No  Homicidal Thoughts:  No  Memory:  Immediate;   Good Recent;   Good Remote;   Good  Judgement:  Impaired  Insight:  Shallow  Psychomotor Activity:  Normal  Concentration:  Good  Recall:  Good  Fund of Knowledge:Good  Language:  Good  Akathisia:  No  Handed:  Right  AIMS (if indicated):     Assets:  Communication Skills Desire for Improvement Leisure Time Physical Health Resilience Social Support  ADL's:  Intact  Cognition: WNL  Sleep:      Have you used any form of tobacco in the last 30 days? (Cigarettes, Smokeless Tobacco, Cigars, and/or Pipes): Yes  Has this patient used any form of tobacco in the last 30 days? (Cigarettes, Smokeless Tobacco, Cigars, and/or Pipes) Yes, Yes, A prescription for an FDA-approved tobacco cessation medication was offered at discharge and the patient refused  Blood Alcohol level:  Lab Results  Component Value Date   ETH 367* 04/06/2015   ETH 285* 03/07/2014    Metabolic Disorder Labs:  No results found for: HGBA1C, MPG No results found for: PROLACTIN No results found for: CHOL, TRIG, HDL, CHOLHDL, VLDL, LDLCALC  Discharge destination:  Home  Is patient on multiple antipsychotic therapies at discharge:  No   Has Patient had three or more failed trials of antipsychotic monotherapy by history:  No  Recommended  Plan for Multiple Antipsychotic Therapies: NA      Discharge Instructions    Discharge instructions    Complete by:  As directed   Please follow up with your Primary Care Provider in seven-ten days for removal of stitches to the left side of your face that were placed in the ED. Please return to the ED earlier if signs of infection are present.            Medication List    STOP taking these medications        cyclobenzaprine 10 MG tablet  Commonly known as:  FLEXERIL      TAKE these medications      Indication   meloxicam 7.5 MG tablet  Commonly known as:  MOBIC  Take 1 tablet (7.5 mg total) by mouth 2 (two) times daily.   Indication:  Joint Damage causing Pain and Loss of Function     multivitamin with minerals Tabs tablet  Take 1 tablet by mouth daily.   Indication:  Vitamin Supplementation     traZODone 100 MG tablet  Commonly known as:   DESYREL  Take 1 tablet (100 mg total) by mouth at bedtime and may repeat dose one time if needed.   Indication:  Trouble Sleeping       Follow-up Information    Follow up with [facility]. Go today.   Why:  shelter and provided services for your immediate needs.   Contact information:   [address] [place], [place] [address] [phone]      Follow up with [facility]. Go in 1 day.   Specialty:  Behavioral Health   Why:  continuation of care and rehabilitation services/ treatment.   Contact information:   [address] [place] [address] [phone]       Follow-up recommendations:    As above   Comments:   Take all your medications as prescribed by your mental healthcare provider.  Report any adverse effects and or reactions from your medicines to your outpatient provider promptly.  Patient is instructed and cautioned to not engage in alcohol and or illegal drug use while on prescription medicines.  In the event of worsening symptoms, patient is instructed to call the crisis hotline, 911 and or go to the nearest ED for appropriate evaluation and treatment of symptoms.  Follow-up with your primary care provider for your other medical issues, concerns and or health care needs.   SignedFransisca Kaufmann, NP 04/08/2015, 1:26 PM  I agree with plan

## 2015-04-09 DIAGNOSIS — F332 Major depressive disorder, recurrent severe without psychotic features: Secondary | ICD-10-CM | POA: Insufficient documentation

## 2015-04-09 DIAGNOSIS — F419 Anxiety disorder, unspecified: Secondary | ICD-10-CM | POA: Insufficient documentation

## 2015-04-09 DIAGNOSIS — F1210 Cannabis abuse, uncomplicated: Secondary | ICD-10-CM | POA: Insufficient documentation

## 2015-04-09 DIAGNOSIS — F17210 Nicotine dependence, cigarettes, uncomplicated: Secondary | ICD-10-CM | POA: Insufficient documentation

## 2015-04-09 DIAGNOSIS — Z87828 Personal history of other (healed) physical injury and trauma: Secondary | ICD-10-CM | POA: Insufficient documentation

## 2015-04-10 ENCOUNTER — Emergency Department (HOSPITAL_COMMUNITY)
Admission: EM | Admit: 2015-04-10 | Discharge: 2015-04-10 | Disposition: A | Discharge status: Other Acute Inpatient Hospital | Payer: Self-pay | Attending: Emergency Medicine | Admitting: Emergency Medicine

## 2015-04-10 ENCOUNTER — Encounter (HOSPITAL_COMMUNITY): Payer: Self-pay | Admitting: Emergency Medicine

## 2015-04-10 ENCOUNTER — Inpatient Hospital Stay (HOSPITAL_COMMUNITY)
Admission: AD | Admit: 2015-04-10 | Discharge: 2015-04-14 | DRG: 885 | Disposition: A | Discharge status: Home / Self Care | Payer: Federal, State, Local not specified - Other | Source: Intra-hospital | Attending: Psychiatry | Admitting: Psychiatry

## 2015-04-10 ENCOUNTER — Encounter (HOSPITAL_COMMUNITY): Payer: Self-pay | Admitting: *Deleted

## 2015-04-10 DIAGNOSIS — G4700 Insomnia, unspecified: Secondary | ICD-10-CM | POA: Diagnosis present

## 2015-04-10 DIAGNOSIS — Y908 Blood alcohol level of 240 mg/100 ml or more: Secondary | ICD-10-CM | POA: Diagnosis present

## 2015-04-10 DIAGNOSIS — F39 Unspecified mood [affective] disorder: Secondary | ICD-10-CM | POA: Diagnosis present

## 2015-04-10 DIAGNOSIS — R45851 Suicidal ideations: Secondary | ICD-10-CM | POA: Diagnosis present

## 2015-04-10 DIAGNOSIS — F1020 Alcohol dependence, uncomplicated: Secondary | ICD-10-CM | POA: Diagnosis present

## 2015-04-10 DIAGNOSIS — Z590 Homelessness: Secondary | ICD-10-CM

## 2015-04-10 DIAGNOSIS — F332 Major depressive disorder, recurrent severe without psychotic features: Secondary | ICD-10-CM | POA: Diagnosis present

## 2015-04-10 DIAGNOSIS — F17210 Nicotine dependence, cigarettes, uncomplicated: Secondary | ICD-10-CM | POA: Diagnosis present

## 2015-04-10 DIAGNOSIS — Z811 Family history of alcohol abuse and dependence: Secondary | ICD-10-CM | POA: Diagnosis not present

## 2015-04-10 DIAGNOSIS — F1094 Alcohol use, unspecified with alcohol-induced mood disorder: Secondary | ICD-10-CM | POA: Diagnosis present

## 2015-04-10 LAB — COMPREHENSIVE METABOLIC PANEL
ALBUMIN: 4.2 g/dL (ref 3.5–5.0)
ALT: 252 U/L — ABNORMAL HIGH (ref 17–63)
ANION GAP: 11 (ref 5–15)
AST: 253 U/L — AB (ref 15–41)
Alkaline Phosphatase: 96 U/L (ref 38–126)
BUN: 15 mg/dL (ref 6–20)
CO2: 23 mmol/L (ref 22–32)
Calcium: 8.8 mg/dL — ABNORMAL LOW (ref 8.9–10.3)
Chloride: 106 mmol/L (ref 101–111)
Creatinine, Ser: 0.97 mg/dL (ref 0.61–1.24)
GFR calc Af Amer: >60 mL/min (ref >60)
GFR calc non Af Amer: >60 mL/min (ref >60)
GLUCOSE: 98 mg/dL (ref 65–99)
POTASSIUM: 4 mmol/L (ref 3.5–5.1)
SODIUM: 140 mmol/L (ref 135–145)
Total Bilirubin: 0.7 mg/dL (ref 0.3–1.2)
Total Protein: 7.6 g/dL (ref 6.5–8.1)

## 2015-04-10 LAB — CBC
HEMATOCRIT: 43.2 % (ref 39.0–52.0)
HEMOGLOBIN: 14.6 g/dL (ref 13.0–17.0)
MCH: 30 pg (ref 26.0–34.0)
MCHC: 33.8 g/dL (ref 30.0–36.0)
MCV: 88.7 fL (ref 78.0–100.0)
Platelets: 238 10*3/uL (ref 150–400)
RBC: 4.87 MIL/uL (ref 4.22–5.81)
RDW: 13.3 % (ref 11.5–15.5)
WBC: 12.8 10*3/uL — AB (ref 4.0–10.5)

## 2015-04-10 LAB — RAPID URINE DRUG SCREEN, HOSP PERFORMED
AMPHETAMINES: NOT DETECTED
BARBITURATES: NOT DETECTED
BENZODIAZEPINES: NOT DETECTED
COCAINE: NOT DETECTED
Opiates: NOT DETECTED
TETRAHYDROCANNABINOL: POSITIVE — AB

## 2015-04-10 LAB — SALICYLATE LEVEL: Salicylate Lvl: <4 mg/dL (ref 2.8–30.0)

## 2015-04-10 LAB — ETHANOL: Alcohol, Ethyl (B): 265 mg/dL — ABNORMAL HIGH (ref <5)

## 2015-04-10 LAB — ACETAMINOPHEN LEVEL

## 2015-04-10 MED ORDER — LORAZEPAM 1 MG PO TABS
1.0000 mg | ORAL_TABLET | Freq: Two times a day (BID) | ORAL | Status: AC
  Administered 2015-04-12 (×2): 1 mg via ORAL
  Filled 2015-04-10 (×2): qty 1

## 2015-04-10 MED ORDER — LORAZEPAM 1 MG PO TABS
1.0000 mg | ORAL_TABLET | Freq: Every day | ORAL | Status: AC
  Administered 2015-04-13: 1 mg via ORAL
  Filled 2015-04-10: qty 1

## 2015-04-10 MED ORDER — TRAZODONE HCL 100 MG PO TABS
100.0000 mg | ORAL_TABLET | Freq: Every evening | ORAL | Status: DC | PRN
  Administered 2015-04-10 – 2015-04-13 (×4): 100 mg via ORAL
  Filled 2015-04-10 (×2): qty 1
  Filled 2015-04-10: qty 14
  Filled 2015-04-10 (×8): qty 1

## 2015-04-10 MED ORDER — VITAMIN B-1 100 MG PO TABS
100.0000 mg | ORAL_TABLET | Freq: Every day | ORAL | Status: DC
  Administered 2015-04-10 – 2015-04-13 (×4): 100 mg via ORAL
  Filled 2015-04-10 (×6): qty 1

## 2015-04-10 MED ORDER — LORAZEPAM 1 MG PO TABS: 0.0000 mg | ORAL_TABLET | Freq: Two times a day (BID) | ORAL | Status: DC

## 2015-04-10 MED ORDER — LORAZEPAM 1 MG PO TABS
1.0000 mg | ORAL_TABLET | Freq: Four times a day (QID) | ORAL | Status: AC
  Administered 2015-04-10 (×3): 1 mg via ORAL
  Filled 2015-04-10 (×3): qty 1

## 2015-04-10 MED ORDER — ZOLPIDEM TARTRATE 5 MG PO TABS: 5.0000 mg | ORAL_TABLET | Freq: Every evening | ORAL | Status: DC | PRN

## 2015-04-10 MED ORDER — NICOTINE 21 MG/24HR TD PT24
21.0000 mg | MEDICATED_PATCH | Freq: Every day | TRANSDERMAL | Status: DC
  Administered 2015-04-10 – 2015-04-13 (×4): 21 mg via TRANSDERMAL
  Filled 2015-04-10 (×7): qty 1

## 2015-04-10 MED ORDER — ALUM & MAG HYDROXIDE-SIMETH 200-200-20 MG/5ML PO SUSP: 30.0000 mL | ORAL | Status: DC | PRN

## 2015-04-10 MED ORDER — ONDANSETRON HCL 4 MG PO TABS: 4.0000 mg | ORAL_TABLET | Freq: Three times a day (TID) | ORAL | Status: DC | PRN

## 2015-04-10 MED ORDER — MAGNESIUM HYDROXIDE 400 MG/5ML PO SUSP: 30.0000 mL | Freq: Every day | ORAL | Status: DC | PRN

## 2015-04-10 MED ORDER — LORAZEPAM 1 MG PO TABS: 0.0000 mg | ORAL_TABLET | Freq: Four times a day (QID) | ORAL | Status: DC

## 2015-04-10 MED ORDER — LORAZEPAM 1 MG PO TABS
1.0000 mg | ORAL_TABLET | Freq: Three times a day (TID) | ORAL | Status: AC
  Administered 2015-04-11 (×3): 1 mg via ORAL
  Filled 2015-04-10 (×3): qty 1

## 2015-04-10 MED ORDER — IBUPROFEN 200 MG PO TABS: 600.0000 mg | ORAL_TABLET | Freq: Three times a day (TID) | ORAL | Status: DC | PRN

## 2015-04-10 MED ORDER — MELOXICAM 7.5 MG PO TABS
7.5000 mg | ORAL_TABLET | Freq: Two times a day (BID) | ORAL | Status: DC
  Administered 2015-04-10: 7.5 mg via ORAL
  Filled 2015-04-10 (×4): qty 1

## 2015-04-10 MED ORDER — NICOTINE 21 MG/24HR TD PT24: 21.0000 mg | MEDICATED_PATCH | Freq: Every day | TRANSDERMAL | Status: DC

## 2015-04-10 MED ORDER — MELOXICAM 7.5 MG PO TABS
7.5000 mg | ORAL_TABLET | Freq: Two times a day (BID) | ORAL | Status: DC
  Administered 2015-04-10 – 2015-04-13 (×7): 7.5 mg via ORAL
  Filled 2015-04-10 (×10): qty 1

## 2015-04-10 MED ORDER — IBUPROFEN 600 MG PO TABS: 600.0000 mg | ORAL_TABLET | Freq: Three times a day (TID) | ORAL | Status: DC | PRN

## 2015-04-10 MED ORDER — LOPERAMIDE HCL 2 MG PO CAPS
2.0000 mg | ORAL_CAPSULE | ORAL | Status: AC | PRN
Start: 2015-04-10 — End: 2015-04-13

## 2015-04-10 MED ORDER — TRAZODONE HCL 100 MG PO TABS: 100.0000 mg | ORAL_TABLET | Freq: Every evening | ORAL | Status: DC | PRN

## 2015-04-10 MED ORDER — ADULT MULTIVITAMIN W/MINERALS CH
1.0000 | ORAL_TABLET | Freq: Every day | ORAL | Status: DC
  Administered 2015-04-10 – 2015-04-13 (×4): 1 via ORAL
  Filled 2015-04-10 (×5): qty 1
  Filled 2015-04-10: qty 7
  Filled 2015-04-10: qty 1

## 2015-04-10 MED ORDER — ZOLPIDEM TARTRATE 10 MG PO TABS: 10.0000 mg | ORAL_TABLET | Freq: Every evening | ORAL | Status: DC | PRN

## 2015-04-10 MED ORDER — LORAZEPAM 1 MG PO TABS
0.0000 mg | ORAL_TABLET | Freq: Four times a day (QID) | ORAL | Status: DC
  Administered 2015-04-10: 1 mg via ORAL
  Filled 2015-04-10: qty 1

## 2015-04-10 MED ORDER — ACETAMINOPHEN 325 MG PO TABS: 650.0000 mg | ORAL_TABLET | Freq: Four times a day (QID) | ORAL | Status: DC | PRN

## 2015-04-10 MED ORDER — HYDROXYZINE HCL 25 MG PO TABS: 25.0000 mg | ORAL_TABLET | Freq: Four times a day (QID) | ORAL | Status: DC | PRN

## 2015-04-10 MED ORDER — ONDANSETRON 4 MG PO TBDP: 4.0000 mg | ORAL_TABLET | Freq: Four times a day (QID) | ORAL | Status: AC | PRN

## 2015-04-10 NOTE — ED Notes (Signed)
Patient presents for SI with plan to shoot self, reports assess to gun. Endorses ETOH, marijuana, and HA.

## 2015-04-10 NOTE — Progress Notes (Signed)
Pt did not attend AA meeting this evening.  

## 2015-04-10 NOTE — [facility] Group Notes (Signed)
[facility] LCSW Group Therapy  04/10/2015 1:33 PM  Type of Therapy:  Group Therapy  Participation Level:  Did Not Attend-pt recently arrived on unit. Chose to remain in bed.  Summary of Progress/Problems: [facility] Speaker came to talk about his personal journey with substance abuse and addiction.   Smart, Keghan Mcfarren LCSW 04/10/2015, 1:33 PM
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
## 2015-04-10 NOTE — [facility] Assessment (Signed)
[facility] Assessment Progress Note  Per morning shift report, [facility] Donell SievertSpencer Simon, PA.  Clint Bolderori Beck, RN, [facility], has assigned pt to Rm 307-1, but pt cannot be transferred until morning.  At 10:00 Berneice Heinrichina Tate, RN, [facility] reports that pt can be transferred at 11:00.  Pt has signed Voluntary Admission and Consent for Treatment, as well as Consent to Release Information to no one, and signed forms [facility].  Pt's nurse, Kendal Hymendie, has been notified, and agrees to send original paperwork along with pt via Juel [facility], and to call report to [phone].  Doylene Canninghomas Ebone Alcivar, MA Triage Specialist [phone]

## 2015-04-10 NOTE — ED Notes (Signed)
Patient received cooperative and calm, soft spoken. Patient endorses recent SI, but states that it comes and goes. Patient denies HI, A/ V H, current pain. Patient contracts for safety and denies taking any home medications. Patient reports that his seizure history is related to detox from alcohol. Patient denies any s/s of detox now and went to bed.  Q 15 min monitoring for safety.
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
## 2015-04-10 NOTE — Tx Team (Signed)
Initial Interdisciplinary Treatment Plan   PATIENT STRESSORS: Financial difficulties Health problems Substance abuse   PATIENT STRENGTHS: Ability for insight Average or above average intelligence Motivation for treatment/growth Work skills   PROBLEM LIST: Problem List/Patient Goals Date to be addressed Date deferred Reason deferred Estimated date of resolution  Substance Abuse 04/10/2015  04/10/2015   D/C  At risk for suicide 04/10/2015  04/10/2015   D/C  "alcoholism" 04/10/2015  04/10/2015   D/C  "Trying to get back on my feet and find a stable home" 04/10/2015  04/10/2015   D/C                                 DISCHARGE CRITERIA:  Ability to meet basic life and health needs Adequate post-discharge living arrangements Improved stabilization in mood, thinking, and/or behavior Motivation to continue treatment in a less acute level of care Need for constant or close observation no longer present Reduction of life-threatening or endangering symptoms to within safe limits Withdrawal symptoms are absent or subacute and managed without 24-hour nursing intervention  PRELIMINARY DISCHARGE PLAN: Attend 12-step recovery group Outpatient therapy Placement in alternative living arrangements  PATIENT/FAMIILY INVOLVEMENT: This treatment plan has been presented to and reviewed with the patient, Jake [name].  The patient and family have been given the opportunity to ask questions and make suggestions.  Larry SierrasMiddleton, Meosha Castanon P 04/10/2015, 12:24 PM

## 2015-04-10 NOTE — Progress Notes (Signed)
Recreation Therapy Notes  Animal-Assisted Activity (AAA) Program Checklist/Progress Notes Patient Eligibility Criteria Checklist & Daily Group note for Rec Tx Intervention  Date: 03.28.2017 Time: 2:45pm Location: [address] Dayroom   AAA/T Program Assumption of Risk Form signed by Patient/ or Parent Legal Guardian Yes.    Patient is free of allergies or sever asthma Yes.    Patient reports no fear of animals Yes.    Patient reports no history of cruelty to animals Yes.    Patient understands his/her participation is voluntary Yes.    Behavioral Response: Did not attend.    Irish Piech L Mauri Tolen, LRT/CTRS        Daymein Nunnery L 04/10/2015 3:16 PM 

## 2015-04-10 NOTE — ED Notes (Signed)
Pt alert and oriented.  Pt contracts for safety.  Pt is shaky but denies nausea and vomiting.  Denies HI and AVH.  Pt discharged ambulatory with [place] driver.  All belongings were returned to pt.

## 2015-04-10 NOTE — [facility] Counselor (Signed)
Psych disposition: Per Donell SievertSpencer Simon, PA-C, Pt meets inpatient treatment criteria. Per Clint Bolderori Beck, [facility], [facility] care of Dr Dub MikesLugo.   [facility] day shift AC will call [facility] and let RN's know when bed is ready and [facility]. Voluntary support paperwork will need to be completed this morning prior to transfer.    - Cyndie MullAnna Echo Propp, [facility]   [facility]

## 2015-04-11 LAB — HIV ANTIBODY (ROUTINE TESTING W REFLEX): HIV SCREEN 4TH GENERATION: NONREACTIVE

## 2015-04-11 MED ORDER — ENSURE ENLIVE PO LIQD
237.0000 mL | Freq: Two times a day (BID) | ORAL | Status: DC
  Administered 2015-04-11 – 2015-04-13 (×2): 237 mL via ORAL

## 2015-04-11 NOTE — [facility] Group Notes (Signed)
[facility] LCSW Group Therapy  04/11/2015 3:32 PM  Type of Therapy:  Group Therapy  Participation Level:  Active  Participation Quality:  Attentive  Affect:  Appropriate  Cognitive:  Alert and Oriented  Insight:  Engaged  Engagement in Therapy:  Improving  Modes of Intervention:  Confrontation, Discussion, Education, Exploration, Problem-solving, Rapport Building, Socialization and Support  Summary of Progress/Problems: Self Sabotage and and Enabling Behaviors. Group members were asked to define these terms and identify instances of self sabotage in their experiences with substance abuse/relapse. Patients discussed how these examples of self sabotoge impacted their sobriety and how to avoid self sabotoge in the future. Group members were asked to identify a time when they were enabled or enabled someone else. Patients were encouraged to explore unhealthy relationships in their lives and ways to stop enabling behaviors. [name] was attentive and engaged during today's processing group. He shared that he tends to self sabotage by comparing himself to others. "It's hard to see other people in recovery doing well when [place]." [name] was receptive to learning about ways to improve his social support network.   Smart, Mariyam Remington LCSW 04/11/2015, 3:32 PM

## 2015-04-11 NOTE — Progress Notes (Signed)
Recreation Therapy Notes  Date: 03.29.2017 Time: 9:30am Location: [address] Group Room   Group Topic: Stress Management  Goal Area(s) Addresses:  Patient will actively participate in stress management techniques presented during session.   Behavioral Response: Did not attend.  Marcee Jacobs L Neiko Trivedi, LRT/CTRS        Tomica Arseneault L 04/11/2015 9:52 AM 

## 2015-04-11 NOTE — Progress Notes (Signed)
Patient ID: Jake [name], male   DOB: 12/31/1970, 45 y.o.   MRN: [id number] D: Client in bed most of the evening, up for snacks, reports of he has been hospitalized before for alcoholism but "ready to quit" "getting tired of it" A: Writer provided emotional support. Medications reviewed administered as ordered. Client encouraged to report any concerns. Staff will monitor q5715min for safety. R: Client is safe on the unit.

## 2015-04-11 NOTE — [facility] Suicide Risk Assessment (Signed)
[facility] INPATIENT:  Family/Significant Other Suicide Prevention Education  Suicide Prevention Education:  Patient Refusal for Family/Significant Other Suicide Prevention Education: The patient Jake [name] has refused to provide written consent for family/significant other to be provided Family/Significant Other Suicide Prevention Education during admission and/or prior to discharge.  Physician notified. SPE reviewed with patient and brochure provided. Patient encouraged to return to hospital if having suicidal thoughts, patient verbalized his/her understanding and has no further questions at this time.   Jocelyn Nold, West CarboKristin L 04/11/2015, 4:12 PM
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
## 2015-04-11 NOTE — Tx Team (Signed)
Interdisciplinary Treatment Plan Update (Adult) Date: 04/11/2015    Time Reviewed: 9:30 AM  Progress in Treatment: Attending groups: Continuing to assess, patient new to milieu Participating in groups: Continuing to assess, patient new to milieu Taking medication as prescribed: Yes Tolerating medication: Yes Family/Significant other contact made: No, CSW assessing for appropriate contacts Patient understands diagnosis: Yes Discussing patient identified problems/goals with staff: Yes Medical problems stabilized or resolved: Yes Denies suicidal/homicidal ideation: Yes Issues/concerns per patient self-inventory: Yes Other:  New problem(s) identified: N/A  Discharge Plan or Barriers: CSW continuing to assess, patient new to milieu.  Reason for Continuation of Hospitalization:  Depression Anxiety Medication Stabilization   Comments: N/A  Estimated length of stay: 2-3 days    Patient is a 45 year old male with a diagnosis of Substance Induced Mood Disorder. Pt presented to the hospital with depression, SI, and substance abuse. Pt reports primary trigger(s) for admission was substance abuse and housing issues. Patient will benefit from crisis stabilization, medication evaluation, group therapy and psycho education in addition to case management for discharge planning. At discharge, it is recommended that Pt remain compliant with established discharge plan and continued treatment.   Review of initial/current patient goals per problem list:  1. Goal(s): Patient will participate in aftercare plan   Met: No   Target date: 3-5 days post admission date   As evidenced by: Patient will participate within aftercare plan AEB aftercare provider and housing plan at discharge being identified.  3/29: Goal not met: CSW assessing for appropriate referrals for pt and will have follow up secured prior to d/c.    2. Goal (s): Patient will exhibit decreased depressive symptoms and suicidal  ideations.   Met: No   Target date: 3-5 days post admission date   As evidenced by: Patient will utilize self rating of depression at 3 or below and demonstrate decreased signs of depression or be deemed stable for discharge by MD.  3/29: Goal not met: CSW assessing for appropriate referrals for pt and will have follow up secured prior to d/c.     3. Goal(s): Patient will demonstrate decreased signs and symptoms of anxiety.   Met: No   Target date: 3-5 days post admission date   As evidenced by: Patient will utilize self rating of anxiety at 3 or below and demonstrated decreased signs of anxiety, or be deemed stable for discharge by MD   3/29: Goal not met: Pt presents with anxious mood and affect.  Pt admitted with anxiety rating of 10.  Pt to show decreased sign of anxiety and a rating of 3 or less before d/c.   4. Goal(s): Patient will demonstrate decreased signs of withdrawal due to substance abuse   Met: No   Target date: 3-5 days post admission date   As evidenced by: Patient will produce a CIWA/COWS score of 0, have stable vitals signs, and no symptoms of withdrawal   3/29: CIWA score of 2, experiencing tremor and anxiety.   Attendees: Patient:    Family:    Physician: Dr. Parke Poisson; Dr. Sabra Heck 04/11/2015 9:30 AM  Nursing: Eulogio Bear, Elesa Massed, Harland German, RN 04/11/2015 9:30 AM  Clinical Social Worker: Tilden Fossa, LCSW 04/11/2015 9:30 AM  Other: Peri Maris, LCSWA; Spencer, LCSW  04/11/2015 9:30 AM  Other:  04/11/2015 9:30 AM  Other:  04/11/2015 9:30 AM  Other: May Dot Been Nwoko NP 04/11/2015 9:30 AM  Other:    Other:  Scribe for Treatment Team:  Tali Coster, LCSW [phone]     

## 2015-04-11 NOTE — Progress Notes (Signed)
NUTRITION ASSESSMENT  Pt identified as at risk on the Malnutrition Screen Tool  INTERVENTION: 1. Educated patient on the importance of nutrition and encouraged intake of food and beverages. 2. Discussed weight goals. 3. Supplements: will order Ensure Enlive po BID, each supplement provides 350 kcal and 20 grams of protein   NUTRITION DIAGNOSIS: Unintentional weight loss related to sub-optimal intake as evidenced by pt report.   Goal: Pt to meet >/= 90% of their estimated nutrition needs.  Monitor:  PO intake  Assessment:  Pt admitted with hx of alcohol abuse. Pt has been eating meals and snacks since admission. For the past 1 month he has been homeless. Pt has been depressed and was experiencing withdrawal symptoms PTA. Will order Ensure to assist with intakes and weight gain.   Pt has recently lost 8 lbs (6% body weight).  45 y.o. male  Height: Ht Readings from Last 1 Encounters:  04/10/15 5' 11.5" (1.816 m)    Weight: Wt Readings from Last 1 Encounters:  04/10/15 132 lb (59.875 kg)    Weight Hx: Wt Readings from Last 10 Encounters:  04/10/15 132 lb (59.875 kg)  04/10/15 140 lb (63.504 kg)  04/06/15 140 lb (63.504 kg)  04/06/15 140 lb (63.504 kg)  02/04/15 140 lb (63.504 kg)  03/08/14 124 lb (56.246 kg)  04/28/11 124 lb 5.4 oz (56.4 kg)    BMI:  Body mass index is 18.16 kg/(m^2). Pt meets criteria for underweight based on current BMI.  Estimated Nutritional Needs: Kcal: 25-30 kcal/kg Protein: > 1 gram protein/kg Fluid: 1 ml/kcal  Diet Order: Diet regular Room service appropriate?: Yes; Fluid consistency:: Thin Pt is also offered choice of unit snacks mid-morning and mid-afternoon.  Pt is eating as desired.   Lab results and medications reviewed.      Trenton GammonJessica Aissatou Fronczak, RD, LDN Inpatient Clinical Dietitian Pager # [phone] After hours/weekend pager # [phone]

## 2015-04-11 NOTE — Progress Notes (Signed)
[facility]  04/11/2015 6:20 PM Jake [name]  MRN:  [id number] Subjective:  Jake Brownsnthony states that he wants to get his life back together. 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 During the week days he does not have anything to do but as of lately drink. States that once he lost his placement he got to feel worst and the use of alcohol escalated Principal Problem: Severe recurrent major depression without psychotic features (HCC) Diagnosis:   Patient Active Problem List   Diagnosis Date Noted  . 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 Alcohol-induced mood disorder (HCC) [F10.94] 04/06/2015  . 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 Polysubstance abuse [F19.10] 03/09/2014  . Opioid dependence (HCC) [F11.20] 03/09/2014  . Alcohol use disorder, severe, dependence (HCC) [F10.20] 03/08/2014  . Substance induced mood disorder (HCC) [F19.94] 03/08/2014  . Alcohol abuse [F10.10] 06/28/2013  . Alcohol withdrawal (HCC) [F10.239] 06/28/2013  . DTs (delirium tremens) (HCC) [F10.231] 06/28/2013  . Marijuana abuse [F12.10] 04/28/2011  . Alcoholism (HCC) [F10.20] 04/27/2011  . Toxic encephalopathy [G92] 04/27/2011  . Psychosis [F29] 04/27/2011  . Leukocytosis [D72.829] 04/27/2011  . Hyponatremia [E87.1] 04/27/2011  . Thrombocytopenia (HCC) [D69.6] 04/27/2011  . Elevated AST (SGOT) [R74.0] 04/27/2011  . Elevated bilirubin [R17] 04/27/2011   Total Time spent with patient: 20 minutes  Past Psychiatric History: see admission H and P  Past Medical History:  Past Medical History  Diagnosis Date  . ETOH abuse   . Seizures (HCC)   . Hip dislocation   . Depression   . Anxiety    History reviewed. No pertinent past surgical history. Family History: History reviewed. No pertinent family history. Family Psychiatric  History: see admission H and P Social History:  History  Alcohol Use  . Yes      History  Drug Use  . Yes  . Special: Marijuana, Cocaine    Comment: herion, narcotics, benzo's, stimulants    Social History   Social History  . Marital Status: Single    Spouse Name: N/A  . Number of Children: N/A  . Years of Education: N/A   Social History Main Topics  . Smoking status: Current Every Day Smoker -- 2.00 packs/day    Types: Cigarettes  . Smokeless tobacco: Never Used  . Alcohol Use: Yes  . Drug Use: Yes    Special: Marijuana, Cocaine     Comment: herion, narcotics, benzo's, stimulants  . Sexual Activity: Not Asked   Other Topics Concern  . None   Social History Narrative   Additional Social History:                         Sleep: Fair  Appetite:  Fair  Current Medications: Current Facility-Administered Medications  Medication Dose Route Frequency Provider Last Rate Last Dose  . acetaminophen (TYLENOL) tablet 650 mg  650 mg Oral Q6H PRN Earney NavyJosephine C Onuoha, NP      . alum & mag hydroxide-simeth (MAALOX/MYLANTA) 200-200-20 MG/5ML suspension 30 mL  30 mL Oral Q4H PRN Earney NavyJosephine C Onuoha, NP      . feeding supplement (ENSURE ENLIVE) (ENSURE ENLIVE) liquid 237 mL  237 mL Oral BID BM Rachael FeeIrving A Clyde Upshaw, MD   237 mL at 04/11/15 1323  . hydrOXYzine (ATARAX/VISTARIL) tablet 25 mg  25 mg Oral Q6H PRN Rachael FeeIrving A Siyon Linck, MD      . ibuprofen (ADVIL,MOTRIN) tablet 600 mg  600 mg Oral Q8H PRN Earney Navy, NP      . loperamide (IMODIUM) capsule 2-4 mg  2-4 mg Oral PRN Rachael Fee, MD      . LORazepam (ATIVAN) tablet 1 mg  1 mg Oral TID Rachael Fee, MD   1 mg at 04/11/15 1322   Followed by  . [START ON 04/12/2015] LORazepam (ATIVAN) tablet 1 mg  1 mg Oral BID Rachael Fee, MD       Followed by  . [START ON 04/13/2015] LORazepam (ATIVAN) tablet 1 mg  1 mg Oral Daily Rachael Fee, MD      . magnesium hydroxide (MILK OF MAGNESIA) suspension 30 mL  30 mL Oral Daily PRN Earney Navy, NP      . meloxicam (MOBIC) tablet 7.5 mg  7.5 mg Oral BID Earney Navy, NP   7.5 mg at 04/11/15 0846  . multivitamin with minerals tablet 1 tablet  1 tablet Oral Daily Rachael Fee, MD   1 tablet at 04/11/15 0846  . nicotine (NICODERM CQ - dosed in mg/24 hours) patch 21 mg  21 mg Transdermal Daily Earney Navy, NP   21 mg at 04/11/15 1323  . ondansetron (ZOFRAN-ODT) disintegrating tablet 4 mg  4 mg Oral Q6H PRN Rachael Fee, MD      . thiamine (VITAMIN B-1) tablet 100 mg  100 mg Oral Daily Rachael Fee, MD   100 mg at 04/11/15 0846  . traZODone (DESYREL) tablet 100 mg  100 mg Oral QHS,MR X 1 Earney Navy, NP   100 mg at 04/10/15 2234  . zolpidem (AMBIEN) tablet 10 mg  10 mg Oral QHS PRN Rachael Fee, MD        Lab Results:  Results for orders placed or performed during the hospital encounter of 04/10/15 (from the past 48 hour(s))  Hepatitis panel, acute     Status: Abnormal   Collection Time: 04/11/15  6:16 AM  Result Value Ref Range   Hepatitis B Surface Ag Negative Negative   HCV Ab >11.0 (H) 0.0 - 0.9 s/co ratio    Comment: (NOTE)                                  Negative:     < 0.8                             Indeterminate: 0.8 - 0.9                                  Positive:     > 0.9 The CDC recommends that a positive HCV antibody result be followed up with a HCV Nucleic Acid Amplification test (161096). Performed At: [facility] [address] [place], [place] [id number] Mila Homer MD BJ:[id number]    Hep A IgM Negative Negative   Hep B C IgM Negative Negative    Comment: Performed at [facility]  HIV antibody     Status: None   Collection Time: 04/11/15  6:16 AM  Result Value Ref Range   HIV Screen 4th Generation wRfx Non Reactive Non Reactive    Comment: (NOTE) Performed At: [facility] [address] [place], [place] [id number] Mila Homer MD  ZO:[id number] Performed at [facility]     Blood Alcohol level:  Lab Results  Component Value Date    [facility] 265* 04/10/2015   [facility] 367* 04/06/2015    Physical Findings: AIMS: Facial and Oral Movements Muscles of Facial Expression: None, normal Lips and Perioral Area: None, normal Jaw: None, normal Tongue: None, normal,Extremity Movements Upper (arms, wrists, hands, fingers): None, normal Lower (legs, knees, ankles, toes): None, normal, Trunk Movements Neck, shoulders, hips: None, normal, Overall Severity Severity of abnormal movements (highest score from questions above): None, normal Incapacitation due to abnormal movements: None, normal Patient's awareness of abnormal movements (rate only patient's report): No Awareness, Dental Status Current problems with teeth and/or dentures?: No Does patient usually wear dentures?: No  CIWA:  CIWA-Ar Total: 0 COWS:     Musculoskeletal: Strength & Muscle Tone: within normal limits Gait & Station: normal Patient leans: normal  Psychiatric Specialty Exam: Review of Systems  Constitutional: Positive for malaise/fatigue.  HENT: Negative.   Eyes: Negative.   Respiratory: Negative.   Cardiovascular: Negative.   Gastrointestinal: Negative.   Genitourinary: Negative.   Musculoskeletal: Negative.   Skin: Negative.   Neurological: Positive for weakness.  Endo/Heme/Allergies: Negative.   Psychiatric/Behavioral: Positive for depression and substance abuse. The patient is nervous/anxious.     Blood pressure 121/81, pulse 73, temperature 97.7 F (36.5 C), temperature source Oral, resp. rate 17, height 5' 11.5" (1.816 m), weight 59.875 kg (132 lb).Body mass index is 18.16 kg/(m^2).  General Appearance: Fairly Groomed  Patent attorney::  Fair  Speech:  Clear and Coherent  Volume:  Normal  Mood:  Depressed  Affect:  Restricted  Thought Process:  Coherent and Goal Directed  Orientation:  Full (Time, Place, and Person)  Thought Content:  Symptoms events worries concerns  Suicidal Thoughts:  No  Homicidal Thoughts:  No  Memory:  Immediate;    Fair Recent;   Fair Remote;   Fair  Judgement:  Fair  Insight:  Present and Shallow  Psychomotor Activity:  Restlessness  Concentration:  Fair  Recall:  [employer] of Knowledge:Fair  Language: Fair  Akathisia:  No  Handed:  Right  AIMS (if indicated):     Assets:  Desire for Improvement  ADL's:  Intact  Cognition: WNL  Sleep:  Number of Hours: 6.75   Treatment Plan Summary: Daily contact with patient to assess and evaluate symptoms and progress in treatment and Medication management Supportive approach/coping skills Alcohol dependence; continue the Ativan detox protocol/work a relapse prevention plan Insomnia: continue the Ambien Work with CBT/mindfulness Salimata Christenson A, MD 04/11/2015, 6:20 PM

## 2015-04-11 NOTE — [facility] Group Notes (Signed)
[facility] LCSW Aftercare Discharge Planning Group Note  04/11/2015  8:45 AM  Participation Quality: Did Not Attend. Patient invited to participate but declined.  Brynn Mulgrew, MSW, LCSW Clinical Social Worker [facility] [phone]   

## 2015-04-12 LAB — HEPATITIS PANEL, ACUTE
HCV Ab: >11 s/co ratio — ABNORMAL HIGH (ref 0.0–0.9)
HEP A IGM: NEGATIVE
HEP B C IGM: NEGATIVE
HEP B S AG: NEGATIVE

## 2015-04-12 MED ORDER — HYDROXYZINE HCL 25 MG PO TABS
25.0000 mg | ORAL_TABLET | Freq: Four times a day (QID) | ORAL | Status: DC | PRN
  Administered 2015-04-12: 25 mg via ORAL
  Filled 2015-04-12: qty 1
  Filled 2015-04-12: qty 10
  Filled 2015-04-12: qty 7

## 2015-04-12 NOTE — Progress Notes (Signed)
D-  Patient reported that his withdrawal symptoms have decreased today.  He also reports a decrease in anxiety but rates his depression at a 7/10.  Patient denies SI, HI and AVH.  Patient stated the importance of sobriety.   A- Assess patient for safety, offer medications as prescribed, engage patient in 1:1 therapeutic staff talks.   R- Patient able to contract for safety.

## 2015-04-12 NOTE — [facility] Group Notes (Signed)
[facility] LCSW Group Therapy 04/12/2015  1:15 PM   Type of Therapy: Group Therapy  Participation Level: Did Not Attend. Patient invited to participate but declined.   Gloristine Turrubiates, MSW, LCSW Clinical Social Worker [facility] [phone]   

## 2015-04-12 NOTE — Progress Notes (Signed)
Patient ID: Jake [name], male   DOB: Sep 11, 1970, 45 y.o.   MRN: [id number] [facility] MD Progress Note  04/12/2015 6:44 PM Jake [name]  MRN:  [id number] Subjective:  [name] states that he wants to get his life back together. 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 He sill has to find a way of handling his leisure time when he is not working (mon to Friday) understands that he cant take the first drink Principal Problem: Severe recurrent major depression without psychotic features (HCC) Diagnosis:   Patient Active Problem List   Diagnosis Date Noted  . 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 Alcohol-induced mood disorder (HCC) [F10.94] 04/06/2015  . 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 Polysubstance abuse [F19.10] 03/09/2014  . Opioid dependence (HCC) [F11.20] 03/09/2014  . Alcohol use disorder, severe, dependence (HCC) [F10.20] 03/08/2014  . Substance induced mood disorder (HCC) [F19.94] 03/08/2014  . Alcohol abuse [F10.10] 06/28/2013  . Alcohol withdrawal (HCC) [F10.239] 06/28/2013  . DTs (delirium tremens) (HCC) [F10.231] 06/28/2013  . Marijuana abuse [F12.10] 04/28/2011  . Alcoholism (HCC) [F10.20] 04/27/2011  . Toxic encephalopathy [G92] 04/27/2011  . Psychosis [F29] 04/27/2011  . Leukocytosis [D72.829] 04/27/2011  . Hyponatremia [E87.1] 04/27/2011  . Thrombocytopenia (HCC) [D69.6] 04/27/2011  . Elevated AST (SGOT) [R74.0] 04/27/2011  . Elevated bilirubin [R17] 04/27/2011   Total Time spent with patient: 20 minutes  Past Psychiatric History: see admission H and P  Past Medical History:  Past Medical History  Diagnosis Date  . ETOH abuse   . Seizures (HCC)   . Hip dislocation   . Depression   . Anxiety    History reviewed. No pertinent past surgical history. Family History: History reviewed. No pertinent family  history. Family Psychiatric  History: see admission H and P Social History:  History  Alcohol Use  . Yes     History  Drug Use  . Yes  . Special: Marijuana, Cocaine    Comment: herion, narcotics, benzo's, stimulants    Social History   Social History  . Marital Status: Single    Spouse Name: N/A  . Number of Children: N/A  . Years of Education: N/A   Social History Main Topics  . Smoking status: Current Every Day Smoker -- 2.00 packs/day    Types: Cigarettes  . Smokeless tobacco: Never Used  . Alcohol Use: Yes  . Drug Use: Yes    Special: Marijuana, Cocaine     Comment: herion, narcotics, benzo's, stimulants  . Sexual Activity: Not Asked   Other Topics Concern  . None   Social History Narrative   Additional Social History:                         Sleep: Fair  Appetite:  Fair  Current Medications: Current Facility-Administered Medications  Medication Dose Route Frequency Provider Last Rate Last Dose  . acetaminophen (TYLENOL) tablet 650 mg  650 mg Oral Q6H PRN Earney Navy, NP      . alum & mag hydroxide-simeth (MAALOX/MYLANTA) 200-200-20 MG/5ML suspension 30 mL  30 mL Oral Q4H PRN Earney Navy, NP      . feeding supplement (ENSURE ENLIVE) (ENSURE ENLIVE) liquid 237 mL  237 mL Oral BID BM Rachael Fee, MD   237 mL at 04/11/15 1323  . hydrOXYzine (ATARAX/VISTARIL) tablet 25  mg  25 mg Oral Q6H PRN Rachael Fee, MD      . ibuprofen (ADVIL,MOTRIN) tablet 600 mg  600 mg Oral Q8H PRN Earney Navy, NP      . loperamide (IMODIUM) capsule 2-4 mg  2-4 mg Oral PRN Rachael Fee, MD      . Melene Muller ON 04/13/2015] LORazepam (ATIVAN) tablet 1 mg  1 mg Oral Daily Rachael Fee, MD   1 mg at 04/12/15 0810  . magnesium hydroxide (MILK OF MAGNESIA) suspension 30 mL  30 mL Oral Daily PRN Earney Navy, NP      . meloxicam (MOBIC) tablet 7.5 mg  7.5 mg Oral BID Earney Navy, NP   7.5 mg at 04/12/15 1732  . multivitamin with minerals tablet 1  tablet  1 tablet Oral Daily Rachael Fee, MD   1 tablet at 04/12/15 0810  . nicotine (NICODERM CQ - dosed in mg/24 hours) patch 21 mg  21 mg Transdermal Daily Earney Navy, NP   21 mg at 04/12/15 0809  . ondansetron (ZOFRAN-ODT) disintegrating tablet 4 mg  4 mg Oral Q6H PRN Rachael Fee, MD      . thiamine (VITAMIN B-1) tablet 100 mg  100 mg Oral Daily Rachael Fee, MD   100 mg at 04/12/15 0809  . traZODone (DESYREL) tablet 100 mg  100 mg Oral QHS,MR X 1 Earney Navy, NP   100 mg at 04/11/15 2234  . zolpidem (AMBIEN) tablet 10 mg  10 mg Oral QHS PRN Rachael Fee, MD        Lab Results:  Results for orders placed or performed during the hospital encounter of 04/10/15 (from the past 48 hour(s))  Hepatitis panel, acute     Status: Abnormal   Collection Time: 04/11/15  6:16 AM  Result Value Ref Range   Hepatitis B Surface Ag Negative Negative   HCV Ab >11.0 (H) 0.0 - 0.9 s/co ratio    Comment: (NOTE)                                  Negative:     < 0.8                             Indeterminate: 0.8 - 0.9                                  Positive:     > 0.9 The CDC recommends that a positive HCV antibody result be followed up with a HCV Nucleic Acid Amplification test (161096). Performed At: [facility] [address] [place], [place] [id number] Mila Homer MD BJ:[id number]    Hep A IgM Negative Negative   Hep B C IgM Negative Negative    Comment: Performed at [facility]  HIV antibody     Status: None   Collection Time: 04/11/15  6:16 AM  Result Value Ref Range   HIV Screen 4th Generation wRfx Non Reactive Non Reactive    Comment: (NOTE) Performed At: [facility] [address] [place], [place] [id number] Mila Homer MD GE:[id number] Performed at [facility]     Blood Alcohol level:  Lab Results  Component Value Date   [facility] 265* 04/10/2015  ETH 367* 04/06/2015    Physical Findings: AIMS:  Facial and Oral Movements Muscles of Facial Expression: None, normal Lips and Perioral Area: None, normal Jaw: None, normal Tongue: None, normal,Extremity Movements Upper (arms, wrists, hands, fingers): None, normal Lower (legs, knees, ankles, toes): None, normal, Trunk Movements Neck, shoulders, hips: None, normal, Overall Severity Severity of abnormal movements (highest score from questions above): None, normal Incapacitation due to abnormal movements: None, normal Patient's awareness of abnormal movements (rate only patient's report): No Awareness, Dental Status Current problems with teeth and/or dentures?: No Does patient usually wear dentures?: No  CIWA:  CIWA-Ar Total: 0 COWS:     Musculoskeletal: Strength & Muscle Tone: within normal limits Gait & Station: normal Patient leans: normal  Psychiatric Specialty Exam: Review of Systems  Constitutional: Positive for malaise/fatigue.  HENT: Negative.   Eyes: Negative.   Respiratory: Negative.   Cardiovascular: Negative.   Gastrointestinal: Negative.   Genitourinary: Negative.   Musculoskeletal: Negative.   Skin: Negative.   Neurological: Positive for weakness.  Endo/Heme/Allergies: Negative.   Psychiatric/Behavioral: Positive for depression and substance abuse. The patient is nervous/anxious.     Blood pressure 115/69, pulse 72, temperature 97.3 F (36.3 C), temperature source Oral, resp. rate 18, height 5' 11.5" (1.816 m), weight 59.875 kg (132 lb).Body mass index is 18.16 kg/(m^2).  General Appearance: Fairly Groomed  Patent attorneyye Contact::  Fair  Speech:  Clear and Coherent  Volume:  Normal  Mood:  Depressed  Affect:  Restricted  Thought Process:  Coherent and Goal Directed  Orientation:  Full (Time, Place, and Person)  Thought Content:  Symptoms events worries concerns  Suicidal Thoughts:  No  Homicidal Thoughts:  No  Memory:  Immediate;   Fair Recent;   Fair Remote;   Fair  Judgement:  Fair  Insight:  Present and  Shallow  Psychomotor Activity:  Restlessness  Concentration:  Fair  Recall:  FiservFair  Fund of Knowledge:Fair  Language: Fair  Akathisia:  No  Handed:  Right  AIMS (if indicated):     Assets:  Desire for Improvement  ADL's:  Intact  Cognition: WNL  Sleep:  Number of Hours: 6.75   Treatment Plan Summary: Daily contact with patient to assess and evaluate symptoms and progress in treatment and Medication management Supportive approach/coping skills Alcohol dependence; continue the Ativan detox protocol/work a relapse prevention plan Insomnia: continue the Ambien with the Trazodone Work with CBT/mindfulness/stress management/ problem solving Iyla Balzarini A, MD 04/12/2015, 6:44 PM

## 2015-04-12 NOTE — Progress Notes (Signed)
D:Patient observed in milieu. Patient states his goal is to "stay sober and do ADS outpatient. " Patient denies SI at this time.  A: Support and encouragement offered. Q 15 minute checks in progress and maintained. R: Patient remains safe on unit.

## 2015-04-13 MED ORDER — MELOXICAM 7.5 MG PO TABS: 7.5000 mg | ORAL_TABLET | Freq: Two times a day (BID) | ORAL | Status: DC

## 2015-04-13 MED ORDER — TRAZODONE HCL 100 MG PO TABS: 100.0000 mg | ORAL_TABLET | Freq: Every evening | ORAL | Status: DC | PRN

## 2015-04-13 MED ORDER — ZOLPIDEM TARTRATE 10 MG PO TABS: 10.0000 mg | ORAL_TABLET | Freq: Every evening | ORAL | Status: DC | PRN

## 2015-04-13 MED ORDER — NICOTINE 21 MG/24HR TD PT24: 21.0000 mg | MEDICATED_PATCH | Freq: Every day | TRANSDERMAL | Status: DC

## 2015-04-13 MED ORDER — HYDROXYZINE HCL 25 MG PO TABS: 25.0000 mg | ORAL_TABLET | Freq: Four times a day (QID) | ORAL | Status: DC | PRN

## 2015-04-13 MED ORDER — ADULT MULTIVITAMIN W/MINERALS CH: 1.0000 | ORAL_TABLET | Freq: Every day | ORAL | Status: DC

## 2015-04-13 NOTE — Progress Notes (Signed)
D: Patient alert and cooperative. Pt c/o cramp and tremors. Pt mood/affect is anxious and sad. Denies SI/HI/AVH. No acute distressed noted at this time.  A: Medications administered as prescribed. Emotional support given and will continue to monitor pt's progress for stabilization. R: Patient remains safe and complaint with medications.

## 2015-04-13 NOTE — [facility] Suicide Risk Assessment (Signed)
[facility] Discharge Suicide Risk Assessment   Principal Problem: Severe recurrent major depression without psychotic features [facility]) Discharge Diagnoses:  Patient Active Problem List   Diagnosis Date Noted  . 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 Alcohol-induced mood disorder (HCC) [F10.94] 04/06/2015  . 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 Polysubstance abuse [F19.10] 03/09/2014  . Opioid dependence (HCC) [F11.20] 03/09/2014  . Alcohol use disorder, severe, dependence (HCC) [F10.20] 03/08/2014  . Substance induced mood disorder (HCC) [F19.94] 03/08/2014  . Alcohol abuse [F10.10] 06/28/2013  . Alcohol withdrawal (HCC) [F10.239] 06/28/2013  . DTs (delirium tremens) (HCC) [F10.231] 06/28/2013  . Marijuana abuse [F12.10] 04/28/2011  . Alcoholism (HCC) [F10.20] 04/27/2011  . Toxic encephalopathy [G92] 04/27/2011  . Psychosis [F29] 04/27/2011  . Leukocytosis [D72.829] 04/27/2011  . Hyponatremia [E87.1] 04/27/2011  . Thrombocytopenia (HCC) [D69.6] 04/27/2011  . Elevated AST (SGOT) [R74.0] 04/27/2011  . Elevated bilirubin [R17] 04/27/2011    Total Time spent with patient: 20 minutes  Musculoskeletal: Strength & Muscle Tone: within normal limits Gait & Station: normal Patient leans: normal  Psychiatric Specialty Exam: Review of Systems  Constitutional: Negative.   HENT: Negative.   Eyes: Negative.   Respiratory: Negative.   Cardiovascular: Negative.   Gastrointestinal: Negative.   Genitourinary: Negative.   Musculoskeletal: Negative.   Skin: Negative.   Neurological: Negative.   Endo/Heme/Allergies: Negative.   Psychiatric/Behavioral: Positive for substance abuse.    Blood pressure 95/58, pulse 96, temperature 97.7 F (36.5 C), temperature source Oral, resp. rate 16, height 5' 11.5" (1.816 m), weight 59.875 kg (132 lb).Body mass index is 18.16 kg/(m^2).  General Appearance: Fairly  Groomed  Patent attorney::  Fair  Speech:  Clear and Coherent409  Volume:  Normal  Mood:  Euthymic  Affect:  Appropriate  Thought Process:  Coherent and Goal Directed  Orientation:  Full (Time, Place, and Person)  Thought Content:  plans as he moves on, relapse prevention plan  Suicidal Thoughts:  No  Homicidal Thoughts:  No  Memory:  Immediate;   Fair Recent;   Fair Remote;   Fair  Judgement:  Fair  Insight:  Present  Psychomotor Activity:  Normal  Concentration:  Fair  Recall:  [employer] of Knowledge:Fair  Language: Fair  Akathisia:  No  Handed:  Right  AIMS (if indicated):     Assets:  Desire for Improvement  Sleep:  Number of Hours: 6.5  Cognition: WNL  ADL's:  Intact  In full contact with reality. There are no active S/S of withdrawal. There are no active SI plans or intent. 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 States he will be going to AA Mental Status Per Nursing Assessment::   On Admission:  Suicidal ideation indicated by patient, Suicide plan, Plan includes specific time, place, or method, Self-harm thoughts, Self-harm behaviors  Demographic Factors:  Male and Caucasian  Loss Factors: Financial problems/change in socioeconomic status  Historical Factors: none identified  Risk Reduction Factors:   Employed  Continued Clinical Symptoms:  Depression:   Comorbid alcohol abuse/dependence Alcohol/Substance Abuse/Dependencies  Cognitive Features That Contribute To Risk:  None    Suicide Risk:  Minimal: No identifiable suicidal ideation.  Patients presenting with no risk factors but with morbid ruminations; may be classified as minimal risk based on the severity of the depressive symptoms  Follow-up Information    [facility]   Specialty:  Behavioral Health   Why:  Walk-in clinic Mondays, Wednesdays, and Fridays between 9am to 11:30am for assessment for therapy and medication management  services. Please arrive early in the morning in order to be seen as quickly as possible.    Contact information:   [address] [place] [address] [phone]       Plan Of Care/Follow-up recommendations:  Activity:  as tolerated Diet:  regular Follow up as above Alyviah Crandle A, MD 04/13/2015, 6:16 PM

## 2015-04-13 NOTE — [facility] Group Notes (Signed)
[facility] LCSW Group Therapy 04/13/2015 1:15 PM Type of Therapy: Group Therapy Participation Level: Limited  Participation Quality: Attentive  Affect: Blunted  Cognitive: Alert and Oriented  Insight: Developing/Improving and Engaged  Engagement in Therapy: Developing/Improving and Engaged  Modes of Intervention: Clarification, Confrontation, Discussion, Education, Exploration, Limit-setting, Orientation, Problem-solving, Rapport Building, Dance movement psychotherapisteality Testing, Socialization and Support  Summary of Progress/Problems: The topic for today was feelings about relapse. Pt discussed what relapse prevention is to them and identified triggers that they are on the path to relapse. Pt processed their feeling towards relapse and was able to relate to peers. Pt discussed coping skills that can be used for relapse prevention. Patient participated minimally in discussion despite CSW encouragement.    Samuella BruinKristin Azarya Oconnell, MSW, LCSW Clinical Social Worker [facility] [phone]

## 2015-04-13 NOTE — Progress Notes (Signed)
Recreation Therapy Notes  Date: 03.31.2017 Time: 9:30am Location: [address] Group Room   Group Topic: Stress Management  Goal Area(s) Addresses:  Patient will actively participate in stress management techniques presented during session.   Behavioral Response: Did not attend.   Kaycee Haycraft L Nashonda Limberg, LRT/CTRS        Caroleena Paolini L 04/13/2015 2:04 PM 

## 2015-04-13 NOTE — Progress Notes (Signed)
  [facility] :  Will you be returning to the same living situation after discharge:  Yes,  patient plans to return to previous living situation At discharge, do you have transportation home?: Yes,  bus pass will be provided Do you have the ability to pay for your medications: Yes,  patient will be provided with prescriptions at discharge  Release of information consent forms completed and in the chart;  Patient's signature needed at discharge.  Patient to Follow up at: Follow-up Information    [facility] with [facility]   Specialty:  Behavioral Health   Why:  Walk-in clinic Mondays, Wednesdays, and Fridays between 9am to 11:30am for assessment for therapy and medication management services. Please arrive early in the morning in order to be seen as quickly as possible.    Contact information:   [address] [place] [address] [phone]       Next level of care provider [facility] Link:no  Safety Planning and Suicide Prevention discussed: Yes,  with patient  Have you used any form of tobacco in the last 30 days? (Cigarettes, Smokeless Tobacco, Cigars, and/or Pipes): Yes  Has patient been referred to the Quitline?: Patient refused referral  Patient has been referred for addiction treatment: Yes  Cabrini Ruggieri, [place] CarboKristin L 04/13/2015, 10:37 AM

## 2015-04-13 NOTE — Discharge Summary (Signed)
[facility]-Observation Unit Summary Note  Patient:  Jake [name] is an 45 y.o., male MRN:  [id number] DOB:  Feb 04, 1970 Patient phone:  [phone] (home)  Patient address:   [address] [place] [address],  Total Time spent with patient: 30 minutes  Date of Admission:  04/10/2015  Date of Discharge: 04/13/2015  Reason for Admission:  Alcohol detox  Principal Problem: Severe recurrent major depression without psychotic features [facility])  Discharge Diagnoses: Patient Active Problem List   Diagnosis Date Noted  . 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 Alcohol-induced mood disorder (HCC) [F10.94] 04/06/2015  . 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 Polysubstance abuse [F19.10] 03/09/2014  . Opioid dependence (HCC) [F11.20] 03/09/2014  . Alcohol use disorder, severe, dependence (HCC) [F10.20] 03/08/2014  . Substance induced mood disorder (HCC) [F19.94] 03/08/2014  . Alcohol abuse [F10.10] 06/28/2013  . Alcohol withdrawal (HCC) [F10.239] 06/28/2013  . DTs (delirium tremens) (HCC) [F10.231] 06/28/2013  . Marijuana abuse [F12.10] 04/28/2011  . Alcoholism (HCC) [F10.20] 04/27/2011  . Toxic encephalopathy [G92] 04/27/2011  . Psychosis [F29] 04/27/2011  . Leukocytosis [D72.829] 04/27/2011  . Hyponatremia [E87.1] 04/27/2011  . Thrombocytopenia (HCC) [D69.6] 04/27/2011  . Elevated AST (SGOT) [R74.0] 04/27/2011  . Elevated bilirubin [R17] 04/27/2011   Past Psychiatric History: Alcoholism, Major depression.  Past Medical History:  Past Medical History  Diagnosis Date  . ETOH abuse   . Seizures (HCC)   . Hip dislocation   . Depression   . Anxiety    History reviewed. No pertinent past surgical history. Family History: History reviewed. No pertinent family history.  Family Psychiatric  History: Alcoholism: Father.  Social History:  History  Alcohol Use  . Yes     History   Drug Use  . Yes  . Special: Marijuana, Cocaine    Comment: herion, narcotics, benzo's, stimulants    Social History   Social History  . Marital Status: Single    Spouse Name: N/A  . Number of Children: N/A  . Years of Education: N/A   Social History Main Topics  . Smoking status: Current Every Day Smoker -- 2.00 packs/day    Types: Cigarettes  . Smokeless tobacco: Never Used  . Alcohol Use: Yes  . Drug Use: Yes    Special: Marijuana, Cocaine     Comment: herion, narcotics, benzo's, stimulants  . Sexual Activity: Not Asked   Other Topics Concern  . None   Social History Narrative   Hospital Course: 45 Y/O male who states he has been drinking a lot every day for a year. States he quit for about a month and slowly start building up. States work was postponed for 4 weeks and he did not have the money to pay his rent and he was kicked out. For the last month he has been homeless. Has been increasingly more depressed with suicidal ideas. States he has had seizures and DT's coming off the alcohol. States that since he left the hospital last time he has not injected heroin.  Jake [name] was admitted to the hospital with a BAL of 265 per toxicology tests results. He admits having been drinking a lot & it has worsened. He reported his stressors to be financial issues & homelessness.  He was here for alcohol detox. Jake [name]'s recent lab reports indicated elevated liver enzymes (AST & ALT), possibly from chronic alcoholism. As a result, not a candidate for Librium detoxification treatment protocols. This is because  Librium is a long acting Benzodiazepine, not suitable for a compromised liver enzymes. His detoxification treatment was achieved using Ativan detox regimen on a tapering dose format. By using Ativan detox regimen, Jake [name] received a cleaner detoxification treatment without the lingering adverse effects of the Librium capsules in his systems. He was enrolled in the group counseling  sessions, AA/NA meetings being offered and held on this unit. He participated and learned coping skills. He tolerated his treatment regimen without any significant adverse effects and or reactions reported.  Besides the detoxification treatments, Jake [name] was also medicated & discharged on; Trazodone 100 mg for insomnia, Hydroxyzine 25 mg prn for anxiety & Ambien 10 mg for insomnia. He also received other medication regimen for the medical issues presented. Jake [name] has completed detox treatment and his mood is stable. This is evidenced by his reports of improved mood and absence substance withdrawal symptoms. He is currently being discharged to continue substance abuse/psychiatric treatments as noted below. He was encouraged to join/attend AA/NA meetings being offered and held within his community to achieve & maintain maximum sobriety.   Upon discharge, Jake [name] adamantly denies any suicidal, homicidal ideations, auditory, visual hallucinations, delusional thoughts, paranoia & or withdrawal symptoms. He left [facility] with all personal belongings in no apparent distress. He received a 7 days worth supply samples of his [facility] discharge medications provided by [facility] pharmacy. Transportation per city bus. Bus pass provided by [facility].  Physical Findings: AIMS: Facial and Oral Movements Muscles of Facial Expression: None, normal Lips and Perioral Area: None, normal Jaw: None, normal Tongue: None, normal,Extremity Movements Upper (arms, wrists, hands, fingers): None, normal Lower (legs, knees, ankles, toes): None, normal, Trunk Movements Neck, shoulders, hips: None, normal, Overall Severity Severity of abnormal movements (highest score from questions above): None, normal Incapacitation due to abnormal movements: None, normal Patient's awareness of abnormal movements (rate only patient's report): No Awareness, Dental Status Current problems with teeth and/or dentures?: No Does patient usually wear dentures?: No   CIWA:  CIWA-Ar Total: 0 COWS:     Musculoskeletal: Strength & Muscle Tone: within normal limits Gait & Station: normal Patient leans: N/A  Psychiatric Specialty Exam: Review of Systems  Constitutional: Negative.   HENT: Negative.   Eyes: Negative.   Respiratory: Negative.   Cardiovascular: Negative.   Gastrointestinal: Negative.   Genitourinary: Negative.   Musculoskeletal: Negative.   Skin: Negative.   Neurological: Negative.   Endo/Heme/Allergies: Negative.   Psychiatric/Behavioral: Positive for substance abuse (Alcoholism, chronic). Negative for depression, suicidal ideas, hallucinations and memory loss. The patient is not nervous/anxious and does not have insomnia.     Blood pressure 95/58, pulse 96, temperature 97.7 F (36.5 C), temperature source Oral, resp. rate 16, height 5' 11.5" (1.816 m), weight 59.875 kg (132 lb).Body mass index is 18.16 kg/(m^2).  See Md's SRA   Have you used any form of tobacco in the last 30 days? (Cigarettes, Smokeless Tobacco, Cigars, and/or Pipes): Yes  Has this patient used any form of tobacco in the last 30 days? (Cigarettes, Smokeless Tobacco, Cigars, and/or Pipes) Yes, Yes, A prescription for an FDA-approved tobacco cessation medication was offered at discharge and the patient refused  Blood Alcohol level:  Lab Results  Component Value Date   ETH 265* 04/10/2015   ETH 367* 04/06/2015   Metabolic Disorder Labs:  No results found for: HGBA1C, MPG No results found for: PROLACTIN No results found for: CHOL, TRIG, HDL, CHOLHDL, VLDL, LDLCALC  Discharge destination:  Home  Is patient on multiple antipsychotic therapies  at discharge:  No   Has Patient had three or more failed trials of antipsychotic monotherapy by history:  No  Recommended Plan for Multiple Antipsychotic Therapies: NA    Medication List    TAKE these medications      Indication   hydrOXYzine 25 MG tablet  Commonly known as:  ATARAX/VISTARIL  Take 1 tablet (25  mg total) by mouth every 6 (six) hours as needed for anxiety.   Indication:  Anxiety     meloxicam 7.5 MG tablet  Commonly known as:  MOBIC  Take 1 tablet (7.5 mg total) by mouth 2 (two) times daily. For arthritis pain   Indication:  Joint Damage causing Pain and Loss of Function     multivitamin with minerals Tabs tablet  Take 1 tablet by mouth daily. For low Vitamin   Indication:  Vitamin Supplementation     nicotine 21 mg/24hr patch  Commonly known as:  NICODERM CQ - dosed in mg/24 hours  Place 1 patch (21 mg total) onto the skin daily. For Nicotine addiction   Indication:  Nicotine Addiction     traZODone 100 MG tablet  Commonly known as:  DESYREL  Take 1 tablet (100 mg total) by mouth at bedtime and may repeat dose one time if needed. For sleep   Indication:  Trouble Sleeping     zolpidem 10 MG tablet  Commonly known as:  AMBIEN  Take 1 tablet (10 mg total) by mouth at bedtime as needed for sleep.   Indication:  Trouble Sleeping       Follow-up Information    [facility]   Specialty:  Behavioral Health   Why:  Walk-in clinic Mondays, Wednesdays, and Fridays between 9am to 11:30am for assessment for therapy and medication management services. Please arrive early in the morning in order to be seen as quickly as possible.    Contact information:   [address] [place] [address] [phone]      Follow-up recommendations:  Activity:  As tolerated Diet: As recommended by your primary care doctor. Keep all scheduled follow-up appointments as recommended.  Comments:   Take all your medications as prescribed by your mental healthcare provider.  Report any adverse effects and or reactions from your medicines to your outpatient provider promptly.  Patient is instructed and cautioned to not engage in alcohol and or illegal drug use while on prescription medicines.  In the event of worsening symptoms, patient is instructed to  call the crisis hotline, 911 and or go to the nearest ED for appropriate evaluation and treatment of symptoms.  Follow-up with your primary care provider for your other medical issues, concerns and or health care needs.   Signed: Sanjuana KavaNwoko, Agnes I, NP, PMHNP, FNP-BC 04/13/2015, 3:10 PM  I personally assessed the patient and formulated the plan Madie RenoIrving A. Dub MikesLugo, M.D.

## 2015-04-13 NOTE — Progress Notes (Signed)
Patient ID: Jake [name], male   DOB: 03/26/1970, 45 y.o.   MRN: [id number]  Pt currently presents with a flat affect and cooperative behavior. Per self inventory, pt rates depression, hopelessness and anxiety at a 7. Pt's daily goal is to "stay sober" and they intend to do so by "don't use." Pt reports good sleep, a good appetite, normal energy and good concentration. Pt remains in bed for most of the morning, chooses not to attend group.   Pt provided with medications per providers orders. Pt's labs and vitals were monitored throughout the day. Pt supported emotionally and encouraged to express concerns and questions. Pt educated on medications.  Pt's safety ensured with 15 minute and environmental checks. Pt currently denies SI/HI and A/V hallucinations. Pt verbally agrees to seek staff if SI/HI or A/VH occurs and to consult with staff before acting on these thoughts. Will continue POC.

## 2015-04-13 NOTE — Progress Notes (Signed)
D: Patient interacted with this Clinical research associatewriter. Patient states ready for discharge in am. Patient states his goal is " to stay sober." Patient plans to get in support group at discharge. Support offered. A: Support and encouragement offered. Q 15 minute checks in progress and maintained for safety. R: Patient remains safe on unit.

## 2015-04-13 NOTE — Tx Team (Signed)
Interdisciplinary Treatment Plan Update (Adult) Date: 04/13/2015    Time Reviewed: 9:30 AM  Progress in Treatment: Attending groups: Intermittently Participating in groups: Yes, when he attends Taking medication as prescribed: Yes Tolerating medication: Yes Family/Significant other contact made: No, patient declined collateral contact Patient understands diagnosis: Yes Discussing patient identified problems/goals with staff: Yes Medical problems stabilized or resolved: Yes Denies suicidal/homicidal ideation: Yes Issues/concerns per patient self-inventory: Yes Other:  New problem(s) identified: N/A  Discharge Plan or Barriers: Patient plans to return to previous living situation to follow up with outpatient services at [facility].  Reason for Continuation of Hospitalization:  Depression Anxiety Medication Stabilization   Comments: N/A  Estimated length of stay: Discharge anticipated for 04/14/15    Patient is a 45 year old male with a diagnosis of Substance Induced Mood Disorder. Pt presented to the hospital with depression, SI, and substance abuse. Pt reports primary trigger(s) for admission was substance abuse and housing issues. Patient will benefit from crisis stabilization, medication evaluation, group therapy and psycho education in addition to case management for discharge planning. At discharge, it is recommended that Pt remain compliant with established discharge plan and continued treatment.   Review of initial/current patient goals per problem list:  1. Goal(s): Patient will participate in aftercare plan   Met: Yes   Target date: 3-5 days post admission date   As evidenced by: Patient will participate within aftercare plan AEB aftercare provider and housing plan at discharge being identified.  3/29: Goal not met: CSW assessing for appropriate referrals for pt and will have follow up secured prior to d/c.  3/31: Goal met. Patient plans to return home to previous  living situation to follow up with outpatient services at [facility].     2. Goal (s): Patient will exhibit decreased depressive symptoms and suicidal ideations.   Met: Adequate for discharge per MD   Target date: 3-5 days post admission date   As evidenced by: Patient will utilize self rating of depression at 3 or below and demonstrate decreased signs of depression or be deemed stable for discharge by MD.  3/29: Goal not met: CSW assessing for appropriate referrals for pt and will have follow up secured prior to d/c.  3/31: Adequate for discharge. Patient reports improved levels of depression, rates it at 7 today, denies SI.     3. Goal(s): Patient will demonstrate decreased signs and symptoms of anxiety.   Met: Adequate for discharge per MD   Target date: 3-5 days post admission date   As evidenced by: Patient will utilize self rating of anxiety at 3 or below and demonstrated decreased signs of anxiety, or be deemed stable for discharge by MD   3/29: Goal not met: Pt presents with anxious mood and affect.  Pt admitted with anxiety rating of 10.  Pt to show decreased sign of anxiety and a rating of 3 or less before d/c.  3/31: Adequate for discharge per MD. Patient reports improvement in his anxiety symptoms, rates anxiety at 7 today.   4. Goal(s): Patient will demonstrate decreased signs of withdrawal due to substance abuse   Met: Yes   Target date: 3-5 days post admission date   As evidenced by: Patient will produce a CIWA/COWS score of 0, have stable vitals signs, and no symptoms of withdrawal   3/29: CIWA score of 2, experiencing tremor and anxiety.   3/31: Goal met. No withdrawal symptoms reported at this time per medical chart.    Attendees: Patient:  04/13/2015 9:30 AM   Family:   04/13/2015 9:30 AM   Physician:  Dr. Carlton Adam, MD 04/13/2015 9:30 AM   Nursing:  Soledad Gerlach Desma Paganini, RN 04/13/2015 9:30 AM   Clinical Social Worker: Maxie Better, LCSW 04/13/2015 9:30 AM   Clinical Social Worker: Erasmo Downer Bedford Winsor LCSW; Peri Maris LCSWA 04/13/2015 9:30 AM   Other:  Gerline Legacy Nurse Case Manager 04/13/2015 9:30 AM   Other:  Agustina Caroli, Samuel Jester, NP 04/13/2015 9:30 AM   Other:   04/13/2015 9:30 AM   Other: Norberto Sorenson, P4CC 04/13/2015 9:30 AM   Other:  04/13/2015 9:30 AM   Other:  04/13/2015 9:30 AM       Scribe for Treatment Team:  Tilden Fossa, [place]

## 2015-04-13 NOTE — [facility] Group Notes (Signed)
   [facility] Note  04/13/2015  8:45 AM   Participation Quality: Alert, Appropriate and Oriented  Mood/Affect: Blunted  Depression Rating: 7  Anxiety Rating: 7  Thoughts of Suicide: Pt denies SI/HI  Will you contract for safety? Yes  Current AVH: Pt denies  Plan for Discharge/Comments: Pt attended discharge planning group and actively participated in group. CSW provided pt with today's workbook. Patient plans to return to previous living situation to follow up with [facility] for outpatient services.   Transportation Means: Pt reports access to transportation- requesting bus pass.  Supports: No supports mentioned at this time  Samuella BruinKristin Derrious Bologna, MSW, LCSW Clinical Social Worker [facility] [phone]

## 2015-04-13 NOTE — Progress Notes (Signed)
Patient attended wrap-up group and rated his day as fair at a 5. Goal is to stay sober.

## 2015-04-13 NOTE — Progress Notes (Signed)
Patient ID: Jake [name], male   DOB: 05/17/1970, 45 y.o.   MRN: [id number] [facility]  04/13/2015 4:59 PM Jake [name]  MRN:  [id number] Subjective:  Jake [name] states that he wants to get his life back together. He will like to leave in the AM. He contacted his employer and he is going to pick him up early tomorrow so he can work.  He still has to find a way of handling his leisure time when he is not working (mon to Friday) understands that he cant take the first drink. He plans to start going to meetings and try to create a structure. He is sleeping well Principal Problem: Severe recurrent major depression without psychotic features (HCC) Diagnosis:   Patient Active Problem List   Diagnosis Date Noted  . 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 Alcohol-induced mood disorder (HCC) [F10.94] 04/06/2015  . 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 Polysubstance abuse [F19.10] 03/09/2014  . Opioid dependence (HCC) [F11.20] 03/09/2014  . Alcohol use disorder, severe, dependence (HCC) [F10.20] 03/08/2014  . Substance induced mood disorder (HCC) [F19.94] 03/08/2014  . Alcohol abuse [F10.10] 06/28/2013  . Alcohol withdrawal (HCC) [F10.239] 06/28/2013  . DTs (delirium tremens) (HCC) [F10.231] 06/28/2013  . Marijuana abuse [F12.10] 04/28/2011  . Alcoholism (HCC) [F10.20] 04/27/2011  . Toxic encephalopathy [G92] 04/27/2011  . Psychosis [F29] 04/27/2011  . Leukocytosis [D72.829] 04/27/2011  . Hyponatremia [E87.1] 04/27/2011  . Thrombocytopenia (HCC) [D69.6] 04/27/2011  . Elevated AST (SGOT) [R74.0] 04/27/2011  . Elevated bilirubin [R17] 04/27/2011   Total Time spent with patient: 20 minutes  Past Psychiatric History: see admission H and P  Past Medical History:  Past Medical History  Diagnosis Date  . ETOH abuse   . Seizures (HCC)   . Hip dislocation   . Depression   . Anxiety    History reviewed. No pertinent past surgical history. Family History: History reviewed. No pertinent family history. Family Psychiatric  History: see admission H and P Social History:  History  Alcohol Use  . Yes     History  Drug Use  . Yes  . Special: Marijuana, Cocaine    Comment: herion, narcotics, benzo's, stimulants    Social History   Social History  . Marital Status: Single    Spouse Name: N/A  . Number of Children: N/A  . Years of Education: N/A   Social History Main Topics  . Smoking status: Current Every Day Smoker -- 2.00 packs/day    Types: Cigarettes  . Smokeless tobacco: Never Used  . Alcohol Use: Yes  . Drug Use: Yes    Special: Marijuana, Cocaine     Comment: herion, narcotics, benzo's, stimulants  . Sexual Activity: Not Asked   Other Topics Concern  . None   Social History Narrative   Additional Social History:                         Sleep: Fair  Appetite:  Fair  Current Medications: Current Facility-Administered Medications  Medication Dose Route Frequency Provider Last Rate Last Dose  . acetaminophen (TYLENOL) tablet 650 mg  650 mg Oral Q6H PRN Earney NavyJosephine C Onuoha, NP      . alum & mag hydroxide-simeth (MAALOX/MYLANTA) 200-200-20 MG/5ML suspension 30 mL  30 mL Oral Q4H PRN Earney NavyJosephine C Onuoha, NP      . feeding supplement (ENSURE ENLIVE) (ENSURE ENLIVE) liquid 237 mL  237 mL Oral BID BM Rachael Fee, MD   237 mL at 04/13/15 1400  . hydrOXYzine (ATARAX/VISTARIL) tablet 25 mg  25 mg Oral Q6H PRN Rachael Fee, MD   25 mg at 04/12/15 2144  . ibuprofen (ADVIL,MOTRIN) tablet 600 mg  600 mg Oral Q8H PRN Earney Navy, NP      . magnesium hydroxide (MILK OF MAGNESIA) suspension 30 mL  30 mL Oral Daily PRN Earney Navy, NP      . meloxicam (MOBIC) tablet 7.5 mg  7.5 mg Oral BID Earney Navy, NP   7.5 mg at 04/13/15 0842  . multivitamin with minerals tablet 1 tablet  1 tablet Oral Daily Rachael Fee, MD   1 tablet at  04/13/15 [phone]  . nicotine (NICODERM CQ - dosed in mg/24 hours) patch 21 mg  21 mg Transdermal Daily Earney Navy, NP   21 mg at 04/13/15 0842  . thiamine (VITAMIN B-1) tablet 100 mg  100 mg Oral Daily Rachael Fee, MD   100 mg at 04/13/15 [phone]  . traZODone (DESYREL) tablet 100 mg  100 mg Oral QHS,MR X 1 Earney Navy, NP   100 mg at 04/12/15 2144  . zolpidem (AMBIEN) tablet 10 mg  10 mg Oral QHS PRN Rachael Fee, MD        Lab Results:  No results found for this or any previous visit (from the past 48 hour(s)).  Blood Alcohol level:  Lab Results  Component Value Date   ETH 265* 04/10/2015   ETH 367* 04/06/2015    Physical Findings: AIMS: Facial and Oral Movements Muscles of Facial Expression: None, normal Lips and Perioral Area: None, normal Jaw: None, normal Tongue: None, normal,Extremity Movements Upper (arms, wrists, hands, fingers): None, normal Lower (legs, knees, ankles, toes): None, normal, Trunk Movements Neck, shoulders, hips: None, normal, Overall Severity Severity of abnormal movements (highest score from questions above): None, normal Incapacitation due to abnormal movements: None, normal Patient's awareness of abnormal movements (rate only patient's report): No Awareness, Dental Status Current problems with teeth and/or dentures?: No Does patient usually wear dentures?: No  CIWA:  CIWA-Ar Total: 0 COWS:     Musculoskeletal: Strength & Muscle Tone: within normal limits Gait & Station: normal Patient leans: normal  Psychiatric Specialty Exam: Review of Systems  Constitutional: Positive for malaise/fatigue.  HENT: Negative.   Eyes: Negative.   Respiratory: Negative.   Cardiovascular: Negative.   Gastrointestinal: Negative.   Genitourinary: Negative.   Musculoskeletal: Negative.   Skin: Negative.   Neurological: Positive for weakness.  Endo/Heme/Allergies: Negative.   Psychiatric/Behavioral: Positive for depression and substance abuse. The  patient is nervous/anxious.     Blood pressure 95/58, pulse 96, temperature 97.7 F (36.5 C), temperature source Oral, resp. rate 16, height 5' 11.5" (1.816 m), weight 59.875 kg (132 lb).Body mass index is 18.16 kg/(m^2).  General Appearance: Fairly Groomed  Patent attorney::  Fair  Speech:  Clear and Coherent  Volume:  Normal  Mood:  Depressed  Affect:  Restricted  Thought Process:  Coherent and Goal Directed  Orientation:  Full (Time, Place, and Person)  Thought Content:  Symptoms events worries concerns  Suicidal Thoughts:  No  Homicidal Thoughts:  No  Memory:  Immediate;   Fair Recent;   Fair Remote;   Fair  Judgement:  Fair  Insight:  Present and Shallow  Psychomotor Activity:  Restlessness  Concentration:  Fair  Recall:  [employer] of Knowledge:Fair  Language: Fair  Akathisia:  No  Handed:  Right  AIMS (if indicated):     Assets:  Desire for Improvement  ADL's:  Intact  Cognition: WNL  Sleep:  Number of Hours: 6.5   Treatment Plan Summary: Daily contact with patient to assess and evaluate symptoms and progress in treatment and Medication management Supportive approach/coping skills Alcohol dependence; continue the Ativan detox protocol/work a relapse prevention plan Insomnia: continue the Ambien with the Trazodone Work with CBT/mindfulness/stress management/ problem solving Reassess in the AM for D/C Mallery Harshman A, MD 04/13/2015, 4:59 PM

## 2015-04-14 NOTE — Progress Notes (Signed)
Nursing Discharge Note:  D:Patient denies SI/HI at this time. Pt appears calm and cooperative, and no distress noted.  A: All Personal items in locker returned to pt. Pt escorted out of the building.  R:  Pt States he will comply with outpatient services, and take MEDS as prescribed.

## 2015-04-23 ENCOUNTER — Emergency Department (HOSPITAL_COMMUNITY)
Admission: EM | Admit: 2015-04-23 | Discharge: 2015-04-23 | Disposition: A | Discharge status: Home / Self Care | Payer: Self-pay | Attending: Emergency Medicine | Admitting: Emergency Medicine

## 2015-04-23 ENCOUNTER — Encounter (HOSPITAL_COMMUNITY): Payer: Self-pay | Admitting: Emergency Medicine

## 2015-04-23 DIAGNOSIS — F17210 Nicotine dependence, cigarettes, uncomplicated: Secondary | ICD-10-CM | POA: Insufficient documentation

## 2015-04-23 DIAGNOSIS — Z791 Long term (current) use of non-steroidal anti-inflammatories (NSAID): Secondary | ICD-10-CM | POA: Insufficient documentation

## 2015-04-23 DIAGNOSIS — F419 Anxiety disorder, unspecified: Secondary | ICD-10-CM | POA: Insufficient documentation

## 2015-04-23 DIAGNOSIS — Z87828 Personal history of other (healed) physical injury and trauma: Secondary | ICD-10-CM | POA: Insufficient documentation

## 2015-04-23 DIAGNOSIS — Z4802 Encounter for removal of sutures: Secondary | ICD-10-CM | POA: Insufficient documentation

## 2015-04-23 DIAGNOSIS — Z79899 Other long term (current) drug therapy: Secondary | ICD-10-CM | POA: Insufficient documentation

## 2015-04-23 DIAGNOSIS — F329 Major depressive disorder, single episode, unspecified: Secondary | ICD-10-CM | POA: Insufficient documentation

## 2015-04-23 NOTE — Discharge Instructions (Signed)

## 2015-04-23 NOTE — ED Notes (Signed)
Pt states he is here to get the sutures taken out of his head  Pt has sutures noted in the left temporal area  Pt states they have been there a couple of weeks

## 2015-04-23 NOTE — ED Provider Notes (Signed)
CSN: [id number]     Arrival date & time 04/23/15  2147 History  By signing my name below, I, Doreatha Martin, attest that this documentation has been prepared under the direction and in the presence of  Earley Favor, NP. Electronically Signed: Doreatha Martin, ED Scribe. 04/23/2015. 10:56 PM.    Chief Complaint  Patient presents with  . Suture / Staple Removal   The history is provided by the patient. No language interpreter was used.   HPI Comments: Jake [name] is a 45 y.o. male who presents to the Emergency Department for suture removal. Pt was initially seen in the ED on 04/06/15 and had 3 sutures placed to his left preauricular face. Pt reports that the wound is healing well. Denies any pain or swelling. Denies fever, drainage from the area.   Past Medical History  Diagnosis Date  . ETOH abuse   . Seizures (HCC)   . Hip dislocation   . Depression   . Anxiety    History reviewed. No pertinent past surgical history. History reviewed. No pertinent family history. Social History  Substance Use Topics  . Smoking status: Current Every Day Smoker -- 2.00 packs/day    Types: Cigarettes  . Smokeless tobacco: Never Used  . Alcohol Use: Yes     Comment: daily    Review of Systems  Constitutional: Negative for fever.  Skin: Positive for wound ( well-healing).  All other systems reviewed and are negative.  Allergies  Review of patient's allergies indicates no known allergies.  Home Medications   Prior to Admission medications   Medication Sig Start Date End Date Taking? Authorizing Provider  hydrOXYzine (ATARAX/VISTARIL) 25 MG tablet Take 1 tablet (25 mg total) by mouth every 6 (six) hours as needed for anxiety. 04/13/15   Sanjuana Kava, NP  meloxicam (MOBIC) 7.5 MG tablet Take 1 tablet (7.5 mg total) by mouth 2 (two) times daily. For arthritis pain 04/13/15   Sanjuana Kava, NP  Multiple Vitamin (MULTIVITAMIN WITH MINERALS) TABS tablet Take 1 tablet by mouth daily. For low Vitamin  04/13/15   Sanjuana Kava, NP  nicotine (NICODERM CQ - DOSED IN MG/24 HOURS) 21 mg/24hr patch Place 1 patch (21 mg total) onto the skin daily. For Nicotine addiction 04/13/15   Sanjuana Kava, NP  traZODone (DESYREL) 100 MG tablet Take 1 tablet (100 mg total) by mouth at bedtime and may repeat dose one time if needed. For sleep 04/13/15   Sanjuana Kava, NP  zolpidem (AMBIEN) 10 MG tablet Take 1 tablet (10 mg total) by mouth at bedtime as needed for sleep. 04/13/15 05/13/15  Sanjuana Kava, NP   BP 102/70 mmHg  Pulse 76  Temp(Src) 98.2 F (36.8 C) (Oral)  Resp 18  SpO2 96% Physical Exam  Constitutional: He is oriented to person, place, and time. He appears well-developed and well-nourished.  HENT:  Head: Normocephalic and atraumatic.  Well healing scar to left preauricular face. No sign of infection.   Eyes: Conjunctivae are normal.  Cardiovascular: Normal rate.   Pulmonary/Chest: Effort normal. No respiratory distress.  Abdominal: He exhibits no distension.  Musculoskeletal: Normal range of motion.  Neurological: He is alert and oriented to person, place, and time.  Skin: Skin is warm and dry.  Psychiatric: He has a normal mood and affect. His behavior is normal.  Nursing note and vitals reviewed.   ED Course  Procedures (including critical care time) DIAGNOSTIC STUDIES: Oxygen Saturation is 96% on RA, adequate by  my interpretation.    COORDINATION OF CARE: 10:51 PM Discussed treatment plan with pt at bedside which includes suture removal and pt agreed to plan.   SUTURE REMOVAL Performed by: Earley FavorGail Sumire Halbleib, NP  Authorized by: Laurence Spatesachel Morgan Little, MD Consent: Verbal consent obtained. Consent given by: patient Required items: required blood products, implants, devices, and special equipment available  Time out: Immediately prior to procedure a "time out" was called to verify the correct patient, procedure, equipment, support staff and site/side marked as required. Location: left  preauricular face Wound Appearance: clean, well-healed Sutures Removed: 3 Patient tolerance: Patient tolerated the procedure well with no immediate complications.    MDM   Final diagnoses:  Visit for suture removal    Staple removal   Pt to ER for staple/suture removal and wound check as above. Procedure tolerated well. Vitals normal, no signs of infection. Scar minimization & return precautions given at dc.    I personally performed the services described in this documentation, which was scribed in my presence. The recorded information has been reviewed and is accurate.  Earley FavorGail Aldina Porta, NP 04/24/15 2318  Laurence Spatesachel Morgan Little, MD 04/25/15 [phone]

## 2015-05-07 NOTE — H&P (Signed)
Psychiatric [facility]-Observation Unit Assessment Adult  Patient Identification: Jake [name] MRN: [id number] Date of Evaluation: 04/06/2015 Chief Complaint: "I plan on jumping in front of a train if I can't get help."  Principal Diagnosis: Alcohol dependence with alcohol-induced mood disorder (HCC) Diagnosis:  Patient Active Problem List   Diagnosis Date Noted  . 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 Polysubstance abuse [F19.10] 03/09/2014  . Opioid dependence (HCC) [F11.20] 03/09/2014  . Alcohol use disorder, severe, dependence (HCC) [F10.20] 03/08/2014  . Substance induced mood disorder (HCC) [F19.94] 03/08/2014  . Alcohol abuse [F10.10] 06/28/2013  . Alcohol withdrawal (HCC) [F10.239] 06/28/2013  . DTs (delirium tremens) (HCC) [F10.231] 06/28/2013  . Marijuana abuse [F12.10] 04/28/2011  . Alcoholism (HCC) [F10.20] 04/27/2011  . Toxic encephalopathy [G92] 04/27/2011  . Psychosis [F29] 04/27/2011  . Leukocytosis [D72.829] 04/27/2011  . Hyponatremia [E87.1] 04/27/2011  . Thrombocytopenia (HCC) [D69.6] 04/27/2011  . Elevated AST (SGOT) [R74.0] 04/27/2011  . Elevated bilirubin [R17] 04/27/2011   History of Present Illness::   Jake [name] is an 45 y.o. male presenting to [facility] reporting suicidal ideations with a plan to jump in front of the [employer] train. He reports severe depressive symptoms that appear to be related to persistent alcohol abuse. Patient states "I have been drinking beer or liquor for at least a year. I left [facility] last year and went to [facility]. I was sober for about a month. I feel very depressed. I don't have a steady job and now I am living in a tent. I have no stability. I have been drinking a whole lot. I am very shaky, sweaty, and having tremors right now." The patient continues to endorse symptoms of depression and of acute alcohol withdrawal. His alcohol level on  arrival to the ED was 367. Patient is currently on an ativan taper and his vitals are stable. He reports a history of seizures but currently appears stable while receiving medical treatment with the ativan taper. Patient would like to explore what treatment options are available to him. His liver enzymes are elevated which suggest chronic alcohol abuse.   Associated Signs/Symptoms: Depression Symptoms: depressed mood, anhedonia, insomnia, suicidal thoughts with specific plan, anxiety, loss of energy/fatigue, disturbed sleep, (Hypo) Manic Symptoms: Irritable Mood, Anxiety Symptoms: Excessive Worry, Psychotic Symptoms: Denies PTSD Symptoms: NA Total Time spent with patient: 45 minutes  Past Psychiatric History: Alcohol abuse, Heroin abuse in remission   Is the patient at risk to self? Yes.   Has the patient been a risk to self in the past 6 months? Yes.   Has the patient been a risk to self within the distant past? Yes.   Is the patient a risk to others? No.  Has the patient been a risk to others in the past 6 months? No.  Has the patient been a risk to others within the distant past? No.   Prior Inpatient Therapy: Prior Inpatient Therapy: Yes Prior Therapy Dates: 2016 Prior Therapy Facilty/Provider(s): [facility], [facility], [facility], [facility]  Reason for Treatment: Substance abuse tx.  Prior Outpatient Therapy: Prior Outpatient Therapy: No Does patient have an ACCT team?: No Does patient have Intensive In-House Services? : No Does patient have [facility] services? : No Does patient have P4CC services?: No  Alcohol Screening:   Substance Abuse History in the last 12 months: Yes.  Consequences of Substance Abuse: Withdrawal Symptoms: Cramps Diaphoresis Headaches Tremors Previous Psychotropic Medications: Yes  Psychological Evaluations: No  Past Medical History:  Past Medical  History  Diagnosis Date  . ETOH abuse   . Seizures ([place])   . Hip  dislocation   . Depression   . Anxiety    History reviewed. No pertinent past surgical history. Family History: No family history on file. Family Psychiatric History: Father with alcohol abuse Tobacco Screening: Negative  Social History:  History  Alcohol Use  . Yes   History  Drug Use  . Yes  . Special: Marijuana, Cocaine    Comment: herion, narcotics, benzo's, stimulants    Additional Social History: Marital status: Single   Pain Medications: Oxycotin, Roxy's,Diluadid Prescriptions: Xananx, Oxycotin, Roxy's, Diluadid  History of alcohol / drug use?: Yes Longest period of sobriety (when/how long): 6 months  Negative Consequences of Use: Work / Youth worker, Charity fundraiser relationships, Museum/gallery curator Name of Substance 1: Alcohol 1 - Age of First Use: childhod  1 - Amount (size/oz): 2-40oz 1 - Frequency: "as much as I can"  1 - Duration: 25 years 1 - Last Use / Amount: 04-05-15 BAL-367 Name of Substance 2: Heroin  2 - Age of First Use: 37 2 - Amount (size/oz): 1/2 gram  2 - Frequency: "whenever I can"  2 - Duration: ongoing  2 - Last Use / Amount: 03-30-15                Allergies: No Known Allergies Lab Results:   Lab Results Last 48 Hours    Results for orders placed or performed during the hospital encounter of 04/06/15 (from the past 48 hour(s))  Urine rapid drug screen (hosp performed) (Not at [facility]) Status: None   Collection Time: 04/06/15 2:29 AM  Result Value Ref Range   Opiates NONE DETECTED NONE DETECTED   Cocaine NONE DETECTED NONE DETECTED   Benzodiazepines NONE DETECTED NONE DETECTED   Amphetamines NONE DETECTED NONE DETECTED   Tetrahydrocannabinol NONE DETECTED NONE DETECTED   Barbiturates NONE DETECTED NONE DETECTED    Comment:   DRUG SCREEN FOR MEDICAL PURPOSES ONLY. IF CONFIRMATION IS NEEDED FOR ANY PURPOSE, NOTIFY LAB WITHIN 5 DAYS.   LOWEST DETECTABLE LIMITS FOR  URINE DRUG SCREEN Drug Class Cutoff (ng/mL) Amphetamine 1000 Barbiturate 200 Benzodiazepine 696 Tricyclics 295 Opiates 284 Cocaine 300 THC 50   Comprehensive metabolic panel Status: Abnormal   Collection Time: 04/06/15 2:36 AM  Result Value Ref Range   Sodium 142 135 - 145 mmol/L   Potassium 3.7 3.5 - 5.1 mmol/L   Chloride 106 101 - 111 mmol/L   CO2 26 22 - 32 mmol/L   Glucose, Bld 106 (H) 65 - 99 mg/dL   BUN 5 (L) 6 - 20 mg/dL   Creatinine, Ser 0.71 0.61 - 1.24 mg/dL   Calcium 8.6 (L) 8.9 - 10.3 mg/dL   Total Protein 7.3 6.5 - 8.1 g/dL   Albumin 4.0 3.5 - 5.0 g/dL   AST 173 (H) 15 - 41 U/L   ALT 160 (H) 17 - 63 U/L   Alkaline Phosphatase 102 38 - 126 U/L   Total Bilirubin 0.3 0.3 - 1.2 mg/dL   GFR calc non Af Amer >60 >60 mL/min   GFR calc Af Amer >60 >60 mL/min    Comment: (NOTE) The eGFR has been calculated using the CKD EPI equation. This calculation has not been validated in all clinical situations. eGFR's persistently <60 mL/min signify possible Chronic Kidney Disease.    Anion gap 10 5 - 15  Ethanol (ETOH) Status: Abnormal   Collection Time: 04/06/15 2:36 AM  Result Value Ref Range  Alcohol, Ethyl (B) 367 (HH) <5 mg/dL    Comment:   LOWEST DETECTABLE LIMIT FOR SERUM ALCOHOL IS 5 mg/dL FOR MEDICAL PURPOSES ONLY CRITICAL RESULT CALLED TO, READ BACK BY AND VERIFIED WITH: Jonne Ply, RN [phone] 04/06/15 BY A NORMAN   CBC Status: None   Collection Time: 04/06/15 2:36 AM  Result Value Ref Range   WBC 6.6 4.0 - 10.5 K/uL   RBC 4.89 4.22 - 5.81 MIL/uL   Hemoglobin 14.6 13.0 - 17.0 g/dL   HCT 43.1 39.0 - 52.0 %   MCV 88.1 78.0 - 100.0 fL   MCH 29.9 26.0 - 34.0 pg   MCHC 33.9 30.0 - 36.0 g/dL   RDW 13.6 11.5 - 15.5 %   Platelets 308 150 - 400 K/uL       Blood Alcohol level:   Recent Labs    Lab Results  Component Value Date   ETH 367* 04/06/2015   ETH 285* 87/56/4332      Metabolic Disorder Labs:   Recent Labs    No results found for: HGBA1C, MPG    Recent Labs    No results found for: PROLACTIN    Recent Labs    No results found for: CHOL, TRIG, HDL, CHOLHDL, VLDL, LDLCALC    Current Medications: Current Facility-Administered Medications  Medication Dose Route Frequency Provider Last Rate Last Dose  . lidocaine-EPINEPHrine (XYLOCAINE W/EPI) 2 %-1:200000 (PF) injection        . LORazepam (ATIVAN) tablet 0-4 mg 0-4 mg Oral 4 times per day Antonietta Breach, PA-C  1 mg at 04/06/15 1347   Followed by  . [START ON 04/08/2015] LORazepam (ATIVAN) tablet 0-4 mg 0-4 mg Oral Q12H Antonietta Breach, PA-C     Current Outpatient Prescriptions  Medication Sig Dispense Refill  . azithromycin (ZITHROMAX Z-PAK) 250 MG tablet 2 po day one, then 1 daily x 4 days (Patient not taking: Reported on 04/06/2015) 6 tablet 0  . chlordiazePOXIDE (LIBRIUM) 25 MG capsule '50mg'$  PO TID x 1D, then 25-'50mg'$  PO BID X 1D, then 25-'50mg'$  PO QD X 1D (Patient not taking: Reported on 04/01/2015) 10 capsule 0  . cyclobenzaprine (FLEXERIL) 10 MG tablet Take 1 tablet (10 mg total) by mouth 2 (two) times daily as needed for muscle spasms. (Patient not taking: Reported on 02/04/2015) 20 tablet 0  . meloxicam (MOBIC) 7.5 MG tablet Take 1 tablet (7.5 mg total) by mouth 2 (two) times daily. (Patient not taking: Reported on 02/04/2015) 60 tablet 0  . Multiple Vitamin (MULTIVITAMIN WITH MINERALS) TABS tablet Take 1 tablet by mouth daily. (Patient not taking: Reported on 02/04/2015)    . traZODone (DESYREL) 100 MG tablet Take 1 tablet (100 mg total) by mouth at bedtime and may repeat dose one time if needed. (Patient not taking: Reported on 02/04/2015) 60 tablet 0   PTA Medications:  (Not in a hospital  admission)  Musculoskeletal: Strength & Muscle Tone: within normal limits Gait & Station: normal Patient leans: N/A  Psychiatric Specialty Exam: Physical Exam  ROS  Blood pressure 137/94, pulse 88, temperature 98.4 F (36.9 C), temperature source Oral, resp. rate 20, height '6\' 1"'$  (1.854 m), weight 63.504 kg (140 lb), SpO2 100 %.Body mass index is 18.47 kg/(m^2).  General Appearance: Disheveled  Eye Sport and exercise psychologist:: Fair  Speech: Clear and Coherent  Volume: Normal  Mood: Dysphoric  Affect: Constricted  Thought Process: Goal Directed and Intact  Orientation: Full (Time, Place, and Person)  Thought Content: Symptoms, worries   Suicidal Thoughts: Yes.  with intent/plan  Homicidal Thoughts: No  Memory: Immediate; Good Recent; Good Remote; Good  Judgement: Fair  Insight: Present  Psychomotor Activity: Tremor  Concentration: Good  Recall: Good  Fund of Knowledge:Good  Language: Good  Akathisia: No  Handed: Right  AIMS (if indicated):    Assets: Communication Skills Desire for Improvement Leisure Time Physical Health Resilience Social Support  ADL's: Intact  Cognition: WNL  Sleep:       Treatment Plan Summary: Daily contact with patient to assess and evaluate symptoms and progress in treatment and Medication management   -Admit to the [place] Unit for further monitoring and evaluation.   Observation Level/Precautions: Continuous Observation  Laboratory: CBC Chemistry Profile UDS  Psychotherapy: Individual   Medications: Ativan to prevent seizures   Consultations: None  Discharge Concerns: Continued substance abuse   Estimated LOS: Less than 48 hours  Other: Counselor to explore treatment options for substance abuse     Jamion Carter, Mickel Baas, NP 3/24/20172:53 PM

## 2015-07-01 ENCOUNTER — Emergency Department (HOSPITAL_COMMUNITY)
Admission: EM | Admit: 2015-07-01 | Discharge: 2015-07-01 | Disposition: A | Discharge status: Home / Self Care | Payer: Self-pay | Attending: Emergency Medicine | Admitting: Emergency Medicine

## 2015-07-01 ENCOUNTER — Encounter (HOSPITAL_COMMUNITY): Payer: Self-pay | Admitting: Emergency Medicine

## 2015-07-01 DIAGNOSIS — F10920 Alcohol use, unspecified with intoxication, uncomplicated: Secondary | ICD-10-CM

## 2015-07-01 DIAGNOSIS — Z8669 Personal history of other diseases of the nervous system and sense organs: Secondary | ICD-10-CM | POA: Insufficient documentation

## 2015-07-01 DIAGNOSIS — F1510 Other stimulant abuse, uncomplicated: Secondary | ICD-10-CM | POA: Insufficient documentation

## 2015-07-01 DIAGNOSIS — F329 Major depressive disorder, single episode, unspecified: Secondary | ICD-10-CM | POA: Insufficient documentation

## 2015-07-01 DIAGNOSIS — F1210 Cannabis abuse, uncomplicated: Secondary | ICD-10-CM | POA: Insufficient documentation

## 2015-07-01 DIAGNOSIS — F1910 Other psychoactive substance abuse, uncomplicated: Secondary | ICD-10-CM | POA: Insufficient documentation

## 2015-07-01 DIAGNOSIS — F17210 Nicotine dependence, cigarettes, uncomplicated: Secondary | ICD-10-CM | POA: Insufficient documentation

## 2015-07-01 DIAGNOSIS — F1110 Opioid abuse, uncomplicated: Secondary | ICD-10-CM | POA: Insufficient documentation

## 2015-07-01 DIAGNOSIS — F10120 Alcohol abuse with intoxication, uncomplicated: Secondary | ICD-10-CM | POA: Insufficient documentation

## 2015-07-01 DIAGNOSIS — Z79899 Other long term (current) drug therapy: Secondary | ICD-10-CM | POA: Insufficient documentation

## 2015-07-01 DIAGNOSIS — F1410 Cocaine abuse, uncomplicated: Secondary | ICD-10-CM | POA: Insufficient documentation

## 2015-07-01 LAB — RAPID URINE DRUG SCREEN, HOSP PERFORMED
Amphetamines: NOT DETECTED
Barbiturates: NOT DETECTED
Benzodiazepines: NOT DETECTED
Cocaine: NOT DETECTED
Opiates: POSITIVE — AB
Tetrahydrocannabinol: POSITIVE — AB

## 2015-07-01 LAB — COMPREHENSIVE METABOLIC PANEL
ALT: 222 U/L — AB (ref 17–63)
ANION GAP: 13 (ref 5–15)
AST: 255 U/L — AB (ref 15–41)
Albumin: 4.3 g/dL (ref 3.5–5.0)
Alkaline Phosphatase: 135 U/L — ABNORMAL HIGH (ref 38–126)
BUN: 5 mg/dL — ABNORMAL LOW (ref 6–20)
CHLORIDE: 101 mmol/L (ref 101–111)
CO2: 24 mmol/L (ref 22–32)
CREATININE: 0.72 mg/dL (ref 0.61–1.24)
Calcium: 8.9 mg/dL (ref 8.9–10.3)
GFR calc non Af Amer: >60 mL/min (ref >60)
Glucose, Bld: 216 mg/dL — ABNORMAL HIGH (ref 65–99)
POTASSIUM: 3.2 mmol/L — AB (ref 3.5–5.1)
Sodium: 138 mmol/L (ref 135–145)
Total Bilirubin: 0.5 mg/dL (ref 0.3–1.2)
Total Protein: 7.9 g/dL (ref 6.5–8.1)

## 2015-07-01 LAB — CBC
HEMATOCRIT: 46.4 % (ref 39.0–52.0)
HEMOGLOBIN: 17 g/dL (ref 13.0–17.0)
MCH: 31.4 pg (ref 26.0–34.0)
MCHC: 36.6 g/dL — ABNORMAL HIGH (ref 30.0–36.0)
MCV: 85.6 fL (ref 78.0–100.0)
Platelets: 203 10*3/uL (ref 150–400)
RBC: 5.42 MIL/uL (ref 4.22–5.81)
RDW: 13.1 % (ref 11.5–15.5)
WBC: 9.4 10*3/uL (ref 4.0–10.5)

## 2015-07-01 LAB — ETHANOL: Alcohol, Ethyl (B): 436 mg/dL (ref <5)

## 2015-07-01 MED ORDER — PROMETHAZINE HCL 25 MG PO TABS: 25.0000 mg | ORAL_TABLET | Freq: Four times a day (QID) | ORAL | Status: DC | PRN

## 2015-07-01 MED ORDER — IBUPROFEN 800 MG PO TABS
800.0000 mg | ORAL_TABLET | Freq: Once | ORAL | Status: AC
  Administered 2015-07-01: 800 mg via ORAL
  Filled 2015-07-01: qty 1

## 2015-07-01 MED ORDER — LORAZEPAM 1 MG PO TABS
1.0000 mg | ORAL_TABLET | Freq: Once | ORAL | Status: AC
  Administered 2015-07-01: 1 mg via ORAL
  Filled 2015-07-01: qty 1

## 2015-07-01 NOTE — ED Provider Notes (Signed)
CSN: [id number]     Arrival date & time 07/01/15  0134 History   First MD Initiated Contact with Patient 07/01/15 0147     Chief Complaint  Patient presents with  . Medical Clearance     (Consider location/radiation/quality/duration/timing/severity/associated sxs/prior Treatment) HPI Comments: Patient is a 45 year old male with history of alcohol abuse with seizures secondary to alcohol withdrawal, anxiety, and depression. He presents to the emergency department requesting detox from alcohol. He reports that he has been drinking for 6 months. He reports, most recently, drinking alcohol approximately 12 hours ago. He states that he is withdrawing from alcohol and feels as though he is tremulous, though he has no outward signs of this. He is also complaining of diffuse myalgias from the withdrawals of heroin and cocaine. He states that he last used these illicit drugs 2 hours prior to arrival. He denies suicidal and homicidal ideations. No other complaints for this visit.  The history is provided by the patient. No language interpreter was used.    Past Medical History  Diagnosis Date  . ETOH abuse   . Seizures (HCC)   . Hip dislocation   . Depression   . Anxiety    History reviewed. No pertinent past surgical history. History reviewed. No pertinent family history. Social History  Substance Use Topics  . Smoking status: Current Every Day Smoker -- 2.00 packs/day    Types: Cigarettes  . Smokeless tobacco: Never Used  . Alcohol Use: Yes     Comment: daily    Review of Systems  Musculoskeletal: Positive for myalgias.  Neurological: Positive for tremors.  Psychiatric/Behavioral: Positive for behavioral problems. Negative for suicidal ideas.    Allergies  Review of patient's allergies indicates no known allergies.  Home Medications   Prior to Admission medications   Medication Sig Start Date End Date Taking? Authorizing Provider  hydrOXYzine (ATARAX/VISTARIL) 25 MG tablet Take  1 tablet (25 mg total) by mouth every 6 (six) hours as needed for anxiety. Patient not taking: Reported on 07/01/2015 04/13/15   Sanjuana Kava, NP  meloxicam (MOBIC) 7.5 MG tablet Take 1 tablet (7.5 mg total) by mouth 2 (two) times daily. For arthritis pain Patient not taking: Reported on 07/01/2015 04/13/15   Sanjuana Kava, NP  Multiple Vitamin (MULTIVITAMIN WITH MINERALS) TABS tablet Take 1 tablet by mouth daily. For low Vitamin Patient not taking: Reported on 07/01/2015 04/13/15   Sanjuana Kava, NP  nicotine (NICODERM CQ - DOSED IN MG/24 HOURS) 21 mg/24hr patch Place 1 patch (21 mg total) onto the skin daily. For Nicotine addiction Patient not taking: Reported on 07/01/2015 04/13/15   Sanjuana Kava, NP  promethazine (PHENERGAN) 25 MG tablet Take 1 tablet (25 mg total) by mouth every 6 (six) hours as needed for nausea or vomiting. 07/01/15   Antony Madura, PA-C  traZODone (DESYREL) 100 MG tablet Take 1 tablet (100 mg total) by mouth at bedtime and may repeat dose one time if needed. For sleep Patient not taking: Reported on 07/01/2015 04/13/15   Sanjuana Kava, NP  zolpidem (AMBIEN) 10 MG tablet Take 1 tablet (10 mg total) by mouth at bedtime as needed for sleep. Patient not taking: Reported on 07/01/2015 04/13/15 05/13/15  Sanjuana Kava, NP   BP 139/88 mmHg  Pulse 90  Temp(Src) 98 F (36.7 C) (Oral)  Resp 18  SpO2 95%   Physical Exam  Constitutional: He is oriented to person, place, and time. He appears well-developed and well-nourished. No distress.  HENT:  Head: Normocephalic and atraumatic.  Eyes: Conjunctivae and EOM are normal. No scleral icterus.  Neck: Normal range of motion.  Pulmonary/Chest: Effort normal. No respiratory distress.  Musculoskeletal: Normal range of motion.  Neurological: He is alert and oriented to person, place, and time. He exhibits normal muscle tone. Coordination normal.  Skin: Skin is warm and dry. No rash noted. He is not diaphoretic. No erythema. No pallor.   Psychiatric: He has a normal mood and affect. His behavior is normal. He expresses no homicidal and no suicidal ideation.  Nursing note and vitals reviewed.   ED Course  Procedures (including critical care time) Labs Review Labs Reviewed  COMPREHENSIVE METABOLIC PANEL - Abnormal; Notable for the following:    Potassium 3.2 (*)    Glucose, Bld 216 (*)    BUN 5 (*)    AST 255 (*)    ALT 222 (*)    Alkaline Phosphatase 135 (*)    All other components within normal limits  ETHANOL - Abnormal; Notable for the following:    Alcohol, Ethyl (B) 436 (*)    All other components within normal limits  CBC - Abnormal; Notable for the following:    MCHC 36.6 (*)    All other components within normal limits  URINE RAPID DRUG SCREEN, HOSP PERFORMED - Abnormal; Notable for the following:    Opiates POSITIVE (*)    Tetrahydrocannabinol POSITIVE (*)    All other components within normal limits    Imaging Review No results found.   I have personally reviewed and evaluated these images and lab results as part of my medical decision-making.   EKG Interpretation None      3:08 AM Patient presents requesting detox from alcohol and illicit substances. While he reports that his last drink was 12 hours prior to arrival, his BAC is 436. It is unlikely that the patient is withdrawing from alcohol at this time. He complains of myalgias due to the withdrawal from heroin and cocaine. The patient, however, is not positive for cocaine today. He is noted to be positive for opiates and marijuana. No outward signs of tremor. No diaphoresis. He denies suicidal and homicidal thoughts. Plan to discharge in the morning with outpatient resource guide.  MDM   Final diagnoses:  Polysubstance abuse  Alcohol intoxication, uncomplicated (HCC)    45 year old male presents to the emergency department requesting detox. He states that he is detoxing from alcohol, but his alcohol level is 436. He has no tachycardia or  tremors to suggest acute withdrawal. He is also positive for opioids and marijuana c/w polysubstance abuse. Patient denies SI/HI. He is hemodynamically stable and appropriate for outpatient management. Resource guide given for outpatient facilities with detox/rehab facilities. Patient discharged in satisfactory condition.   Filed Vitals:   07/01/15 0139 07/01/15 0323  BP: 138/119 139/88  Pulse: 92 90  Temp: 98 F (36.7 C)   TempSrc: Oral   Resp: 16 18  SpO2: 96% 95%     Antony MaduraKelly Catelin Manthe, PA-C 07/01/15 [id number]  Geoffery Lyonsouglas Delo, MD 07/01/15 [id number]

## 2015-07-01 NOTE — ED Notes (Addendum)
Patient states he has been drinking for 6 months and now wants detox. Patient also states he has been doing heroin and cocaine.

## 2015-07-01 NOTE — ED Notes (Addendum)
Pt. In burgundy scrubs. Pt. Has 1 belongings bag. Pt. Has 1 pr. caumoflage Boots, cigarettes and lighter, 1 blue shirt, 1 light green short, no cell phone and no money. Pt. Belongings locked up behind the nurses station in triage.

## 2015-07-01 NOTE — Discharge Instructions (Signed)
Polysubstance Abuse °When people abuse more than one drug or type of drug it is called polysubstance or polydrug abuse. For example, many smokers also drink alcohol. This is one form of polydrug abuse. Polydrug abuse also refers to the use of a drug to counteract an unpleasant effect produced by another drug. It may also be used to help with withdrawal from another drug. People who take stimulants may become agitated. Sometimes this agitation is countered with a tranquilizer. This helps protect against the unpleasant side effects. Polydrug abuse also refers to the use of different drugs at the same time.  °Anytime drug use is interfering with normal living activities, it has become abuse. This includes problems with family and friends. Psychological dependence has developed when your mind tells you that the drug is needed. This is usually followed by physical dependence which has developed when continuing increases of drug are required to get the same feeling or "high". This is known as addiction or chemical dependency. A person's risk is much higher if there is a history of chemical dependency in the family. °SIGNS OF CHEMICAL DEPENDENCY °· You have been told by friends or family that drugs have become a problem. °· You fight when using drugs. °· You are having blackouts (not remembering what you do while using). °· You feel sick from using drugs but continue using. °· You lie about use or amounts of drugs (chemicals) used. °· You need chemicals to get you going. °· You are suffering in work performance or in school because of drug use. °· You get sick from use of drugs but continue to use anyway. °· You need drugs to relate to people or feel comfortable in social situations. °· You use drugs to forget problems. °"Yes" answered to any of the above signs of chemical dependency indicates there are problems. The longer the use of drugs continues, the greater the problems will become. °If there is a family history of  drug or alcohol use, it is best not to experiment with these drugs. Continual use leads to tolerance. After tolerance develops more of the drug is needed to get the same feeling. This is followed by addiction. With addiction, drugs become the most important part of life. It becomes more important to take drugs than participate in the other usual activities of life. This includes relating to friends and family. Addiction is followed by dependency. Dependency is a condition where drugs are now needed not just to get high, but to feel normal. °Addiction cannot be cured but it can be stopped. This often requires outside help and the care of professionals. Treatment centers are listed in the yellow pages under: Cocaine, Narcotics, and Alcoholics Anonymous. Most hospitals and clinics can refer you to a specialized care center. Talk to your caregiver if you need help. °  °This information is not intended to replace advice given to you by your health care provider. Make sure you discuss any questions you have with your health care provider. °  °Document Released: 08/21/2004 Document Revised: 03/24/2011 Document Reviewed: 01/04/2014 °Elsevier Interactive Patient Education ©2016 Elsevier Inc. ° ° °Community Resource Guide Outpatient Counseling/Substance Abuse Adult °The United Way’s “211” is a great source of information about community services available.  Access by dialing 2-1-1 from anywhere in [place], or by website -  www.nc211.org.  ° °Other Local Resources (Updated 01/2015) ° °Crisis Hotlines °  °Services  ° °  °Area Served  °[facility] • Crisis Hotline, available 24 hours   a day, 7 days a week: 800-939-5911 [place], [place]  ° [facility] • Crisis Hotline, available 24 hours a day, 7 days a week: 866-275-9552 [place], [place]  °[facility] • Suicide Prevention Hotline, available 24 hours a day, 7 days a week: 800-273-8255 [place], [place]  °[facility] ° • Crisis  Hotline, available 24 hours a day, 7 days a week: 336-676-6840 [place], [place] °  °[facility] • Crisis Hotline, available 24 hours a day, 7 days a week: 800-256-2452 All °  °[facility] • Crisis Hotline, available 24 hours a day, 7 days a week: 877-626-1772 All  ° °Other Local Resources (Updated 01/2015) ° °Outpatient Counseling/ Substance Abuse Programs  °Services  ° °  °Address and Phone Number  °[facility] ° • Options include Individual counseling, group counseling, intensive outpatient program (several hours a day, several days a week) °• Offers depression assessments °• Provides methadone maintenance program 336-333-6860 °[address] °[place], [place] [address] °  °[facility] ° • Offers partial hospitalization/day treatment and DUI/DWI programs °• Accepts Medicare, private insurance 336-299-4655 °[address] °[place], [place] [address]  °[facility] ° ° • Services include intensive outpatient program (several hours a day, several days a week), outpatient treatment, DUI/DWI services, family education °• Also has some services specifically for Veterans °• Offers transitional housing  336-886-5594 °[address] °[place], [place] [address] °  °  °[facility] • Accepts Medicare, private pay, and private insurance 336-272-0855 °[address] °[place], [place] [address]  °[facility] • Services include individual counseling, substance abuse intensive outpatient program (several hours a day, several days a week), day treatment °• Accepts Medicare, Medicaid, private insurance 336-271-5888 °[address] °[place], [place] [address]  °[facility] ° • Offers substance abuse intensive outpatient program (several hours a day, several days a week), partial hospitalization program 336-832-9800 °[address] °[place], [place] [address] ° °336-349-4454 °[address] °[place], [place] [address] ° °336-386-3795 °[address] °[place], [place] [address] ° °336-993-6120 °[address] °[place], [place] [address]  °[facility] • Individual counseling only °• Accepts private insurance only 336-292-1510 °[address] °[place], [place] [address]  °[facility] • Methadone maintenance program 800-805-6989 °[address] °[place], [place] [address]  °[facility] • Walk-In Clinic providing substance abuse and mental health counseling °• Accepts Medicaid, Medicare, private insurance °• Offers sliding scale for uninsured 336-342-8316 °[address] °[place], [place]   °[facility] • Offers individual counseling, and intensive in-home services 336-347-7415 °[address] °[place], [place] [address]  °[facility] • Offers individual counseling, family counseling, group therapy, domestic violence counseling, consumer credit counseling °• Accepts Medicare, Medicaid, private insurance °• Offers sliding scale for uninsured 336-387-6161 °[address] °[place], [place] [address] ° °336-889-6161 °[facility] [place], [address] °[place], [place] [address]  °[facility] • Offers individual, family and group counseling °• 3 locations - [place], [place], and [place] ° 336-899-8800 ° °[address] °, [place] [address] ° °[address] °[place], [place] [address] ° °[address] °[place], [place] [address]  °[facility]  ° • Offers psychiatric assessment, 8-week Intensive Outpatient Program (several hours a day, several times a week, daytime or evenings), early recovery group, family Program, medication management °•   Private pay or private insurance only 336 -[phone], or  °800-659-3381 °[address] °[place], [place] [address]  °[facility] • Offers individual, couples and family counseling °• Accepts Medicaid,  private insurance, and sliding scale for uninsured 336-542-2076 °[address] °[place], [place] [address]  °David Fuller, MD • Individual counseling °• Private insurance 336-852-4051 °[address] °[place], [place] [address]  °[facility] ° • Offers assessment, substance abuse treatment, and behavioral health treatment 336-878-6098 °[address] °[place], [place] [address]  °[facility] • Individual counseling °• Accepts private insurance 336-272-1972 °[address] °[place], [place] [address]  °[facility] • Individual counseling °• Accepts Medicare, private insurance 336-547-1574 °[address] °[place], [place] [address]  °[facility]  ° • Offers intensive outpatient program (several hours a day, several times a week) °• Private pay, private insurance [phone] °[address] °[place], [place]  °[facility] • Individual counseling °• Medicare, private insurance 336-505-9494 °[address] °[place], [place] [address]  °[facility]  ° • Offers intensive outpatient program (several hours a day, several times a week) and partial hospitalization program 336-794-3550 °[address] °[place], [place] [address]  °Parrish McKinney, MD • Individual counseling 336-282-1251 °[address] °[place], [place] [address]  °[facility] • Offers Christian counseling to individuals, couples, and families °• Accepts Medicare and private insurance; offers sliding scale for uninsured 336-288-1484 °[address] °[place], [place] [address]  °[facility] • Christian counseling 336-542-2060 °[address] °[place], [place] [address]  °[facility] ° • Offers crisis counseling, individual counseling, group therapy, in-home therapy, domestic violence services, day treatment, DWI services, Community Support Team (CST),  Assertive Community Treatment Team (ACTT), substance abuse Intensive Outpatient Program (several hours a day, several times a week) °• 2 locations - [place] and [place] 336-229-5905 °[address] °[place], [place] [address] ° °336-694-1777 °[address] °[place], [place] [address]  °[facility]  ° ° • Individual counseling and group therapy °• Accepts private insurance, Medicare, Medicaid 336-379-7146 °[address] °[place], [place]  °[facility] • Offers individual and family counseling °• Offers LGBTQ services °• Accepts private insurance and private pay 336-288-9190 °[address] °[place], [place] [address]  °[facility]  ° • Offers individual counseling, group therapy, and outpatient detox °• Accepts private insurance [phone] °[address] °[place], [place]  °[facility] • Individual counseling °• Accepts Medicare, private insurance 336-632-3505 °[address] °[place], [place] [address]  °[facility] • Individual counseling °• Accepts Medicare, private insurance 336-570-0104 °[address] °[place], [place] [address]  °[facility] ° • Offers substance abuse Intensive Outpatient Program (several hours a day, several times a week) [phone], or °[phone] °,   ° °

## 2015-07-07 ENCOUNTER — Inpatient Hospital Stay (HOSPITAL_COMMUNITY)
Admission: EM | Admit: 2015-07-07 | Discharge: 2015-07-10 | DRG: 917 | Disposition: A | Discharge status: Home / Self Care | Payer: Self-pay | Attending: Internal Medicine | Admitting: Internal Medicine

## 2015-07-07 ENCOUNTER — Emergency Department (HOSPITAL_COMMUNITY): Payer: Self-pay

## 2015-07-07 ENCOUNTER — Other Ambulatory Visit: Payer: Self-pay

## 2015-07-07 ENCOUNTER — Encounter (HOSPITAL_COMMUNITY): Payer: Self-pay

## 2015-07-07 DIAGNOSIS — Z590 Homelessness: Secondary | ICD-10-CM

## 2015-07-07 DIAGNOSIS — R64 Cachexia: Secondary | ICD-10-CM | POA: Diagnosis present

## 2015-07-07 DIAGNOSIS — F11229 Opioid dependence with intoxication, unspecified: Secondary | ICD-10-CM

## 2015-07-07 DIAGNOSIS — R740 Nonspecific elevation of levels of transaminase and lactic acid dehydrogenase [LDH]: Secondary | ICD-10-CM | POA: Diagnosis present

## 2015-07-07 DIAGNOSIS — F10231 Alcohol dependence with withdrawal delirium: Secondary | ICD-10-CM | POA: Diagnosis present

## 2015-07-07 DIAGNOSIS — Z681 Body mass index (BMI) 19 or less, adult: Secondary | ICD-10-CM

## 2015-07-07 DIAGNOSIS — F10232 Alcohol dependence with withdrawal with perceptual disturbance: Secondary | ICD-10-CM

## 2015-07-07 DIAGNOSIS — Z8249 Family history of ischemic heart disease and other diseases of the circulatory system: Secondary | ICD-10-CM

## 2015-07-07 DIAGNOSIS — F10930 Alcohol use, unspecified with withdrawal, uncomplicated: Secondary | ICD-10-CM | POA: Diagnosis present

## 2015-07-07 DIAGNOSIS — R7401 Elevation of levels of liver transaminase levels: Secondary | ICD-10-CM

## 2015-07-07 DIAGNOSIS — F1010 Alcohol abuse, uncomplicated: Secondary | ICD-10-CM

## 2015-07-07 DIAGNOSIS — F1910 Other psychoactive substance abuse, uncomplicated: Secondary | ICD-10-CM | POA: Diagnosis present

## 2015-07-07 DIAGNOSIS — T401X1A Poisoning by heroin, accidental (unintentional), initial encounter: Principal | ICD-10-CM | POA: Diagnosis present

## 2015-07-07 DIAGNOSIS — F17210 Nicotine dependence, cigarettes, uncomplicated: Secondary | ICD-10-CM | POA: Diagnosis present

## 2015-07-07 DIAGNOSIS — E43 Unspecified severe protein-calorie malnutrition: Secondary | ICD-10-CM | POA: Diagnosis present

## 2015-07-07 DIAGNOSIS — F1020 Alcohol dependence, uncomplicated: Secondary | ICD-10-CM

## 2015-07-07 DIAGNOSIS — F329 Major depressive disorder, single episode, unspecified: Secondary | ICD-10-CM | POA: Diagnosis present

## 2015-07-07 DIAGNOSIS — F10230 Alcohol dependence with withdrawal, uncomplicated: Secondary | ICD-10-CM | POA: Diagnosis present

## 2015-07-07 DIAGNOSIS — Y907 Blood alcohol level of 200-239 mg/100 ml: Secondary | ICD-10-CM | POA: Diagnosis present

## 2015-07-07 DIAGNOSIS — E876 Hypokalemia: Secondary | ICD-10-CM | POA: Diagnosis present

## 2015-07-07 DIAGNOSIS — F419 Anxiety disorder, unspecified: Secondary | ICD-10-CM | POA: Diagnosis present

## 2015-07-07 DIAGNOSIS — R748 Abnormal levels of other serum enzymes: Secondary | ICD-10-CM | POA: Diagnosis present

## 2015-07-07 DIAGNOSIS — F1120 Opioid dependence, uncomplicated: Secondary | ICD-10-CM | POA: Diagnosis present

## 2015-07-07 LAB — COMPREHENSIVE METABOLIC PANEL
ALT: 211 U/L — AB (ref 17–63)
ANION GAP: 11 (ref 5–15)
AST: 372 U/L — ABNORMAL HIGH (ref 15–41)
Albumin: 3.9 g/dL (ref 3.5–5.0)
Alkaline Phosphatase: 131 U/L — ABNORMAL HIGH (ref 38–126)
BUN: 7 mg/dL (ref 6–20)
CALCIUM: 8.2 mg/dL — AB (ref 8.9–10.3)
CHLORIDE: 99 mmol/L — AB (ref 101–111)
CO2: 25 mmol/L (ref 22–32)
CREATININE: 0.75 mg/dL (ref 0.61–1.24)
Glucose, Bld: 197 mg/dL — ABNORMAL HIGH (ref 65–99)
Potassium: 3.1 mmol/L — ABNORMAL LOW (ref 3.5–5.1)
Sodium: 135 mmol/L (ref 135–145)
Total Bilirubin: 1 mg/dL (ref 0.3–1.2)
Total Protein: 7.3 g/dL (ref 6.5–8.1)

## 2015-07-07 LAB — CBC WITH DIFFERENTIAL/PLATELET
BASOS PCT: 1 %
Basophils Absolute: 0.1 10*3/uL (ref 0.0–0.1)
EOS ABS: 0.1 10*3/uL (ref 0.0–0.7)
Eosinophils Relative: 2 %
HEMATOCRIT: 45.2 % (ref 39.0–52.0)
HEMOGLOBIN: 15.9 g/dL (ref 13.0–17.0)
LYMPHS ABS: 2.3 10*3/uL (ref 0.7–4.0)
Lymphocytes Relative: 35 %
MCH: 31.1 pg (ref 26.0–34.0)
MCHC: 35.2 g/dL (ref 30.0–36.0)
MCV: 88.3 fL (ref 78.0–100.0)
Monocytes Absolute: 1.3 10*3/uL — ABNORMAL HIGH (ref 0.1–1.0)
Monocytes Relative: 20 %
NEUTROS ABS: 2.7 10*3/uL (ref 1.7–7.7)
NEUTROS PCT: 42 %
Platelets: 141 10*3/uL — ABNORMAL LOW (ref 150–400)
RBC: 5.12 MIL/uL (ref 4.22–5.81)
RDW: 13.2 % (ref 11.5–15.5)
WBC: 6.4 10*3/uL (ref 4.0–10.5)

## 2015-07-07 LAB — URINALYSIS, ROUTINE W REFLEX MICROSCOPIC
Bilirubin Urine: NEGATIVE
GLUCOSE, UA: 100 mg/dL — AB
HGB URINE DIPSTICK: NEGATIVE
Ketones, ur: NEGATIVE mg/dL
Leukocytes, UA: NEGATIVE
Nitrite: NEGATIVE
PH: 5.5 (ref 5.0–8.0)
Protein, ur: NEGATIVE mg/dL
SPECIFIC GRAVITY, URINE: 1.014 (ref 1.005–1.030)

## 2015-07-07 LAB — ACETAMINOPHEN LEVEL

## 2015-07-07 LAB — RAPID URINE DRUG SCREEN, HOSP PERFORMED
AMPHETAMINES: NOT DETECTED
BARBITURATES: NOT DETECTED
Benzodiazepines: NOT DETECTED
COCAINE: NOT DETECTED
OPIATES: POSITIVE — AB
TETRAHYDROCANNABINOL: POSITIVE — AB

## 2015-07-07 LAB — PHOSPHORUS: Phosphorus: 3 mg/dL (ref 2.5–4.6)

## 2015-07-07 LAB — SALICYLATE LEVEL: Salicylate Lvl: <4 mg/dL (ref 2.8–30.0)

## 2015-07-07 LAB — ETHANOL: Alcohol, Ethyl (B): 236 mg/dL — ABNORMAL HIGH (ref <5)

## 2015-07-07 LAB — MAGNESIUM: MAGNESIUM: 1.5 mg/dL — AB (ref 1.7–2.4)

## 2015-07-07 MED ORDER — ONDANSETRON HCL 4 MG/2ML IJ SOLN: 4.0000 mg | Freq: Four times a day (QID) | INTRAMUSCULAR | Status: DC | PRN

## 2015-07-07 MED ORDER — CHLORDIAZEPOXIDE HCL 25 MG PO CAPS
25.0000 mg | ORAL_CAPSULE | Freq: Four times a day (QID) | ORAL | Status: DC | PRN
Start: 2015-07-07 — End: 2015-07-10
  Administered 2015-07-10: 25 mg via ORAL
  Filled 2015-07-07: qty 1

## 2015-07-07 MED ORDER — ADULT MULTIVITAMIN W/MINERALS CH
1.0000 | ORAL_TABLET | Freq: Every day | ORAL | Status: DC
  Administered 2015-07-07 – 2015-07-10 (×4): 1 via ORAL
  Filled 2015-07-07 (×4): qty 1

## 2015-07-07 MED ORDER — VITAMIN B-1 100 MG PO TABS
100.0000 mg | ORAL_TABLET | Freq: Every day | ORAL | Status: DC
  Administered 2015-07-08 – 2015-07-10 (×3): 100 mg via ORAL
  Filled 2015-07-07 (×3): qty 1

## 2015-07-07 MED ORDER — LOPERAMIDE HCL 2 MG PO CAPS: 2.0000 mg | ORAL_CAPSULE | ORAL | Status: DC | PRN

## 2015-07-07 MED ORDER — HEPARIN SODIUM (PORCINE) 5000 UNIT/ML IJ SOLN
5000.0000 [IU] | Freq: Three times a day (TID) | INTRAMUSCULAR | Status: DC
  Administered 2015-07-07 – 2015-07-10 (×8): 5000 [IU] via SUBCUTANEOUS
  Filled 2015-07-07 (×11): qty 1

## 2015-07-07 MED ORDER — MAGNESIUM SULFATE 2 GM/50ML IV SOLN
2.0000 g | Freq: Once | INTRAVENOUS | Status: AC
  Administered 2015-07-08: 2 g via INTRAVENOUS
  Filled 2015-07-07: qty 50

## 2015-07-07 MED ORDER — SODIUM CHLORIDE 0.9 % IV SOLN
INTRAVENOUS | Status: DC
  Administered 2015-07-07 – 2015-07-08 (×2): via INTRAVENOUS

## 2015-07-07 MED ORDER — LORAZEPAM 2 MG/ML IJ SOLN
1.0000 mg | Freq: Once | INTRAMUSCULAR | Status: AC
  Administered 2015-07-07: 1 mg via INTRAVENOUS
  Filled 2015-07-07: qty 1

## 2015-07-07 MED ORDER — POLYETHYLENE GLYCOL 3350 17 G PO PACK: 17.0000 g | PACK | Freq: Every day | ORAL | Status: DC | PRN

## 2015-07-07 MED ORDER — THIAMINE HCL 100 MG/ML IJ SOLN
100.0000 mg | Freq: Once | INTRAMUSCULAR | Status: AC
  Administered 2015-07-07: 100 mg via INTRAMUSCULAR
  Filled 2015-07-07: qty 1

## 2015-07-07 MED ORDER — SODIUM CHLORIDE 0.9 % IV SOLN
INTRAVENOUS | Status: DC
  Administered 2015-07-07: 12:00:00 via INTRAVENOUS

## 2015-07-07 MED ORDER — SODIUM CHLORIDE 0.9 % IV BOLUS (SEPSIS)
1000.0000 mL | Freq: Once | INTRAVENOUS | Status: AC
  Administered 2015-07-07: 1000 mL via INTRAVENOUS

## 2015-07-07 MED ORDER — ONDANSETRON HCL 4 MG PO TABS
4.0000 mg | ORAL_TABLET | Freq: Four times a day (QID) | ORAL | Status: DC | PRN
Start: 2015-07-07 — End: 2015-07-10

## 2015-07-07 MED ORDER — POTASSIUM CHLORIDE CRYS ER 20 MEQ PO TBCR
40.0000 meq | EXTENDED_RELEASE_TABLET | Freq: Two times a day (BID) | ORAL | Status: AC
  Administered 2015-07-07 – 2015-07-08 (×2): 40 meq via ORAL
  Filled 2015-07-07 (×2): qty 2

## 2015-07-07 MED ORDER — NICOTINE 21 MG/24HR TD PT24
21.0000 mg | MEDICATED_PATCH | Freq: Once | TRANSDERMAL | Status: AC
  Administered 2015-07-07: 21 mg via TRANSDERMAL
  Filled 2015-07-07: qty 1

## 2015-07-07 MED ORDER — ONDANSETRON 4 MG PO TBDP: 4.0000 mg | ORAL_TABLET | Freq: Four times a day (QID) | ORAL | Status: DC | PRN

## 2015-07-07 MED ORDER — NICOTINE 21 MG/24HR TD PT24: 21.0000 mg | MEDICATED_PATCH | Freq: Every day | TRANSDERMAL | Status: DC

## 2015-07-07 MED ORDER — CHLORDIAZEPOXIDE HCL 25 MG PO CAPS
25.0000 mg | ORAL_CAPSULE | Freq: Once | ORAL | Status: AC
  Administered 2015-07-07: 25 mg via ORAL
  Filled 2015-07-07: qty 1

## 2015-07-07 MED ORDER — HYDROXYZINE HCL 25 MG PO TABS
25.0000 mg | ORAL_TABLET | Freq: Four times a day (QID) | ORAL | Status: DC | PRN
  Administered 2015-07-07 – 2015-07-09 (×8): 25 mg via ORAL
  Filled 2015-07-07 (×8): qty 1

## 2015-07-07 NOTE — ED Notes (Addendum)
Report given to 5th floor nurse. Donetta Potts(Terresa, RN)

## 2015-07-07 NOTE — ED Notes (Signed)
He remains in no distress. 

## 2015-07-07 NOTE — H&P (Signed)
[facility] History and Physical  Jake [name] ZOX:[id number] DOB: 08/16/1970 DOA: 07/07/2015  Referring physician: None PCP: No PCP Per Patient   Chief Complaint: Alcohol withdrawals  HPI: Jake [name] is a 45 y.o. male with past medical history of polysubstance abuse, he relates he drinks alcohol until he passes out, does heroine cocaine and marijuana, he is also homeless comes in as he went to a friend's home to the shower he injected himself with heroin as his friend found him down on the floor unresponsive, he was given intranasal Narcan by our department and he came to himself. He relates that his last drink of alcohol was this morning, he relates he usually drinks until he passes out.   In the ED: Had been in the ED for several hours when he started developing an increase in his heart rate and blood pressure and started being tremulous, was given several doses of Ativan and his sats dropped so we are consulted for further evaluation   Review of Systems:  Constitutional:  No weight loss, night sweats, Fevers, chills, fatigue.  HEENT:  No headaches, Difficulty swallowing,Tooth/dental problems,Sore throat,  No sneezing, itching, ear ache, nasal congestion, post nasal drip,  Cardio-vascular:  No chest pain, Orthopnea, PND, swelling in lower extremities, anasarca, dizziness, palpitations  GI:  No heartburn, indigestion, abdominal pain, nausea, vomiting, diarrhea, change in bowel habits, loss of appetite  Resp:  No shortness of breath with exertion or at rest. No excess mucus, no productive cough, No non-productive cough, No coughing up of blood.No change in color of mucus.No wheezing.No chest wall deformity  Skin:  no rash or lesions.  GU:  no dysuria, change in color of urine, no urgency or frequency. No flank pain.  Musculoskeletal:  No joint pain or swelling. No decreased range of motion. No back pain.  Psych:  No change in mood or affect. No depression or  anxiety. No memory loss.   Past Medical History  Diagnosis Date  . ETOH abuse   . Seizures (HCC)   . Hip dislocation   . Depression   . Anxiety    History reviewed. No pertinent past surgical history. Social History:  reports that he has been smoking Cigarettes.  He has been smoking about 2.00 packs per day. He has never used smokeless tobacco. He reports that he drinks about 4.8 oz of alcohol per week. He reports that he uses illicit drugs (Marijuana, Cocaine, IV, Heroin, Oxycodone, and "Crack" cocaine).  No Known Allergies  Family History  Problem Relation Age of Onset  . Heart attack Father   His mom is alive he has not seen her in over 20 years.  Prior to Admission medications   Medication Sig Start Date End Date Taking? Authorizing Provider  hydrOXYzine (ATARAX/VISTARIL) 25 MG tablet Take 1 tablet (25 mg total) by mouth every 6 (six) hours as needed for anxiety. Patient not taking: Reported on 07/01/2015 04/13/15   Sanjuana KavaAgnes I Nwoko, NP  meloxicam (MOBIC) 7.5 MG tablet Take 1 tablet (7.5 mg total) by mouth 2 (two) times daily. For arthritis pain Patient not taking: Reported on 07/01/2015 04/13/15   Sanjuana KavaAgnes I Nwoko, NP  Multiple Vitamin (MULTIVITAMIN WITH MINERALS) TABS tablet Take 1 tablet by mouth daily. For low Vitamin Patient not taking: Reported on 07/01/2015 04/13/15   Sanjuana KavaAgnes I Nwoko, NP  nicotine (NICODERM CQ - DOSED IN MG/24 HOURS) 21 mg/24hr patch Place 1 patch (21 mg total) onto the skin daily. For Nicotine addiction Patient not  taking: Reported on 07/01/2015 04/13/15   Sanjuana KavaAgnes I Nwoko, NP  promethazine (PHENERGAN) 25 MG tablet Take 1 tablet (25 mg total) by mouth every 6 (six) hours as needed for nausea or vomiting. Patient not taking: Reported on 07/07/2015 07/01/15   Antony MaduraKelly Humes, PA-C  traZODone (DESYREL) 100 MG tablet Take 1 tablet (100 mg total) by mouth at bedtime and may repeat dose one time if needed. For sleep Patient not taking: Reported on 07/01/2015 04/13/15   Sanjuana KavaAgnes I Nwoko, NP     Physical Exam: Filed Vitals:   07/07/15 1528 07/07/15 1604 07/07/15 1700 07/07/15 1716  BP: 104/64 135/95 118/64 123/87  Pulse: 81 86 84 90  Temp:      TempSrc:      Resp:  19 14   Height:      Weight:      SpO2:  95% 81%     Wt Readings from Last 3 Encounters:  07/07/15 63.504 kg (140 lb)  04/10/15 59.875 kg (132 lb)  04/10/15 63.504 kg (140 lb)    General:  Appears calm and comfortable, He is tremulous, mild cachexia Eyes: PERRL, normal lids, irises & conjunctiva ENT: grossly normal hearing, lips & tongue Neck: no LAD, masses or thyromegaly Cardiovascular: RRR, no m/r/g. No LE edema. Telemetry: SR, no arrhythmias  Respiratory: CTA bilaterally, no w/r/r. Normal respiratory effort. Abdomen: soft, ntnd Skin: no rash or induration seen on limited exam Musculoskeletal: grossly normal tone BUE/BLE Psychiatric: grossly normal mood and affect, speech fluent and appropriate Neurologic: grossly non-focal.          Labs on Admission:  Basic Metabolic Panel:  Recent Labs Lab 07/01/15 0152 07/07/15 1035  NA 138 135  K 3.2* 3.1*  CL 101 99*  CO2 24 25  GLUCOSE 216* 197*  BUN 5* 7  CREATININE 0.72 0.75  CALCIUM 8.9 8.2*   Liver Function Tests:  Recent Labs Lab 07/01/15 0152 07/07/15 1035  AST 255* 372*  ALT 222* 211*  ALKPHOS 135* 131*  BILITOT 0.5 1.0  PROT 7.9 7.3  ALBUMIN 4.3 3.9   No results for input(s): LIPASE, AMYLASE in the last 168 hours. No results for input(s): AMMONIA in the last 168 hours. CBC:  Recent Labs Lab 07/01/15 0152 07/07/15 1035  WBC 9.4 6.4  NEUTROABS  --  2.7  HGB 17.0 15.9  HCT 46.4 45.2  MCV 85.6 88.3  PLT 203 141*   Cardiac Enzymes: No results for input(s): CKTOTAL, CKMB, CKMBINDEX, TROPONINI in the last 168 hours.  BNP (last 3 results) No results for input(s): BNP in the last 8760 hours.  ProBNP (last 3 results) No results for input(s): PROBNP in the last 8760 hours.  CBG: No results for input(s): GLUCAP in  the last 168 hours.  Radiological Exams on Admission: Dg Chest 2 View  07/07/2015  CLINICAL DATA:  Unresponsive patient. EXAM: CHEST  2 VIEW COMPARISON:  10/24/2013 FINDINGS: Hyperinflation of the lungs demonstrated by increased retrosternal air and flattening of the hemidiaphragms. Findings are suggestive for emphysema. In addition, there is a large amount of lucency in the left upper lobe. Small nodular densities in the mid lungs probably related to nipple shadows. There is no focal airspace disease or pulmonary edema. Heart and mediastinum are within normal limits. The trachea is midline. No acute bone abnormality. No large pleural effusions. IMPRESSION: Emphysematous changes. No acute chest findings. Probable bilateral nipple shadows. This could be confirmed with nipple markers. Electronically Signed   By: Meriel PicaAdam  Henn M.D.  On: 07/07/2015 10:44    EKG: Independently reviewed. Sinus rhythm normal axis no specific T-wave changes  Assessment/Plan Active Problems:   Alcohol withdrawal (HCC)   Alcohol use disorder, severe, dependence (HCC)   Opioid dependence (HCC)   Transaminitis   Hypokalemia  He relates he drinks until he passes out, when I went in the room he had received 2 doses of Ativan and he was still shaking in his artery was running between 90s to 100, his last alcohol level on 06/30/2015 was 400, today is 236 with active signs of withdrawal, I will go ahead and continue to monitor with CIWA , and use Librium for withdrawal, we'll give him thiamine and folate, replete his potassium check a magnesium level will go ahead and given magnesium IV and recheck it in the morning.  I will not use narcotics as he had a mild drop in his oxygen saturations after he was given Ativan, I would do to monitor him with continuous pulse ox, his UDS was positive for opiates and marijuana.  He has mild elevation in transaminases, which is split little bit more than 1.5-1 which points to her alcohol  abuse. I had Long discussion with him about all the drugs even conceiving he except he does have a problem and at least he doesn't give any excuses of why he is using it.  Overall ahead and hydrate him aggressively and his chloride is less than 100 expected his BUNs to drop as well as his creatinine with IV fluid hydration will give him a regular diet admitted to MedSurg have him on continuous monitor.  Chest x-ray shows signs of aspiration on physical exam his lungs are clear. He doesn't have a white count or fever.  His acetaminophen and salicylate levels are below the cut off.    Code Status: full DVT Prophylaxis:lovenox Family Communication: none Disposition Plan: inpatient for 4-6 days  Time spent: 70 min  Marinda Elk [facility] Pager [phone]

## 2015-07-07 NOTE — ED Notes (Signed)
Bed: RESA Expected date:  Expected time:  Means of arrival:  Comments: Heroin OD

## 2015-07-07 NOTE — ED Notes (Signed)
This homeless gentleman was at a friend's house this morning; and his friend found him to be unresponsive.  The friend phoned EMS and prior to EMS arrival fire dep't. Gave 1 mg of intranasal Narcan with resultant awakening.  He arrives here awake and in no distress.

## 2015-07-07 NOTE — Discharge Instructions (Signed)
Drug Overdose °Drug overdose happens when you take too much of a drug. An overdose can occur with illegal drugs, prescription drugs, or over-the-counter (OTC) drugs. °The effects of drug overdose can be mild, dangerous, or even deadly. °CAUSES °Drug overdose may be caused by: °· Taking too much of a drug on purpose. °· Taking too much of a drug by accident. °· An error made by a health care provider who prescribes a drug. °· An error made by a pharmacist who fills the prescription order. °Drugs that commonly cause overdose include: °· Mental health drugs. °· Pain medicines. °· Illegal drugs. °· OTC cough and cold medicines. °· Heart medicines. °· Seizure medicines. °RISK FACTORS °Drug overdose is more likely in: °· Children. They may be attracted to colorful pills. Because of children's small size, even a small amount of a drug can be dangerous. °· Elderly people. They may be taking many different drugs. Elderly people may have difficulty reading labels or remembering when they last took their medicine. °The risk of drug overdose is also higher for someone who: °· Takes illegal drugs. °· Takes a drug and drinks alcohol. °· Has a mental health condition. °SYMPTOMS °Signs and symptoms of drug overdose depend on the drug and the amount that was taken. Common danger signs include: °· Behavior changes. °· Sleepiness. °· Slowed breathing. °· Nausea and vomiting. °· Seizures. °· Changes in eye pupil size (very large or very small). °If there are signs of very low blood pressure from a drug overdose (shock), emergency treatment is required. These signs include: °· Cold and clammy skin. °· Pale skin. °· Blue lips. °· Very slow breathing. °· Extreme sleepiness. °· Loss of consciousness. °DIAGNOSIS °Drug overdose may be diagnosed based on your symptoms. It is important that you tell your health care provider: °· All of the drugs that you have taken. °· When you took the drugs. °· Whether you were drinking alcohol. °Your health  care provider will do a physical exam. This exam may include: °· Checking and monitoring your heart rate and rhythm, your temperature, and your blood pressure (vital signs). °· Checking your breathing and oxygen level. °You may also have tests, including:  °· Urine tests to check for drugs in your system. °· Blood tests to check for: °¨ Drugs in your system. °¨ Signs of an imbalance of your blood minerals (electrolytes). °¨ Liver damage. °¨ Kidney damage. °TREATMENT °Supporting your vital signs and your breathing is the first step in treating a drug overdose. Treatment may also include: °· Receiving fluids and electrolytes through an IV tube. °· Having a breathing tube (endotracheal tube) inserted in your airway to help you breathe. °· Having a tube passed through your nose and into your stomach (nasogastric tube) to wash out your stomach. °· Medicines. You may get medicines to: °¨ Make you vomit. °¨ Absorb any medicine that is left in your digestive system (activated charcoal). °¨ Block or reverse the effect of the drug that caused the overdose. °· Having your blood filtered through an artificial kidney machine (hemodialysis). You may need this if your overdose is severe or if you have kidney failure. °· Having ongoing counseling and mental health support if you intentionally overdosed or used an illegal drug. °HOME CARE INSTRUCTIONS °· Take medicines only as directed by your health care provider. Always ask your health care provider to discuss the possible side effects of any new drug that you start taking. °· Keep a list of all of the drugs   that you take, including over-the-counter medicines. Bring this list with you to all of your medical visits. °· Read the drug inserts that come with your medicines. °· Do not use illegal drugs. °· Do not drink alcohol when taking drugs. °· Store all medicines in safety containers that are out of the reach of children. °· Keep the phone number of your local poison control  center near your phone or on your cell phone. °· Get help if you are struggling with alcohol or drug use. °· Get help if you are struggling with depression or another mental health problem. °· Keep all follow-up visits as directed by your health care provider. This is important. °SEEK MEDICAL CARE IF: °· Your symptoms return. °· You develop any new signs or symptoms when you are taking medicines. °SEEK IMMEDIATE MEDICAL CARE IF: °· You think that you or someone else may have taken too much of a drug. The hotline of the National Poison Control Center is (800) 222-1222. °· You or someone else is having symptoms of a drug overdose. °· You have serious thoughts about hurting yourself or others. °· You have chest pain. °· You have difficulty breathing. °· You have a loss of consciousness. °Drug overdose is an emergency. Do not wait to see if the symptoms will go away. Get medical help right away. Call your local emergency services (911 in the [place] Do not drive yourself to the hospital. °  °This information is not intended to replace advice given to you by your health care provider. Make sure you discuss any questions you have with your health care provider. °  °Document Released: 05/16/2014 Document Reviewed: 05/16/2014 °Elsevier Interactive Patient Education ©2016 [employer] ° °

## 2015-07-07 NOTE — ED Provider Notes (Addendum)
CSN: [id number]     Arrival date & time 07/07/15  1010 History   First MD Initiated Contact with Patient 07/07/15 1014     Chief Complaint  Patient presents with  . Drug Problem   PT IS A 45 YO WM WHO PRESENTS WITH A HEROIN OD.  HE IS HOMELESS AND WENT TO A FRIEND'S HOUSE TO SHOWER.  THE FRIEND FOUND HIM UNRESPONSIVE AND CALLED EMS.  THE PT WAS GIVEN 1 MG OF INTRANASAL NARCAN BY THE FIRE DEPT.  HE IS NOW AWAKE AND ALERT AND IS IN NO DISTRESS.  PT WAS HERE ON 6/18 WITH ETOH ABUSE REQUESTING DETOX.  (Consider location/radiation/quality/duration/timing/severity/associated sxs/prior Treatment) Patient is a 45 y.o. male presenting with drug problem. The history is provided by the patient and the EMS personnel.  Drug Problem This is a recurrent problem. The current episode started less than 1 hour ago. The problem has been rapidly improving.    Past Medical History  Diagnosis Date  . ETOH abuse   . Seizures (HCC)   . Hip dislocation   . Depression   . Anxiety    No past surgical history on file. History reviewed. No pertinent family history. Social History  Substance Use Topics  . Smoking status: Current Every Day Smoker -- 2.00 packs/day    Types: Cigarettes  . Smokeless tobacco: Never Used  . Alcohol Use: Yes     Comment: daily    Review of Systems  All other systems reviewed and are negative.     Allergies  Review of patient's allergies indicates no known allergies.  Home Medications   Prior to Admission medications   Medication Sig Start Date End Date Taking? Authorizing Provider  hydrOXYzine (ATARAX/VISTARIL) 25 MG tablet Take 1 tablet (25 mg total) by mouth every 6 (six) hours as needed for anxiety. Patient not taking: Reported on 07/01/2015 04/13/15   Sanjuana KavaAgnes I Nwoko, NP  meloxicam (MOBIC) 7.5 MG tablet Take 1 tablet (7.5 mg total) by mouth 2 (two) times daily. For arthritis pain Patient not taking: Reported on 07/01/2015 04/13/15   Sanjuana KavaAgnes I Nwoko, NP  Multiple Vitamin  (MULTIVITAMIN WITH MINERALS) TABS tablet Take 1 tablet by mouth daily. For low Vitamin Patient not taking: Reported on 07/01/2015 04/13/15   Sanjuana KavaAgnes I Nwoko, NP  nicotine (NICODERM CQ - DOSED IN MG/24 HOURS) 21 mg/24hr patch Place 1 patch (21 mg total) onto the skin daily. For Nicotine addiction Patient not taking: Reported on 07/01/2015 04/13/15   Sanjuana KavaAgnes I Nwoko, NP  promethazine (PHENERGAN) 25 MG tablet Take 1 tablet (25 mg total) by mouth every 6 (six) hours as needed for nausea or vomiting. Patient not taking: Reported on 07/07/2015 07/01/15   Antony MaduraKelly Humes, PA-C  traZODone (DESYREL) 100 MG tablet Take 1 tablet (100 mg total) by mouth at bedtime and may repeat dose one time if needed. For sleep Patient not taking: Reported on 07/01/2015 04/13/15   Sanjuana KavaAgnes I Nwoko, NP   BP 104/64 mmHg  Pulse 81  Temp(Src) 98 F (36.7 C) (Oral)  Resp 21  Ht 6\' 2"  (1.88 m)  Wt 140 lb (63.504 kg)  BMI 17.97 kg/m2  SpO2 95% Physical Exam  Constitutional: He is oriented to person, place, and time. He appears well-developed and well-nourished.  HENT:  Head: Normocephalic and atraumatic.  Right Ear: External ear normal.  Left Ear: External ear normal.  Nose: Nose normal.  Mouth/Throat: Oropharynx is clear and moist.  Eyes: Conjunctivae and EOM are normal. Pupils are equal, round,  and reactive to light.  Neck: Normal range of motion. Neck supple.  Cardiovascular: Normal rate, regular rhythm, normal heart sounds and intact distal pulses.   Pulmonary/Chest: Effort normal and breath sounds normal.  Abdominal: Soft. Bowel sounds are normal.  Musculoskeletal: Normal range of motion.  Neurological: He is alert and oriented to person, place, and time.  Skin: Skin is warm and dry.  Psychiatric: He has a normal mood and affect. His behavior is normal. Judgment and thought content normal.  Nursing note and vitals reviewed.   ED Course  Procedures (including critical care time) Labs Review Labs Reviewed  ACETAMINOPHEN  LEVEL - Abnormal; Notable for the following:    Acetaminophen (Tylenol), Serum <10 (*)    All other components within normal limits  COMPREHENSIVE METABOLIC PANEL - Abnormal; Notable for the following:    Potassium 3.1 (*)    Chloride 99 (*)    Glucose, Bld 197 (*)    Calcium 8.2 (*)    AST 372 (*)    ALT 211 (*)    Alkaline Phosphatase 131 (*)    All other components within normal limits  CBC WITH DIFFERENTIAL/PLATELET - Abnormal; Notable for the following:    Platelets 141 (*)    Monocytes Absolute 1.3 (*)    All other components within normal limits  URINE RAPID DRUG SCREEN, HOSP PERFORMED - Abnormal; Notable for the following:    Opiates POSITIVE (*)    Tetrahydrocannabinol POSITIVE (*)    All other components within normal limits  ETHANOL - Abnormal; Notable for the following:    Alcohol, Ethyl (B) 236 (*)    All other components within normal limits  URINALYSIS, ROUTINE W REFLEX MICROSCOPIC (NOT [facility]) - Abnormal; Notable for the following:    Glucose, UA 100 (*)    All other components within normal limits  SALICYLATE LEVEL    Imaging Review Dg Chest 2 View  07/07/2015  CLINICAL DATA:  Unresponsive patient. EXAM: CHEST  2 VIEW COMPARISON:  10/24/2013 FINDINGS: Hyperinflation of the lungs demonstrated by increased retrosternal air and flattening of the hemidiaphragms. Findings are suggestive for emphysema. In addition, there is a large amount of lucency in the left upper lobe. Small nodular densities in the mid lungs probably related to nipple shadows. There is no focal airspace disease or pulmonary edema. Heart and mediastinum are within normal limits. The trachea is midline. No acute bone abnormality. No large pleural effusions. IMPRESSION: Emphysematous changes. No acute chest findings. Probable bilateral nipple shadows. This could be confirmed with nipple markers. Electronically Signed   By: Richarda OverlieAdam  Henn M.D.   On: 07/07/2015 10:44   I have personally reviewed and evaluated  these images and lab results as part of my medical decision-making.   EKG Interpretation None     EKG NOT COMING THROUGH ON MUSE.  HR 79.  NSR.  NO ST OR T WAVE CHANGES. MDM  PT OBSERVED FOR A NUMBER OF HOURS FOR EVAL OF THE HEROIN OD.  PT HAS DEVELOPED WITHDRAW SX WHILE HERE AND HAS RECEIVED 2 MG OF ATIVAN.  AFTER THE 2ND ATIVAN, O2 SATS DROPPED AND O2 SATS IN THE MID 80S OFF OXYGEN.  THE PT IS SWEATY AND NOW IN ACTIVE WITHDRAW FROM ALCOHOL.  I SPOKE WITH DR. FELIZ-ORTIZ WHO WILL ADMIT PT. Final diagnoses:  Heroin overdose, accidental or unintentional, initial encounter  Alcohol abuse      Jacalyn LefevreJulie Lauri Purdum, MD 07/07/15 1604  Jacalyn LefevreJulie Barbara Ahart, MD 07/07/15 1730

## 2015-07-07 NOTE — ED Notes (Signed)
Patient is aware we need a urine specimen, requisitions were accidentally clicked off earlier. Patient has consumed water and has been receiving fluids. Patient did attempt to give a specimen once now and once previously. Patient unable to go. Will reassess.

## 2015-07-07 NOTE — ED Notes (Signed)
Patient placed back on O2 (2 Liters).  When off O2, his O2 level drops below 87% but when on 2L he is 94%-97%

## 2015-07-08 DIAGNOSIS — F10231 Alcohol dependence with withdrawal delirium: Secondary | ICD-10-CM

## 2015-07-08 LAB — BASIC METABOLIC PANEL
ANION GAP: 7 (ref 5–15)
BUN: 6 mg/dL (ref 6–20)
CO2: 26 mmol/L (ref 22–32)
Calcium: 8.4 mg/dL — ABNORMAL LOW (ref 8.9–10.3)
Chloride: 102 mmol/L (ref 101–111)
Creatinine, Ser: 0.6 mg/dL — ABNORMAL LOW (ref 0.61–1.24)
GFR calc Af Amer: >60 mL/min (ref >60)
GLUCOSE: 90 mg/dL (ref 65–99)
POTASSIUM: 4.5 mmol/L (ref 3.5–5.1)
Sodium: 135 mmol/L (ref 135–145)

## 2015-07-08 MED ORDER — NICOTINE 21 MG/24HR TD PT24
21.0000 mg | MEDICATED_PATCH | Freq: Every day | TRANSDERMAL | Status: DC
Start: 2015-07-08 — End: 2015-07-10
  Administered 2015-07-08 – 2015-07-10 (×3): 21 mg via TRANSDERMAL
  Filled 2015-07-08 (×3): qty 1

## 2015-07-08 MED ORDER — MAGNESIUM OXIDE 400 (241.3 MG) MG PO TABS
400.0000 mg | ORAL_TABLET | Freq: Two times a day (BID) | ORAL | Status: AC
  Administered 2015-07-08 (×2): 400 mg via ORAL
  Filled 2015-07-08 (×2): qty 1

## 2015-07-08 NOTE — Progress Notes (Signed)
[facility] PROGRESS NOTE    Progress Note  Jake [name]  ZOX:[id number] DOB: January 03, 1971 DOA: 07/07/2015 PCP: No PCP Per Patient     Brief Narrative:   Jake [name] is an 45 y.o. male  with past medical history of polysubstance abuse, he relates he drinks alcohol until he passes out, does heroine cocaine and marijuana, he is also homeless comes in as he went to a friend's home to the shower he injected himself with heroin as his friend found him down on the floor unresponsive, he was given intranasal Narcan by our department and he came to himself. He relates that his last drink of alcohol was this morning, he relates he usually drinks until he passes out.  Assessment/Plan:   Alcohol withdrawal delirium, acute, mixed level of activity (HCC): His CIWA score continues to be mildly high, but improved compared to yesterday. Continue Librium protocol, and continue to monitor his saturations continuously. He said no further hallucinations visual or tactile  Alcohol withdrawal (HCC)/Alcohol use disorder, severe, dependence (HCC)/Opioid dependence (HCC) Counseling, I did explain to him that he will be discharged without any narcotics when ascending to go home.  Transaminitis Due to alcohol abuse will repeat in the morning.  Hypokalemia/hypomagnesemia Potassium improved with repletion will continue oral repletion of magnesium and recheck in the morning.     DVT prophylaxis: lovenox Family Communication:none Disposition Plan/Barrier to D/C: home in 2 days Code Status:     Code Status Orders        Start     Ordered   07/07/15 1838  Full code   Continuous     07/07/15 1837    Code Status History    Date Active Date Inactive Code Status Order ID Comments User Context   04/10/2015 12:10 PM 04/14/2015  8:36 AM Full Code [id number]  Earney Navy, NP Inpatient   04/10/2015  2:32 AM 04/10/2015 12:10 PM Full Code [id number]  Earley Favor, NP ED   04/06/2015 10:23 PM 04/09/2015   7:57 AM Full Code [id number]  Charm Rings, NP Inpatient   04/06/2015  5:36 AM 04/06/2015 10:23 PM Full Code [id number]  Antony Madura, PA-C ED   03/10/2014  8:29 PM 03/10/2014  8:29 PM Full Code [id number]  Earney Navy, NP Inpatient   03/10/2014  8:29 PM 03/14/2014 10:17 AM Full Code [id number]  Earney Navy, NP Inpatient   03/08/2014 11:49 PM 03/10/2014  8:29 PM Full Code [id number]  Kerry Hough, PA-C Inpatient   03/07/2014  3:35 PM 03/08/2014 11:49 PM Full Code [id number]  Renne Crigler, PA-C ED   06/27/2013  8:09 PM 06/28/2013  8:57 PM Full Code [id number]  Garlon Hatchet, PA-C ED   04/27/2011  5:47 PM 04/30/2011  5:36 PM Full Code [id number]  America Brown, RN Inpatient        IV Access:    Peripheral IV   Procedures and diagnostic studies:   Dg Chest 2 View  07/07/2015  CLINICAL DATA:  Unresponsive patient. EXAM: CHEST  2 VIEW COMPARISON:  10/24/2013 FINDINGS: Hyperinflation of the lungs demonstrated by increased retrosternal air and flattening of the hemidiaphragms. Findings are suggestive for emphysema. In addition, there is a large amount of lucency in the left upper lobe. Small nodular densities in the mid lungs probably related to nipple shadows. There is no focal airspace disease or pulmonary edema. Heart and mediastinum are within normal limits. The trachea is midline. No acute bone abnormality. No large pleural effusions.  IMPRESSION: Emphysematous changes. No acute chest findings. Probable bilateral nipple shadows. This could be confirmed with nipple markers. Electronically Signed   By: Richarda OverlieAdam  Henn M.D.   On: 07/07/2015 10:44     Medical Consultants:    None.  Anti-Infectives:    Subjective:    Jake [name] No complaints, he relates he still has tremors but much improved compared to yesterday.  Objective:    Filed Vitals:   07/07/15 1828 07/07/15 2120 07/08/15 0004 07/08/15 0533  BP: 137/79 141/89 135/81 137/80  Pulse: 80 91 74 68  Temp: 98.4 F (36.9 C) 98.3  F (36.8 C) 98.4 F (36.9 C) 97.9 F (36.6 C)  TempSrc: Oral Oral Oral Oral  Resp: 10 17 18 19   Height:      Weight:    61.598 kg (135 lb 12.8 oz)  SpO2: 97% 98% 98% 98%    Intake/Output Summary (Last 24 hours) at 07/08/15 0813 Last data filed at 07/08/15 0600  Gross per 24 hour  Intake   1440 ml  Output   1700 ml  Net   -260 ml   Filed Weights   07/07/15 1400 07/08/15 0533  Weight: 63.504 kg (140 lb) 61.598 kg (135 lb 12.8 oz)    Exam: General exam: In no acute distress.Cachectic  Respiratory system: Good air movement and clear to auscultation. Cardiovascular system: S1 & S2 heard, RRR Gastrointestinal system: Abdomen is nondistended, soft and nontender.  Central nervous system: Alert and oriented. No focal neurological deficits. Extremities: No pedal edema. Skin: No rashes, lesions or ulcers Psychiatry: Judgement and insight appear normal. Mood & affect appropriate.    Data Reviewed:    Labs: Basic Metabolic Panel:  Recent Labs Lab 07/07/15 1035 07/07/15 1908 07/08/15 0542  NA 135  --  135  K 3.1*  --  4.5  CL 99*  --  102  CO2 25  --  26  GLUCOSE 197*  --  90  BUN 7  --  6  CREATININE 0.75  --  0.60*  CALCIUM 8.2*  --  8.4*  MG  --  1.5*  --   PHOS  --  3.0  --    GFR Estimated Creatinine Clearance: 102.7 mL/min (by C-G formula based on Cr of 0.6). Liver Function Tests:  Recent Labs Lab 07/07/15 1035  AST 372*  ALT 211*  ALKPHOS 131*  BILITOT 1.0  PROT 7.3  ALBUMIN 3.9   No results for input(s): LIPASE, AMYLASE in the last 168 hours. No results for input(s): AMMONIA in the last 168 hours. Coagulation profile No results for input(s): INR, PROTIME in the last 168 hours.  CBC:  Recent Labs Lab 07/07/15 1035  WBC 6.4  NEUTROABS 2.7  HGB 15.9  HCT 45.2  MCV 88.3  PLT 141*   Cardiac Enzymes: No results for input(s): CKTOTAL, CKMB, CKMBINDEX, TROPONINI in the last 168 hours. BNP (last 3 results) No results for input(s): PROBNP in  the last 8760 hours. CBG: No results for input(s): GLUCAP in the last 168 hours. D-Dimer: No results for input(s): DDIMER in the last 72 hours. Hgb A1c: No results for input(s): HGBA1C in the last 72 hours. Lipid Profile: No results for input(s): CHOL, HDL, LDLCALC, TRIG, CHOLHDL, LDLDIRECT in the last 72 hours. Thyroid function studies: No results for input(s): TSH, T4TOTAL, T3FREE, THYROIDAB in the last 72 hours.  Invalid input(s): FREET3 Anemia work up: No results for input(s): VITAMINB12, FOLATE, FERRITIN, TIBC, IRON, RETICCTPCT in the last 72  hours. Sepsis Labs:  Recent Labs Lab 07/07/15 1035  WBC 6.4   Microbiology No results found for this or any previous visit (from the past 240 hour(s)).   Medications:   . heparin  5,000 Units Subcutaneous Q8H  . multivitamin with minerals  1 tablet Oral Daily  . nicotine  21 mg Transdermal Once  . potassium chloride  40 mEq Oral BID  . thiamine  100 mg Oral Daily   Continuous Infusions: . sodium chloride 100 mL/hr at 07/08/15 0552    Time spent: 25 min   LOS: 1 day   Jake [name], Jake [name]  [facility] Pager [phone]  *Please refer to [email or web address], password [email or web address] to get updated schedule on who will round on this patient, as hospitalists switch teams weekly. If 7PM-7AM, please contact night-coverage at [email or web address], password [email or web address] for any overnight needs.  07/08/2015, 8:13 AM

## 2015-07-09 LAB — HEPATIC FUNCTION PANEL
ALBUMIN: 3.8 g/dL (ref 3.5–5.0)
ALK PHOS: 137 U/L — AB (ref 38–126)
ALT: 186 U/L — ABNORMAL HIGH (ref 17–63)
AST: 276 U/L — AB (ref 15–41)
BILIRUBIN DIRECT: 0.4 mg/dL (ref 0.1–0.5)
BILIRUBIN TOTAL: 1.7 mg/dL — AB (ref 0.3–1.2)
Indirect Bilirubin: 1.3 mg/dL — ABNORMAL HIGH (ref 0.3–0.9)
Total Protein: 7.3 g/dL (ref 6.5–8.1)

## 2015-07-09 LAB — MAGNESIUM: Magnesium: 2 mg/dL (ref 1.7–2.4)

## 2015-07-09 NOTE — Clinical Documentation Improvement (Signed)
Hospitalist  Would you please help clarify medical condition related to clinical findings?   Document Severity - Severe(third degree), Moderate (second degree), Mild (first degree)  Other condition  Unable to clinically determine  Document any associated diagnoses/conditions - cachexia, homeless, BMI of 17   Supporting Information: :  Pt has BMI of 17. Alcohol level of 236  Please exercise your independent, professional judgment when responding. A specific answer is not anticipated or expected.   Thank Modesta MessingYou, Har[facility]un [facility] [employer] [place] [phone]

## 2015-07-09 NOTE — Progress Notes (Signed)
[facility] PROGRESS NOTE    Progress Note  Jake Lopenthony L Stille  ZOX:[id number] DOB: 10/22/1970 DOA: 07/07/2015 PCP: No PCP Per Patient     Brief Narrative:   Jake [name] is an 45 y.o. male  with past medical history of polysubstance abuse, he relates he drinks alcohol until he passes out, does heroine cocaine and marijuana, he is also homeless comes in as he went to a friend's home to the shower he injected himself with heroin as his friend found him down on the floor unresponsive, he was given intranasal Narcan by our department and he came to himself. He relates that his last drink of alcohol was this morning, he relates he usually drinks until he passes out.  Assessment/Plan:   Alcohol withdrawal delirium, acute, mixed level of activity (HCC): His CIWA score is improved compared to yesterday. Continue Librium protocol.  Alcohol withdrawal (HCC)/Alcohol use disorder, severe, dependence (HCC)/Opioid dependence (HCC) Counseling, I did explain to him that he will be discharged without any narcotics when ascending to go home.  Transaminitis: Due to alcohol abuse, improving.  Hypokalemia/hypomagnesemia: Potassium improved with repletion will continue oral repletion of magnesium and recheck in the morning.     DVT prophylaxis: lovenox Family Communication:none Disposition Plan/Barrier to D/C: home in am Code Status:     Code Status Orders        Start     Ordered   07/07/15 1838  Full code   Continuous     07/07/15 1837    Code Status History    Date Active Date Inactive Code Status Order ID Comments User Context   04/10/2015 12:10 PM 04/14/2015  8:36 AM Full Code [id number]  Earney NavyJosephine C Onuoha, NP Inpatient   04/10/2015  2:32 AM 04/10/2015 12:10 PM Full Code [id number]  Earley FavorGail Schulz, NP ED   04/06/2015 10:23 PM 04/09/2015  7:57 AM Full Code [id number]  Charm RingsJamison Y Lord, NP Inpatient   04/06/2015  5:36 AM 04/06/2015 10:23 PM Full Code [id number]  Antony MaduraKelly Humes, PA-C ED   03/10/2014   8:29 PM 03/10/2014  8:29 PM Full Code [id number]  Earney NavyJosephine C Onuoha, NP Inpatient   03/10/2014  8:29 PM 03/14/2014 10:17 AM Full Code [id number]  Earney NavyJosephine C Onuoha, NP Inpatient   03/08/2014 11:49 PM 03/10/2014  8:29 PM Full Code [id number]  Kerry HoughSpencer E Simon, PA-C Inpatient   03/07/2014  3:35 PM 03/08/2014 11:49 PM Full Code [id number]  Renne CriglerJoshua Geiple, PA-C ED   06/27/2013  8:09 PM 06/28/2013  8:57 PM Full Code [id number]  Garlon HatchetLisa M Sanders, PA-C ED   04/27/2011  5:47 PM 04/30/2011  5:36 PM Full Code [id number]  America Brownuby W Johnson, RN Inpatient        IV Access:    Peripheral IV   Procedures and diagnostic studies:   Dg Chest 2 View  07/07/2015  CLINICAL DATA:  Unresponsive patient. EXAM: CHEST  2 VIEW COMPARISON:  10/24/2013 FINDINGS: Hyperinflation of the lungs demonstrated by increased retrosternal air and flattening of the hemidiaphragms. Findings are suggestive for emphysema. In addition, there is a large amount of lucency in the left upper lobe. Small nodular densities in the mid lungs probably related to nipple shadows. There is no focal airspace disease or pulmonary edema. Heart and mediastinum are within normal limits. The trachea is midline. No acute bone abnormality. No large pleural effusions. IMPRESSION: Emphysematous changes. No acute chest findings. Probable bilateral nipple shadows. This could be confirmed with nipple markers. Electronically Signed   By: Madelaine BhatAdam  Lowella DandyHenn M.D.   On: 07/07/2015 10:44     Medical Consultants:    None.  Anti-Infectives:    Subjective:    Jake LopeAnthony L Leh No complains.  Objective:    Filed Vitals:   07/08/15 0533 07/08/15 1500 07/08/15 2257 07/09/15 0509  BP: 137/80 124/92 138/91 141/86  Pulse: 68 72 63 63  Temp: 97.9 F (36.6 C) 98.4 F (36.9 C) 98.5 F (36.9 C) 97.9 F (36.6 C)  TempSrc: Oral Oral Oral Oral  Resp: 19 20 18 19   Height:      Weight: 61.598 kg (135 lb 12.8 oz)   59.875 kg (132 lb)  SpO2: 98% 99% 99% 99%    Intake/Output Summary (Last  24 hours) at 07/09/15 0826 Last data filed at 07/09/15 0509  Gross per 24 hour  Intake   1080 ml  Output   3500 ml  Net  -2420 ml   Filed Weights   07/07/15 1400 07/08/15 0533 07/09/15 0509  Weight: 63.504 kg (140 lb) 61.598 kg (135 lb 12.8 oz) 59.875 kg (132 lb)    Exam: General exam: In no acute distress.Cachectic  Respiratory system: Good air movement and clear to auscultation. Cardiovascular system: S1 & S2 heard, RRR Gastrointestinal system: Abdomen is nondistended, soft and nontender.  Central nervous system: Alert and oriented. No focal neurological deficits. Extremities: No pedal edema. Skin: No rashes, lesions or ulcers Psychiatry: Judgement and insight appear normal. Mood & affect appropriate.    Data Reviewed:    Labs: Basic Metabolic Panel:  Recent Labs Lab 07/07/15 1035 07/07/15 1908 07/08/15 0542 07/09/15 0513  NA 135  --  135  --   K 3.1*  --  4.5  --   CL 99*  --  102  --   CO2 25  --  26  --   GLUCOSE 197*  --  90  --   BUN 7  --  6  --   CREATININE 0.75  --  0.60*  --   CALCIUM 8.2*  --  8.4*  --   MG  --  1.5*  --  2.0  PHOS  --  3.0  --   --    GFR Estimated Creatinine Clearance: 99.8 mL/min (by C-G formula based on Cr of 0.6). Liver Function Tests:  Recent Labs Lab 07/07/15 1035 07/09/15 0513  AST 372* 276*  ALT 211* 186*  ALKPHOS 131* 137*  BILITOT 1.0 1.7*  PROT 7.3 7.3  ALBUMIN 3.9 3.8   No results for input(s): LIPASE, AMYLASE in the last 168 hours. No results for input(s): AMMONIA in the last 168 hours. Coagulation profile No results for input(s): INR, PROTIME in the last 168 hours.  CBC:  Recent Labs Lab 07/07/15 1035  WBC 6.4  NEUTROABS 2.7  HGB 15.9  HCT 45.2  MCV 88.3  PLT 141*   Cardiac Enzymes: No results for input(s): CKTOTAL, CKMB, CKMBINDEX, TROPONINI in the last 168 hours. BNP (last 3 results) No results for input(s): PROBNP in the last 8760 hours. CBG: No results for input(s): GLUCAP in the last  168 hours. D-Dimer: No results for input(s): DDIMER in the last 72 hours. Hgb A1c: No results for input(s): HGBA1C in the last 72 hours. Lipid Profile: No results for input(s): CHOL, HDL, LDLCALC, TRIG, CHOLHDL, LDLDIRECT in the last 72 hours. Thyroid function studies: No results for input(s): TSH, T4TOTAL, T3FREE, THYROIDAB in the last 72 hours.  Invalid input(s): FREET3 Anemia work up: No results for input(s): VITAMINB12,  FOLATE, FERRITIN, TIBC, IRON, RETICCTPCT in the last 72 hours. Sepsis Labs:  Recent Labs Lab 07/07/15 1035  WBC 6.4   Microbiology No results found for this or any previous visit (from the past 240 hour(s)).   Medications:   . heparin  5,000 Units Subcutaneous Q8H  . multivitamin with minerals  1 tablet Oral Daily  . nicotine  21 mg Transdermal Daily  . thiamine  100 mg Oral Daily   Continuous Infusions:    Time spent: 15 min   LOS: 2 days   Marinda Elk  [facility] Pager [phone]  *Please refer to [email or web address], password [email or web address] to get updated schedule on who will round on this patient, as hospitalists switch teams weekly. If 7PM-7AM, please contact night-coverage at [email or web address], password [email or web address] for any overnight needs.  07/09/2015, 8:26 AM

## 2015-07-10 DIAGNOSIS — E43 Unspecified severe protein-calorie malnutrition: Secondary | ICD-10-CM

## 2015-07-10 LAB — HEPATIC FUNCTION PANEL
ALBUMIN: 3.6 g/dL (ref 3.5–5.0)
ALT: 240 U/L — ABNORMAL HIGH (ref 17–63)
AST: 298 U/L — ABNORMAL HIGH (ref 15–41)
Alkaline Phosphatase: 144 U/L — ABNORMAL HIGH (ref 38–126)
Bilirubin, Direct: 0.3 mg/dL (ref 0.1–0.5)
Indirect Bilirubin: 0.4 mg/dL (ref 0.3–0.9)
TOTAL PROTEIN: 7.2 g/dL (ref 6.5–8.1)
Total Bilirubin: 0.7 mg/dL (ref 0.3–1.2)

## 2015-07-10 NOTE — Discharge Summary (Signed)
Physician Discharge Summary  Jake [name] ZOX:[id number] DOB: 11/17/1970 DOA: 07/07/2015  PCP: No PCP Per Patient  Admit date: 07/07/2015 Discharge date: 07/10/2015  Time spent: 35 minutes  Recommendations for Outpatient Follow-up:  1. Follow with PCP as needed.   Discharge Diagnoses:  Active Problems:   Alcohol withdrawal (HCC)   Alcohol use disorder, severe, dependence (HCC)   Opioid dependence (HCC)   Transaminitis   Hypokalemia   Alcohol withdrawal delirium, acute, mixed level of activity (HCC)   Protein-calorie malnutrition, severe (HCC)   Discharge Condition: Stable  Diet recommendation: regular  Filed Weights   07/07/15 1400 07/08/15 0533 07/09/15 0509  Weight: 63.504 kg (140 lb) 61.598 kg (135 lb 12.8 oz) 59.875 kg (132 lb)    History of present illness:  45 y.o. male with past medical history of polysubstance abuse, he relates he drinks alcohol until he passes out, does heroine cocaine and marijuana, he is also homeless comes in as he went to a friend's home to the shower he injected himself with heroin as his friend found him down on the floor unresponsive, he was given intranasal Narcan by our department and he came to himself. He relates that his last drink of alcohol was this morning, he relates he usually drinks until he passes out.   Hospital Course:  Alcohol withdrawal with delirium with mixed level of activity/severe alcohol use dependence and. Dependence:: In the ED he desaturated after he was given Ativan, he was admitted to the hospital and continue Librium orally which she finishes tapered in a house. He had no further episodes of withdrawals, he was counseled extensively about his alcohol use.  Elevated liver enzymes: Likely slight likely due to alcohol abuse  Heroin abuse: Remained afebrile he is counseled about his polysubstance abuse.  Hypokalemia/hypomagnesemia: These were repleted.  Procedures:  CXR  Consultations:  none  Discharge  Exam: Filed Vitals:   07/09/15 2031 07/10/15 0431  BP: 124/79 141/95  Pulse: 64 63  Temp: 98.4 F (36.9 C) 97.9 F (36.6 C)  Resp: 20 20    General: A&O x3 Cardiovascular: RRR Respiratory: good air movement CTA B/L  Discharge Instructions   Discharge Instructions    Diet - low sodium heart healthy    Complete by:  As directed      Increase activity slowly    Complete by:  As directed           Current Discharge Medication List    CONTINUE these medications which have NOT CHANGED   Details  hydrOXYzine (ATARAX/VISTARIL) 25 MG tablet Take 1 tablet (25 mg total) by mouth every 6 (six) hours as needed for anxiety. Qty: 60 tablet, Refills: 0    Multiple Vitamin (MULTIVITAMIN WITH MINERALS) TABS tablet Take 1 tablet by mouth daily. For low Vitamin    promethazine (PHENERGAN) 25 MG tablet Take 1 tablet (25 mg total) by mouth every 6 (six) hours as needed for nausea or vomiting. Qty: 12 tablet, Refills: 0    traZODone (DESYREL) 100 MG tablet Take 1 tablet (100 mg total) by mouth at bedtime and may repeat dose one time if needed. For sleep Qty: 60 tablet, Refills: 0      STOP taking these medications     meloxicam (MOBIC) 7.5 MG tablet      nicotine (NICODERM CQ - DOSED IN MG/24 HOURS) 21 mg/24hr patch        No Known Allergies Follow-up Information    Follow up with No PCP Per Patient.  Specialty:  General Practice      Follow up with [facility]   Specialty:  Emergency Medicine   Why:  As needed   Contact information:   [address] [id number] [address] [place] [id number] [phone]       The results of significant diagnostics from this hospitalization (including imaging, microbiology, ancillary and laboratory) are listed below for reference.    Significant Diagnostic Studies: Dg Chest 2 View  07/07/2015  CLINICAL DATA:  Unresponsive patient. EXAM: CHEST  2 VIEW COMPARISON:  10/24/2013 FINDINGS:  Hyperinflation of the lungs demonstrated by increased retrosternal air and flattening of the hemidiaphragms. Findings are suggestive for emphysema. In addition, there is a large amount of lucency in the left upper lobe. Small nodular densities in the mid lungs probably related to nipple shadows. There is no focal airspace disease or pulmonary edema. Heart and mediastinum are within normal limits. The trachea is midline. No acute bone abnormality. No large pleural effusions. IMPRESSION: Emphysematous changes. No acute chest findings. Probable bilateral nipple shadows. This could be confirmed with nipple markers. Electronically Signed   By: Richarda OverlieAdam  Henn M.D.   On: 07/07/2015 10:44    Microbiology: No results found for this or any previous visit (from the past 240 hour(s)).   Labs: Basic Metabolic Panel:  Recent Labs Lab 07/07/15 1035 07/07/15 1908 07/08/15 0542 07/09/15 0513  NA 135  --  135  --   K 3.1*  --  4.5  --   CL 99*  --  102  --   CO2 25  --  26  --   GLUCOSE 197*  --  90  --   BUN 7  --  6  --   CREATININE 0.75  --  0.60*  --   CALCIUM 8.2*  --  8.4*  --   MG  --  1.5*  --  2.0  PHOS  --  3.0  --   --    Liver Function Tests:  Recent Labs Lab 07/07/15 1035 07/09/15 0513 07/10/15 0535  AST 372* 276* 298*  ALT 211* 186* 240*  ALKPHOS 131* 137* 144*  BILITOT 1.0 1.7* 0.7  PROT 7.3 7.3 7.2  ALBUMIN 3.9 3.8 3.6   No results for input(s): LIPASE, AMYLASE in the last 168 hours. No results for input(s): AMMONIA in the last 168 hours. CBC:  Recent Labs Lab 07/07/15 1035  WBC 6.4  NEUTROABS 2.7  HGB 15.9  HCT 45.2  MCV 88.3  PLT 141*   Cardiac Enzymes: No results for input(s): CKTOTAL, CKMB, CKMBINDEX, TROPONINI in the last 168 hours. BNP: BNP (last 3 results) No results for input(s): BNP in the last 8760 hours.  ProBNP (last 3 results) No results for input(s): PROBNP in the last 8760 hours.  CBG: No results for input(s): GLUCAP in the last 168  hours.     Signed:  Marinda ElkFELIZ ORTIZ, ABRAHAM MD.  [facility] 07/10/2015, 8:39 AM

## 2015-07-10 NOTE — Progress Notes (Signed)
Pt discharged from the floor with the assistance of nursing student. Homeless shelter resources and bus pass was provided to the pt. Discharge instructions were reviewed with the pt. No questions or concerns at this time.  Shaan Rhoads W Audrionna Lampton, RN

## 2015-07-10 NOTE — Progress Notes (Signed)
LCSWA provided patient with requested homeless shelter list and free food pantries list. Patient also given bus pass for transportation.   Vivi BarrackNicole Domenik Trice, Theresia MajorsLCSWA, MSW Clinical Social Worker 5E and Psychiatric Service Line [phone] 07/10/2015  10:55 AM

## 2016-01-25 ENCOUNTER — Inpatient Hospital Stay (HOSPITAL_COMMUNITY)
Admission: EM | Admit: 2016-01-25 | Discharge: 2016-01-27 | DRG: 897 | Disposition: A | Discharge status: Home / Self Care | Payer: Self-pay | Attending: Family Medicine | Admitting: Family Medicine

## 2016-01-25 ENCOUNTER — Encounter (HOSPITAL_COMMUNITY): Payer: Self-pay | Admitting: Emergency Medicine

## 2016-01-25 ENCOUNTER — Emergency Department (HOSPITAL_COMMUNITY): Payer: Self-pay

## 2016-01-25 DIAGNOSIS — F17210 Nicotine dependence, cigarettes, uncomplicated: Secondary | ICD-10-CM | POA: Diagnosis present

## 2016-01-25 DIAGNOSIS — F419 Anxiety disorder, unspecified: Secondary | ICD-10-CM | POA: Diagnosis present

## 2016-01-25 DIAGNOSIS — F10239 Alcohol dependence with withdrawal, unspecified: Secondary | ICD-10-CM

## 2016-01-25 DIAGNOSIS — Y900 Blood alcohol level of less than 20 mg/100 ml: Secondary | ICD-10-CM | POA: Diagnosis present

## 2016-01-25 DIAGNOSIS — F10230 Alcohol dependence with withdrawal, uncomplicated: Principal | ICD-10-CM | POA: Diagnosis present

## 2016-01-25 DIAGNOSIS — F1020 Alcohol dependence, uncomplicated: Secondary | ICD-10-CM | POA: Diagnosis present

## 2016-01-25 DIAGNOSIS — E878 Other disorders of electrolyte and fluid balance, not elsewhere classified: Secondary | ICD-10-CM | POA: Diagnosis present

## 2016-01-25 DIAGNOSIS — E860 Dehydration: Secondary | ICD-10-CM | POA: Diagnosis present

## 2016-01-25 DIAGNOSIS — I10 Essential (primary) hypertension: Secondary | ICD-10-CM | POA: Diagnosis present

## 2016-01-25 DIAGNOSIS — Z8249 Family history of ischemic heart disease and other diseases of the circulatory system: Secondary | ICD-10-CM

## 2016-01-25 DIAGNOSIS — D72829 Elevated white blood cell count, unspecified: Secondary | ICD-10-CM | POA: Diagnosis present

## 2016-01-25 DIAGNOSIS — E871 Hypo-osmolality and hyponatremia: Secondary | ICD-10-CM | POA: Diagnosis present

## 2016-01-25 DIAGNOSIS — F10930 Alcohol use, unspecified with withdrawal, uncomplicated: Secondary | ICD-10-CM | POA: Diagnosis present

## 2016-01-25 HISTORY — DX: Abnormal levels of other serum enzymes: R74.8

## 2016-01-25 LAB — ETHANOL

## 2016-01-25 LAB — I-STAT TROPONIN, ED: Troponin i, poc: 0.01 ng/mL (ref 0.00–0.08)

## 2016-01-25 LAB — URINALYSIS, ROUTINE W REFLEX MICROSCOPIC
BILIRUBIN URINE: NEGATIVE
Bacteria, UA: NONE SEEN
Glucose, UA: NEGATIVE mg/dL
Hgb urine dipstick: NEGATIVE
Ketones, ur: 20 mg/dL — AB
Leukocytes, UA: NEGATIVE
Nitrite: NEGATIVE
PH: 5 (ref 5.0–8.0)
Protein, ur: 30 mg/dL — AB
SPECIFIC GRAVITY, URINE: 1.019 (ref 1.005–1.030)
SQUAMOUS EPITHELIAL / LPF: NONE SEEN

## 2016-01-25 LAB — BASIC METABOLIC PANEL
Anion gap: 13 (ref 5–15)
BUN: 7 mg/dL (ref 6–20)
CHLORIDE: 97 mmol/L — AB (ref 101–111)
CO2: 24 mmol/L (ref 22–32)
CREATININE: 0.75 mg/dL (ref 0.61–1.24)
Calcium: 9.5 mg/dL (ref 8.9–10.3)
GFR calc Af Amer: >60 mL/min (ref >60)
GFR calc non Af Amer: >60 mL/min (ref >60)
GLUCOSE: 103 mg/dL — AB (ref 65–99)
POTASSIUM: 3.8 mmol/L (ref 3.5–5.1)
SODIUM: 134 mmol/L — AB (ref 135–145)

## 2016-01-25 LAB — RAPID URINE DRUG SCREEN, HOSP PERFORMED
AMPHETAMINES: NOT DETECTED
BARBITURATES: NOT DETECTED
BENZODIAZEPINES: NOT DETECTED
COCAINE: NOT DETECTED
OPIATES: NOT DETECTED
Tetrahydrocannabinol: POSITIVE — AB

## 2016-01-25 LAB — TSH: TSH: 2.378 u[IU]/mL (ref 0.350–4.500)

## 2016-01-25 LAB — CBC
HEMATOCRIT: 46.3 % (ref 39.0–52.0)
Hemoglobin: 16.5 g/dL (ref 13.0–17.0)
MCH: 31.2 pg (ref 26.0–34.0)
MCHC: 35.6 g/dL (ref 30.0–36.0)
MCV: 87.5 fL (ref 78.0–100.0)
PLATELETS: 256 10*3/uL (ref 150–400)
RBC: 5.29 MIL/uL (ref 4.22–5.81)
RDW: 12.8 % (ref 11.5–15.5)
WBC: 10.6 10*3/uL — AB (ref 4.0–10.5)

## 2016-01-25 LAB — CBG MONITORING, ED: Glucose-Capillary: 93 mg/dL (ref 65–99)

## 2016-01-25 MED ORDER — LORAZEPAM 2 MG/ML IJ SOLN: 0.0000 mg | Freq: Two times a day (BID) | INTRAMUSCULAR | Status: DC

## 2016-01-25 MED ORDER — FOLIC ACID 1 MG PO TABS
1.0000 mg | ORAL_TABLET | Freq: Every day | ORAL | Status: DC
  Administered 2016-01-26 – 2016-01-27 (×2): 1 mg via ORAL
  Filled 2016-01-25 (×2): qty 1

## 2016-01-25 MED ORDER — LORAZEPAM 2 MG/ML IJ SOLN
1.0000 mg | Freq: Once | INTRAMUSCULAR | Status: AC
  Administered 2016-01-25: 1 mg via INTRAVENOUS
  Filled 2016-01-25: qty 1

## 2016-01-25 MED ORDER — THIAMINE HCL 100 MG/ML IJ SOLN: 100.0000 mg | Freq: Every day | INTRAMUSCULAR | Status: DC

## 2016-01-25 MED ORDER — VITAMIN B-1 100 MG PO TABS
100.0000 mg | ORAL_TABLET | Freq: Every day | ORAL | Status: DC
  Administered 2016-01-26 – 2016-01-27 (×2): 100 mg via ORAL
  Filled 2016-01-25 (×2): qty 1

## 2016-01-25 MED ORDER — ENOXAPARIN SODIUM 40 MG/0.4ML ~~LOC~~ SOLN
40.0000 mg | SUBCUTANEOUS | Status: DC
  Administered 2016-01-26 – 2016-01-27 (×2): 40 mg via SUBCUTANEOUS
  Filled 2016-01-25 (×3): qty 0.4

## 2016-01-25 MED ORDER — LORAZEPAM 2 MG/ML IJ SOLN
0.0000 mg | Freq: Four times a day (QID) | INTRAMUSCULAR | Status: DC
  Administered 2016-01-25 – 2016-01-26 (×2): 2 mg via INTRAVENOUS
  Filled 2016-01-25 (×2): qty 1

## 2016-01-25 MED ORDER — ACETAMINOPHEN 650 MG RE SUPP: 650.0000 mg | Freq: Four times a day (QID) | RECTAL | Status: DC | PRN

## 2016-01-25 MED ORDER — LORAZEPAM 2 MG/ML IJ SOLN: 1.0000 mg | Freq: Four times a day (QID) | INTRAMUSCULAR | Status: DC | PRN

## 2016-01-25 MED ORDER — LORAZEPAM 1 MG PO TABS
1.0000 mg | ORAL_TABLET | Freq: Four times a day (QID) | ORAL | Status: DC | PRN
  Administered 2016-01-26 – 2016-01-27 (×4): 1 mg via ORAL
  Filled 2016-01-25 (×4): qty 1

## 2016-01-25 MED ORDER — ADULT MULTIVITAMIN W/MINERALS CH
1.0000 | ORAL_TABLET | Freq: Every day | ORAL | Status: DC
  Administered 2016-01-26 – 2016-01-27 (×2): 1 via ORAL
  Filled 2016-01-25 (×2): qty 1

## 2016-01-25 MED ORDER — ACETAMINOPHEN 325 MG PO TABS: 650.0000 mg | ORAL_TABLET | Freq: Four times a day (QID) | ORAL | Status: DC | PRN

## 2016-01-25 MED ORDER — THIAMINE HCL 100 MG/ML IJ SOLN
Freq: Once | INTRAVENOUS | Status: AC
  Administered 2016-01-25: 09:00:00 via INTRAVENOUS
  Filled 2016-01-25: qty 1000

## 2016-01-25 MED ORDER — BISACODYL 5 MG PO TBEC
5.0000 mg | DELAYED_RELEASE_TABLET | Freq: Every day | ORAL | Status: DC | PRN
  Filled 2016-01-25: qty 1

## 2016-01-25 MED ORDER — POLYETHYLENE GLYCOL 3350 17 G PO PACK
17.0000 g | PACK | Freq: Every day | ORAL | Status: DC | PRN
  Filled 2016-01-25: qty 1

## 2016-01-25 MED ORDER — FAMOTIDINE 20 MG PO TABS
20.0000 mg | ORAL_TABLET | Freq: Two times a day (BID) | ORAL | Status: DC
  Administered 2016-01-25 – 2016-01-27 (×5): 20 mg via ORAL
  Filled 2016-01-25 (×5): qty 1

## 2016-01-25 MED ORDER — FAMOTIDINE IN NACL 20-0.9 MG/50ML-% IV SOLN
20.0000 mg | Freq: Two times a day (BID) | INTRAVENOUS | Status: DC
  Filled 2016-01-25 (×2): qty 50

## 2016-01-25 MED ORDER — NICOTINE 21 MG/24HR TD PT24
21.0000 mg | MEDICATED_PATCH | Freq: Every day | TRANSDERMAL | Status: DC
  Administered 2016-01-25 – 2016-01-27 (×3): 21 mg via TRANSDERMAL
  Filled 2016-01-25 (×3): qty 1

## 2016-01-25 MED ORDER — SODIUM CHLORIDE 0.9 % IV SOLN
INTRAVENOUS | Status: AC
  Administered 2016-01-25: 09:00:00 via INTRAVENOUS

## 2016-01-25 NOTE — H&P (Signed)
History and Physical    Jake [name]:[id number] DOB: January 30, 1970 DOA: 01/25/2016  PCP: No PCP Per Patient   Patient coming from: Home  Chief Complaint: Alcohol problem   HPI: Jake [name] is a 46 y.o. male with medical history significant for alcoholism with history of withdrawal seizure now presenting to the emergency department with tremors, anxiety, nausea, and malaise. Patient reports struggling with alcoholism since the age of 71 with only transient periods of sobriety. He is unable to identify a reason for drinking. He denies any recent fevers or chills, cough or dyspnea, headaches, change in vision or hearing, or focal numbness or weakness. He endorses a remote history of polysubstance abuse, but denies any recent illicit drug use. He denies any suicidal or homicidal ideation, and denies any hallucinations. He endorses some lower chest and epigastric discomfort earlier in the day that was alleviated with belching and not worsened by activity. He reports drinking "as much as I can afford." States that his last drink was the morning of 01/24/2016. He reports a strong desire to achieve abstinence and hopes to get to a inpatient rehabilitation program for this.   ED Course: Upon arrival to the ED, patient is found to be afebrile, saturating adequately on room air, mildly tachycardic, and mildly hypertensive.EKG features a sinus tachycardia with rate 106 and chest x-rays notable for mild peribronchial thickening. Chemistry panel is notable for mild hyponatremia and hypochloremia and CBC features a mild leukocytosis to 10,600. Troponin is within the normal limits at 0.01. Patient was treated with 1 mg IV Ativan. He remains tremulous and mildly tachycardic in the ED, but in no apparent respiratory distress and with stable blood pressure. He will be observed on the medical/surgical unit for ongoing evaluation and management of acute alcohol withdrawal with history of withdrawal  seizures.  Review of Systems:  All other systems reviewed and apart from HPI, are negative.  Past Medical History:  Diagnosis Date  . Anxiety   . Depression   . Elevated liver enzymes   . ETOH abuse   . Hip dislocation   . Seizures (HCC)     History reviewed. No pertinent surgical history.   reports that he has been smoking Cigarettes.  He has been smoking about 2.00 packs per day. He has never used smokeless tobacco. He reports that he drinks about 4.8 oz of alcohol per week . He reports that he does not use drugs.  No Known Allergies  Family History  Problem Relation Age of Onset  . Heart attack Father      Prior to Admission medications   Medication Sig Start Date End Date Taking? Authorizing Provider  hydrOXYzine (ATARAX/VISTARIL) 25 MG tablet Take 1 tablet (25 mg total) by mouth every 6 (six) hours as needed for anxiety. Patient not taking: Reported on 07/01/2015 04/13/15   Sanjuana Kava, NP  Multiple Vitamin (MULTIVITAMIN WITH MINERALS) TABS tablet Take 1 tablet by mouth daily. For low Vitamin Patient not taking: Reported on 07/01/2015 04/13/15   Sanjuana Kava, NP  promethazine (PHENERGAN) 25 MG tablet Take 1 tablet (25 mg total) by mouth every 6 (six) hours as needed for nausea or vomiting. Patient not taking: Reported on 07/07/2015 07/01/15   Antony Madura, PA-C  traZODone (DESYREL) 100 MG tablet Take 1 tablet (100 mg total) by mouth at bedtime and may repeat dose one time if needed. For sleep Patient not taking: Reported on 07/01/2015 04/13/15   Sanjuana Kava, NP  Physical Exam: Vitals:   01/25/16 0213  BP: 162/99  Pulse: 100  Resp: 12  Temp: 98.9 F (37.2 C)  TempSrc: Oral  SpO2: 98%      Constitutional: No respiratory distress. Appears uncomfortable, tremulous. No pallor or diaphoresis.  Eyes: PERTLA, lids and conjunctivae normal ENMT: Mucous membranes are moist. Posterior pharynx clear of any exudate or lesions.   Neck: normal, supple, no masses, no  thyromegaly Respiratory: Faint rhonchi bilateral mid-lung zones. No wheezes. Normal respiratory effort. No accessory muscle use.  Cardiovascular: Rate ~100 and regular without appreciable murmur. No extremity edema. No significant JVD. Abdomen: No distension, no tenderness, no masses palpated. Bowel sounds normal.  Musculoskeletal: no clubbing / cyanosis. No joint deformity upper and lower extremities. Normal muscle tone.  Skin: no significant rashes, lesions, ulcers. Warm, dry, well-perfused. Neurologic: CN 2-12 grossly intact. Sensation intact, DTR normal. Strength 5/5 in all 4 limbs.  Psychiatric: Alert and oriented x 3. Anxious, tremulous.     Labs on Admission: I have personally reviewed following labs and imaging studies  CBC:  Recent Labs Lab 01/25/16 0212  WBC 10.6*  HGB 16.5  HCT 46.3  MCV 87.5  PLT 256   Basic Metabolic Panel:  Recent Labs Lab 01/25/16 0212  NA 134*  K 3.8  CL 97*  CO2 24  GLUCOSE 103*  BUN 7  CREATININE 0.75  CALCIUM 9.5   GFR: CrCl cannot be calculated (Unknown ideal weight.). Liver Function Tests: No results for input(s): AST, ALT, ALKPHOS, BILITOT, PROT, ALBUMIN in the last 168 hours. No results for input(s): LIPASE, AMYLASE in the last 168 hours. No results for input(s): AMMONIA in the last 168 hours. Coagulation Profile: No results for input(s): INR, PROTIME in the last 168 hours. Cardiac Enzymes: No results for input(s): CKTOTAL, CKMB, CKMBINDEX, TROPONINI in the last 168 hours. BNP (last 3 results) No results for input(s): PROBNP in the last 8760 hours. HbA1C: No results for input(s): HGBA1C in the last 72 hours. CBG: No results for input(s): GLUCAP in the last 168 hours. Lipid Profile: No results for input(s): CHOL, HDL, LDLCALC, TRIG, CHOLHDL, LDLDIRECT in the last 72 hours. Thyroid Function Tests: No results for input(s): TSH, T4TOTAL, FREET4, T3FREE, THYROIDAB in the last 72 hours. Anemia Panel: No results for input(s):  VITAMINB12, FOLATE, FERRITIN, TIBC, IRON, RETICCTPCT in the last 72 hours. Urine analysis:    Component Value Date/Time   COLORURINE YELLOW 07/07/2015 1502   APPEARANCEUR CLEAR 07/07/2015 1502   LABSPEC 1.014 07/07/2015 1502   PHURINE 5.5 07/07/2015 1502   GLUCOSEU 100 (A) 07/07/2015 1502   HGBUR NEGATIVE 07/07/2015 1502   BILIRUBINUR NEGATIVE 07/07/2015 1502   KETONESUR NEGATIVE 07/07/2015 1502   PROTEINUR NEGATIVE 07/07/2015 1502   UROBILINOGEN 4.0 (H) 10/24/2013 1236   NITRITE NEGATIVE 07/07/2015 1502   LEUKOCYTESUR NEGATIVE 07/07/2015 1502   Sepsis Labs: @LABRCNTIP (procalcitonin:4,lacticidven:4) )No results found for this or any previous visit (from the past 240 hour(s)).   Radiological Exams on Admission: Dg Chest 2 View  Result Date: 01/25/2016 CLINICAL DATA:  Acute onset of upper mid abdominal pain and cramping. Initial encounter. EXAM: CHEST  2 VIEW COMPARISON:  Chest radiograph performed 07/07/2015 FINDINGS: The lungs are well-aerated. Mild peribronchial thickening is noted. There is no evidence of focal opacification, pleural effusion or pneumothorax. A calcified granuloma is noted at the left midlung zone. The heart is normal in size; the mediastinal contour is within normal limits. No acute osseous abnormalities are seen. IMPRESSION: Mild peribronchial thickening noted.  Lungs otherwise grossly clear. Electronically Signed   By: Roanna Raider M.D.   On: 01/25/2016 02:58    EKG: Independently reviewed. Sinus tachycardia (rate 106)   Assessment/Plan  1. Alcoholism with acute withdrawal  - Pt reports alcohol dependence since his teens with hx of DT's and withdrawal seizure  - He endorses a strong desire for abstinence; given degree of dependency and hx of DT's and szr, he will likely need inpatient detox  - Reports last drink the am of 01/24/16  - EtOH level remains pending on admission  - He will be monitored with CIWA, given prn Ativan; supplemental vitamins and  minerals ordered; social work consultation requested    2. Hyponatremia  - Serum sodium is 134 on admission in setting of apparent dehydration  - Will give a banana bag, followed by NS, repeat chem panel in am    3. Leukocytosis  - Mild leukocytosis to 10,600 on admission with no fever  - Plan to culture if febrile, monitor for localizing sxs    4. Hypertension  - BP elevated in ED  - Withdrawal likely contributing  - Treat with prn hydralazine IVP's if hypertensive despite treatment of withdrawal sxs    DVT prophylaxis: sq Lovenox Code Status: Full  Family Communication: Discussed with patient Disposition Plan: Observe on med-surg Consults called: None Admission status: Observation    Briscoe Deutscher, MD [facility] Pager [phone]  If 7PM-7AM, please contact night-coverage [email or web address] Password [facility]  01/25/2016, 6:03 AM

## 2016-01-25 NOTE — ED Triage Notes (Signed)
Pt states he started having excruciating pain in the middle of his chest a couple hours ago  Pt states it felt like cramping  Pt states the pain is gone at this time  Pt [facility] [employer] [facility] [place] to be seen  Pt states walking did not make the pain return

## 2016-01-25 NOTE — Progress Notes (Signed)
CSW spoke with patient regarding outpatient/inpatient treatment. Patient states he has been drinking since 46 years old. Patient has had times of sobriety with longest period being 8 months. [facility]. CSW provided outpatient and inpatient treatment resources.   Stacy GardnerErin Rhea Kaelin, LCSWA Clinical Social Worker [phone]

## 2016-01-25 NOTE — ED Provider Notes (Signed)
[facility]-EMERGENCY DEPT Provider Note   CSN: [id number] Arrival date & time: 01/25/16  0205  History   Chief Complaint Chief Complaint  Patient presents with  . Chest Pain    HPI Jake [name] is a 46 y.o. male.  HPI  46 y.o. male with a hx of ETOH Abuse, presents to the Emergency Department today complaining of central chest pain several hours ago. Notes cramping sensation during that time and rated 10/10. After waiting in ED, pain dissipated and does not have pain currently. Pain does not return with exacerbation. No N/V. No diaphoresis. No ABD pain. Pt does endorse ETOH abuse. Last drink yesterday morning. States he has had DTs in past with alcohol withdrawal seizures. Pt wishes to quit, but cannot afford Detox. No other symptoms noted.   Past Medical History:  Diagnosis Date  . Anxiety   . Depression   . Elevated liver enzymes   . ETOH abuse   . Hip dislocation   . Seizures [facility](HCC)     Patient Active Problem List   Diagnosis Date Noted  . Protein-calorie malnutrition, severe (HCC) 07/10/2015  . Transaminitis 07/07/2015  . Hypokalemia 07/07/2015  . Alcohol withdrawal delirium, acute, mixed level of activity (HCC) 07/07/2015  . Severe recurrent major depression without psychotic features (HCC) 04/10/2015  . 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 Alcohol dependence with alcohol-induced mood disorder (HCC) 03/09/2014  . Polysubstance abuse 03/09/2014  . Opioid dependence (HCC) 03/09/2014  . Alcohol use disorder, severe, dependence (HCC) 03/08/2014  . Substance induced mood disorder (HCC) 03/08/2014  . Alcohol abuse 06/28/2013  . Alcohol withdrawal (HCC) 06/28/2013  . DTs (delirium tremens) (HCC) 06/28/2013  . Marijuana abuse 04/28/2011  . Alcoholism (HCC) 04/27/2011  . Toxic encephalopathy 04/27/2011  . Psychosis 04/27/2011  . Leukocytosis 04/27/2011  . Hyponatremia 04/27/2011  . Thrombocytopenia (HCC) 04/27/2011  . Elevated AST (SGOT) 04/27/2011  .  Elevated bilirubin 04/27/2011    History reviewed. No pertinent surgical history.     Home Medications    Prior to Admission medications   Medication Sig Start Date End Date Taking? Authorizing Provider  hydrOXYzine (ATARAX/VISTARIL) 25 MG tablet Take 1 tablet (25 mg total) by mouth every 6 (six) hours as needed for anxiety. Patient not taking: Reported on 07/01/2015 04/13/15   Sanjuana KavaAgnes I Nwoko, NP  Multiple Vitamin (MULTIVITAMIN WITH MINERALS) TABS tablet Take 1 tablet by mouth daily. For low Vitamin Patient not taking: Reported on 07/01/2015 04/13/15   Sanjuana KavaAgnes I Nwoko, NP  promethazine (PHENERGAN) 25 MG tablet Take 1 tablet (25 mg total) by mouth every 6 (six) hours as needed for nausea or vomiting. Patient not taking: Reported on 07/07/2015 07/01/15   Antony MaduraKelly Humes, PA-C  traZODone (DESYREL) 100 MG tablet Take 1 tablet (100 mg total) by mouth at bedtime and may repeat dose one time if needed. For sleep Patient not taking: Reported on 07/01/2015 04/13/15   Sanjuana KavaAgnes I Nwoko, NP    Family History Family History  Problem Relation Age of Onset  . Heart attack Father     Social History Social History  Substance Use Topics  . Smoking status: Current Every Day Smoker    Packs/day: 2.00    Types: Cigarettes  . Smokeless tobacco: Never Used  . Alcohol use 4.8 oz/week    3 Cans of beer, 5 Shots of liquor per week     Comment: daily     Allergies   Patient has no known allergies.   Review of Systems Review of  Systems ROS reviewed and all are negative for acute change except as noted in the HPI.  Physical Exam Updated Vital Signs BP 162/99   Pulse 100   Temp 98.9 F (37.2 C) (Oral)   Resp 12   SpO2 98%   Physical Exam  Constitutional: He is oriented to person, place, and time. Vital signs are normal. He appears well-developed and well-nourished.  Pt tremulous on exam  HENT:  Head: Normocephalic.  Right Ear: Hearing normal.  Left Ear: Hearing normal.  Eyes: Conjunctivae and EOM  are normal. Pupils are equal, round, and reactive to light.  Neck: Normal range of motion. Neck supple.  Cardiovascular: Regular rhythm, normal heart sounds and intact distal pulses.  Tachycardia present.   Pulmonary/Chest: Effort normal and breath sounds normal.  Abdominal: Soft. There is no tenderness.  Musculoskeletal: Normal range of motion.  Neurological: He is alert and oriented to person, place, and time.  Skin: Skin is warm and dry.  Psychiatric: He has a normal mood and affect. His speech is normal and behavior is normal. Thought content normal.  Nursing note and vitals reviewed.  ED Treatments / Results  Labs (all labs ordered are listed, but only abnormal results are displayed) Labs Reviewed  BASIC METABOLIC PANEL - Abnormal; Notable for the following:       Result Value   Sodium 134 (*)    Chloride 97 (*)    Glucose, Bld 103 (*)    All other components within normal limits  CBC - Abnormal; Notable for the following:    WBC 10.6 (*)    All other components within normal limits  I-STAT TROPOININ, ED  I-STAT TROPOININ, ED    EKG  EKG Interpretation  Date/Time:  Friday January 25 2016 02:12:43 EST Ventricular Rate:  106 PR Interval:    QRS Duration: 96 QT Interval:  329 QTC Calculation: 437 R Axis:   95 Text Interpretation:  Sinus tachycardia Biatrial enlargement Probable lateral infarct, old Baseline wander in lead(s) I III aVL Confirmed by DELO  MD, DOUGLAS ([id number]) on 01/25/2016 2:23:15 AM       Radiology Dg Chest 2 View  Result Date: 01/25/2016 CLINICAL DATA:  Acute onset of upper mid abdominal pain and cramping. Initial encounter. EXAM: CHEST  2 VIEW COMPARISON:  Chest radiograph performed 07/07/2015 FINDINGS: The lungs are well-aerated. Mild peribronchial thickening is noted. There is no evidence of focal opacification, pleural effusion or pneumothorax. A calcified granuloma is noted at the left midlung zone. The heart is normal in size; the mediastinal  contour is within normal limits. No acute osseous abnormalities are seen. IMPRESSION: Mild peribronchial thickening noted.  Lungs otherwise grossly clear. Electronically Signed   By: Roanna Raider M.D.   On: 01/25/2016 02:58    Procedures Procedures (including critical care time)  Medications Ordered in ED Medications  LORazepam (ATIVAN) injection 1 mg (not administered)     Initial Impression / Assessment and Plan / ED Course  I have reviewed the triage vital signs and the nursing notes.  Pertinent labs & imaging results that were available during my care of the patient were reviewed by me and considered in my medical decision making (see chart for details).  Clinical Course    Final Clinical Impressions(s) / ED Diagnoses  {I have reviewed and evaluated the relevant laboratory values. {I have reviewed and evaluated the relevant imaging studies. {I have interpreted the relevant EKG. {I have reviewed the relevant previous healthcare records.  {I obtained HPI from  historian. {Patient discussed with supervising physician.  ED Course:  Assessment: Pt is a 45yM with hx ETOH abuse who presents with chest pain with onset several hours ago. No hx same. No CP currently. Noted ETOH abuse with last intake yesterday morning. Hx DTs. HX ETOH seizures. Pt requesting Detox assistance.. On exam, pt in NAD. Nontoxic/nonseptic appearing. VS with tachycardia. Afebrile. Lungs CTA. Heart RRR. Abdomen nontender soft. Tremulous on exam. Labs unremarkable. Trop negative. CXR unremarkable. Given ativan in ED. Plan is to Admit to medicine due to high risk for DTs.  Disposition/Plan:  Admit Pt acknowledges and agrees with plan  Supervising Physician Geoffery Lyons, MD  Final diagnoses:  Alcohol withdrawal syndrome without complication [facility])    New Prescriptions New Prescriptions   No medications on file     Audry Pili, PA-C 01/25/16 0547    Geoffery Lyons, MD 01/25/16 [phone]

## 2016-01-25 NOTE — ED Notes (Signed)
Called unit to give report. Nurse will call back. 

## 2016-01-25 NOTE — Progress Notes (Signed)
Mr. Mayford Knifeurner is a 46yo gentleman with a remote history of polysubstance abuse and longstanding/current history of alcohol abuse and chronic alcoholism w/history of DT's in the setting of withdrawal who was admitted earlier this morning for inpatient detoxification from alcohol.   Patient seen and examined in the ED this morning. States last drink 40oz beer x2 around 11:00am on 01/24/2016. Lost his job last year, living in a friend's camper, walked to the hospital. Usually drinks "a lot," meaning 12-18 beers per day. He feels shaky and sweaty but this has been improved with ativan.   BP 141/83 (BP Location: Right Arm)   Pulse 79   Temp 98.3 F (36.8 C) (Oral)   Resp 20   Ht 6\' 1"  (1.854 m)   Wt 65.8 kg (145 lb)   SpO2 96%   BMI 19.13 kg/m   Unkempt pleasant male resting in no distress, mildly tremulous.  RRR, no murmur Lungs clear, nonlabored No agitation, denies anxiety. No focal deficits.  Agree with plan as outlined in H&P from this morning.  - Continue CIWA. Stable for med-surg once bed available.  - Added nicotine patch. - SW has been consulted for disposition advice. Pt hoping for [facility].   Hazeline Junkeryan Sloka Volante, MD 01/25/2016 10:14 AM

## 2016-01-25 NOTE — ED Notes (Signed)
Pt given a Coke and asking for a Nicotine.

## 2016-01-25 NOTE — ED Notes (Signed)
Pt given urinal and made aware of need for urine specimen 

## 2016-01-25 NOTE — Progress Notes (Signed)
PHARMACIST - PHYSICIAN COMMUNICATION  DR:  Jarvis NewcomerGrunz  CONCERNING: IV to Oral Route Change Policy  RECOMMENDATION: This patient is receiving Pepcid by the intravenous route.  Based on criteria approved by the Pharmacy and Therapeutics Committee, the intravenous medication(s) is/are being converted to the equivalent oral dose form(s).   DESCRIPTION: These criteria include:  The patient is eating (either orally or via tube) and/or has been taking other orally administered medications for a least 24 hours  The patient has no evidence of active gastrointestinal bleeding or impaired GI absorption (gastrectomy, short bowel, patient on TNA or NPO).   **Changed per RN request due to incompatibilities with banana bag.  Patient tolerating oral meds/food.  Indication not for bleed or allergic reaction.  If you have questions about this conversion, please contact the Pharmacy Department  []   [phone]( [phone] )  [facility] []   [phone]( [phone] )  [facility] []   [phone]( [phone] )  [facility] []   [phone]( [phone] )  [facility] [x]   [phone]( [phone] )  [facility]   Emily FilbertLilliston, Marilyn Wing WoodbourneMichelle, [facility] AllianceRPH 01/25/2016 9:59 AM

## 2016-01-26 DIAGNOSIS — F1020 Alcohol dependence, uncomplicated: Secondary | ICD-10-CM

## 2016-01-26 DIAGNOSIS — F10230 Alcohol dependence with withdrawal, uncomplicated: Principal | ICD-10-CM

## 2016-01-26 LAB — COMPREHENSIVE METABOLIC PANEL
ALBUMIN: 3.4 g/dL — AB (ref 3.5–5.0)
ALK PHOS: 73 U/L (ref 38–126)
ALT: 133 U/L — ABNORMAL HIGH (ref 17–63)
AST: 125 U/L — ABNORMAL HIGH (ref 15–41)
Anion gap: 7 (ref 5–15)
BILIRUBIN TOTAL: 1.7 mg/dL — AB (ref 0.3–1.2)
BUN: 13 mg/dL (ref 6–20)
CALCIUM: 8.6 mg/dL — AB (ref 8.9–10.3)
CO2: 24 mmol/L (ref 22–32)
Chloride: 106 mmol/L (ref 101–111)
Creatinine, Ser: 0.7 mg/dL (ref 0.61–1.24)
GFR calc Af Amer: >60 mL/min (ref >60)
GFR calc non Af Amer: >60 mL/min (ref >60)
GLUCOSE: 96 mg/dL (ref 65–99)
Potassium: 4 mmol/L (ref 3.5–5.1)
Sodium: 137 mmol/L (ref 135–145)
TOTAL PROTEIN: 5.9 g/dL — AB (ref 6.5–8.1)

## 2016-01-26 LAB — CBC
HCT: 41.9 % (ref 39.0–52.0)
HEMOGLOBIN: 14.6 g/dL (ref 13.0–17.0)
MCH: 30.6 pg (ref 26.0–34.0)
MCHC: 34.8 g/dL (ref 30.0–36.0)
MCV: 87.8 fL (ref 78.0–100.0)
Platelets: 194 10*3/uL (ref 150–400)
RBC: 4.77 MIL/uL (ref 4.22–5.81)
RDW: 12.6 % (ref 11.5–15.5)
WBC: 7.1 10*3/uL (ref 4.0–10.5)

## 2016-01-26 LAB — GLUCOSE, CAPILLARY: GLUCOSE-CAPILLARY: 105 mg/dL — AB (ref 65–99)

## 2016-01-26 MED ORDER — LORAZEPAM 1 MG PO TABS: 1.0000 mg | ORAL_TABLET | Freq: Four times a day (QID) | ORAL | Status: DC | PRN

## 2016-01-26 NOTE — Discharge Summary (Signed)
Physician Discharge Summary  Jake [name] ZOX:[id number] DOB: 10/29/1970 DOA: 01/25/2016  PCP: No PCP Per Patient  Admit date: 01/25/2016 Discharge date: 01/27/2016  Admitted From: Home Disposition: Home   Recommendations for Outpatient Follow-up:  1. Follow up with PCP in 1-2 weeks 2. Ongoing tobacco and alcohol cessation counseling. [facility].   Home Health: None Equipment/Devices: None Discharge Condition: Stable CODE STATUS: Full Diet recommendation: Regular  Brief/Interim Summary: Jake Nailernthony L Turneris a 46 y.o.malewith medical history significant for alcoholism with history of withdrawal seizure now presenting to the emergency department with tremors, anxiety, nausea, and malaise. Patient reports struggling with alcoholism since the age of 46 with only transient periods of sobriety. He endorsed a remote history of polysubstance abuse, but denies any recent illicit drug use. He denies any suicidal or homicidal ideation, and denies any hallucinations. States last drink 40oz beer x2 around 11:00am on 01/24/2016. Lost his job last year, living in a friend's camper, walked to the hospital. Usually drinks "a lot," meaning 12-18 beers per day. He reports a strong desire to achieve abstinence and hopes to get to a inpatient rehabilitation program for this.He has been observed with CIWA protocol on medical/surgical unit and remained stable. He is outside the window for DTs and generally improving. There is no indication for inpatient psychiatric care, thus he is discharged in stable condition with recommendation to follow up with recovery center as an outpatient.   Discharge Diagnoses:  Principal Problem:   Alcohol withdrawal syndrome without complication (HCC) Active Problems:   Leukocytosis   Hyponatremia   Alcohol use disorder, severe, dependence (HCC)  Alcoholism with acute withdrawal: EtOH neg on admission. CIWA scores 3, 5, 0, 12, 0, 0 overnight.  Received 2mg  IV ativan for CIWA of 12 on 1/12 and has since had decreasing CIWA scores which include zero x4 in the past 24 hours. Now ~72 hours out from last drink 1/11 at 11:00.  - Pt reports alcohol dependence since his teens with hx of DT's and withdrawal seizure. SW has provided resource for pt's preferred center[facility].  - Continue MVM, folate, thiamine.   Hyponatremia: Due to potomania, resolved.   Leukocytosis: Stress demargination and mild dehydration, resolved.    Hypertension BP initially elevated in ED thought to be due primarily to withdrawal as this has resolved.   Discharge Instructions Discharge Instructions    Discharge instructions    Complete by:  As directed    You were admitted for alcohol detoxification. This has been accomplished. You are outside the period of time that would be the worst of withdrawal symptoms. These will continue to improve over the next few days without intervention. You are stable for discharge with the following recommendations:  - Continue to abstain from alcohol.  - Call [facility] for ongoing care, per the information provided to you. - If you experience worsening of symptoms, seek medical care.     Allergies as of 01/27/2016   No Known Allergies     Medication List    TAKE these medications   hydrOXYzine 25 MG tablet Commonly known as:  ATARAX/VISTARIL Take 1 tablet (25 mg total) by mouth every 6 (six) hours as needed for anxiety.   multivitamin with minerals Tabs tablet Take 1 tablet by mouth daily. For low Vitamin   promethazine 25 MG tablet Commonly known as:  PHENERGAN Take 1 tablet (25 mg total) by mouth every 6 (six) hours as needed for nausea or vomiting.  traZODone 100 MG tablet Commonly known as:  DESYREL Take 1 tablet (100 mg total) by mouth at bedtime and may repeat dose one time if needed. For sleep      Follow-up Information    [facility] Follow up.   Contact  information: [address] [place] [address] [phone]        [facility] .   Contact information: [address] [place] [address] [phone]          No Known Allergies  Consultations:  None  Procedures/Studies: Dg Chest 2 View  Result Date: 01/25/2016 CLINICAL DATA:  Acute onset of upper mid abdominal pain and cramping. Initial encounter. EXAM: CHEST  2 VIEW COMPARISON:  Chest radiograph performed 07/07/2015 FINDINGS: The lungs are well-aerated. Mild peribronchial thickening is noted. There is no evidence of focal opacification, pleural effusion or pneumothorax. A calcified granuloma is noted at the left midlung zone. The heart is normal in size; the mediastinal contour is within normal limits. No acute osseous abnormalities are seen. IMPRESSION: Mild peribronchial thickening noted.  Lungs otherwise grossly clear. Electronically Signed   By: Roanna Raider M.D.   On: 01/25/2016 02:58    Subjective: Patient feels tremors have improved, overall feeling better. Received 2mg  of oral ativan in past 24 hours. CIWA scores were zero x2 overnight. W[facility] to [facility] after this.  Discharge Exam: Vitals:   01/26/16 2239 01/27/16 0509  BP: (!) 125/91 126/80  Pulse: 76 74  Resp: 16 17  Temp: 98.4 F (36.9 C) 97.9 F (36.6 C)   Vitals:   01/26/16 0530 01/26/16 1521 01/26/16 2239 01/27/16 0509  BP:  130/86 (!) 125/91 126/80  Pulse:  81 76 74  Resp:  18 16 17   Temp:  98.5 F (36.9 C) 98.4 F (36.9 C) 97.9 F (36.6 C)  TempSrc:  Oral Oral Oral  SpO2:  96% 97% 97%  Weight: 65.3 kg (144 lb)     Height:       General: Pt is alert, awake, not in acute distress Cardiovascular: RRR, S1/S2 +, no rubs, no gallops Respiratory: CTA bilaterally, no wheezing, no rhonchi Abdominal: Soft, NT, ND, bowel sounds + Extremities: no edema, no cyanosis Neuro: Alert, oriented, no hallucinations or delusions. Tremor very mild noticed only on palpation.  Affect is congruent with normal mood.  Labs: Basic Metabolic Panel:  Recent Labs Lab 01/25/16 0212 01/26/16 0630  NA 134* 137  K 3.8 4.0  CL 97* 106  CO2 24 24  GLUCOSE 103* 96  BUN 7 13  CREATININE 0.75 0.70  CALCIUM 9.5 8.6*   Liver Function Tests:  Recent Labs Lab 01/26/16 0630  AST 125*  ALT 133*  ALKPHOS 73  BILITOT 1.7*  PROT 5.9*  ALBUMIN 3.4*   CBC:  Recent Labs Lab 01/25/16 0212 01/26/16 0630  WBC 10.6* 7.1  HGB 16.5 14.6  HCT 46.3 41.9  MCV 87.5 87.8  PLT 256 194   CBG:  Recent Labs Lab 01/25/16 0825 01/26/16 0737  GLUCAP 93 105*   Thyroid function studies  Recent Labs  01/25/16 0643  TSH 2.378   Urinalysis    Component Value Date/Time   COLORURINE AMBER (A) 01/25/2016 0844   APPEARANCEUR CLEAR 01/25/2016 0844   LABSPEC 1.019 01/25/2016 0844   PHURINE 5.0 01/25/2016 0844   GLUCOSEU NEGATIVE 01/25/2016 0844   HGBUR NEGATIVE 01/25/2016 0844   BILIRUBINUR NEGATIVE 01/25/2016 0844   KETONESUR 20 (A) 01/25/2016 5784  PROTEINUR 30 (A) 01/25/2016 0844   UROBILINOGEN 4.0 (H) 10/24/2013 1236   NITRITE NEGATIVE 01/25/2016 0844   LEUKOCYTESUR NEGATIVE 01/25/2016 0844   Time coordinating discharge: Over 30 minutes  Hazeline Junker, MD  [facility] 01/27/2016, 10:44 AM Pager [phone]

## 2016-01-26 NOTE — Progress Notes (Signed)
PROGRESS NOTE  Jake [name]  ZOX:[id number] DOB: 02/12/1970 DOA: 01/25/2016 PCP: No PCP Per Patient   Brief Narrative: Jake [name] is a 46 y.o. male with medical history significant for alcoholism with history of withdrawal seizure now presenting to the emergency department with tremors, anxiety, nausea, and malaise. Patient reports struggling with alcoholism since the age of 46 with only transient periods of sobriety. He endorsed a remote history of polysubstance abuse, but denies any recent illicit drug use. He denies any suicidal or homicidal ideation, and denies any hallucinations. States last drink 40oz beer x2 around 11:00am on 01/24/2016. Lost his job last year, living in a friend's camper, walked to the hospital. Usually drinks "a lot," meaning 12-18 beers per day. He reports a strong desire to achieve abstinence and hopes to get to a inpatient rehabilitation program for this. He has been observed with CIWA protocol on medical/surgical unit and remained stable.   Assessment & Plan: Principal Problem:   Alcohol withdrawal syndrome without complication (HCC) Active Problems:   Leukocytosis   Hyponatremia   Alcohol use disorder, severe, dependence (HCC)  Alcoholism with acute withdrawal: CIWA scores 3, 5, 0, 12, 0, 0 overnight. Received 2mg  IV ativan for CIWA of 12, otherwise none in past 24 hours. Now ~48 hours out from last drink 1/11 at 11:00. Will observe an additional day and wean ativan to oral per CIWA protocol.  - Pt reports alcohol dependence since his teens with hx of DT's and withdrawal seizure  - EtOH neg on admission.  - Continue MVM, folate, thiamine.  - SW [facility].    Hyponatremia: Due to potomania, resolved.  - DC IVF  Leukocytosis: Stress demargination and mild dehydration, resolved.   - Plan to culture if febrile, monitor for localizing sxs    Hypertension  - BP elevated in ED  - Withdrawal likely contributing  - Treat  with prn hydralazine IVP's if hypertensive despite treatment of withdrawal sxs   DVT prophylaxis: Lovenox Code Status: Full Family Communication: None at bedside Disposition Plan: Observation   Consultants:   None  Procedures:   None  Antimicrobials:  None   Subjective: Pt feels somewhat shaky, but overall stable since yesterday. No seizures/chest pain/dyspnea.   Objective: Vitals:   01/25/16 1937 01/25/16 2036 01/26/16 0518 01/26/16 0530  BP: 138/91 (!) 142/97 (!) 133/94   Pulse: 79 70    Resp:  18 18   Temp:  98.6 F (37 C) 98.1 F (36.7 C)   TempSrc:  Oral Oral   SpO2:  95% 93%   Weight:    65.3 kg (144 lb)  Height:        Intake/Output Summary (Last 24 hours) at 01/26/16 1209 Last data filed at 01/26/16 0730  Gross per 24 hour  Intake             2240 ml  Output                0 ml  Net             2240 ml   Filed Weights   01/25/16 0842 01/26/16 0530  Weight: 65.8 kg (145 lb) 65.3 kg (144 lb)    Examination: General exam: 46 y.o. male in no distress Respiratory system: Non-labored breathing room air. Clear to auscultation bilaterally.  Cardiovascular system: Regular rate and rhythm. No murmur, rub, or gallop. No JVD, and no pedal edema. Gastrointestinal system: Abdomen soft, non-tender, non-distended, with normoactive  bowel sounds. No organomegaly or masses felt. Central nervous system: Alert and oriented. No focal neurological deficits. Extremities: Warm, no deformities Skin: No rashes, lesions ulcers Psychiatry: Judgement and insight appear normal. Mood & affect appropriate. No agitation, delusions, or hallucinations.   Data Reviewed: I have personally reviewed following labs and imaging studies  CBC:  Recent Labs Lab 01/25/16 0212 01/26/16 0630  WBC 10.6* 7.1  HGB 16.5 14.6  HCT 46.3 41.9  MCV 87.5 87.8  PLT 256 194   Basic Metabolic Panel:  Recent Labs Lab 01/25/16 0212 01/26/16 0630  NA 134* 137  K 3.8 4.0  CL 97* 106  CO2 24  24  GLUCOSE 103* 96  BUN 7 13  CREATININE 0.75 0.70  CALCIUM 9.5 8.6*   GFR: Estimated Creatinine Clearance: 108.8 mL/min (by C-G formula based on SCr of 0.7 mg/dL). Liver Function Tests:  Recent Labs Lab 01/26/16 0630  AST 125*  ALT 133*  ALKPHOS 73  BILITOT 1.7*  PROT 5.9*  ALBUMIN 3.4*   No results for input(s): LIPASE, AMYLASE in the last 168 hours. No results for input(s): AMMONIA in the last 168 hours. Coagulation Profile: No results for input(s): INR, PROTIME in the last 168 hours. Cardiac Enzymes: No results for input(s): CKTOTAL, CKMB, CKMBINDEX, TROPONINI in the last 168 hours. BNP (last 3 results) No results for input(s): PROBNP in the last 8760 hours. HbA1C: No results for input(s): HGBA1C in the last 72 hours. CBG:  Recent Labs Lab 01/25/16 0825 01/26/16 0737  GLUCAP 93 105*   Lipid Profile: No results for input(s): CHOL, HDL, LDLCALC, TRIG, CHOLHDL, LDLDIRECT in the last 72 hours. Thyroid Function Tests:  Recent Labs  01/25/16 0643  TSH 2.378   Anemia Panel: No results for input(s): VITAMINB12, FOLATE, FERRITIN, TIBC, IRON, RETICCTPCT in the last 72 hours. Urine analysis:    Component Value Date/Time   COLORURINE AMBER (A) 01/25/2016 0844   APPEARANCEUR CLEAR 01/25/2016 0844   LABSPEC 1.019 01/25/2016 0844   PHURINE 5.0 01/25/2016 0844   GLUCOSEU NEGATIVE 01/25/2016 0844   HGBUR NEGATIVE 01/25/2016 0844   BILIRUBINUR NEGATIVE 01/25/2016 0844   KETONESUR 20 (A) 01/25/2016 0844   PROTEINUR 30 (A) 01/25/2016 0844   UROBILINOGEN 4.0 (H) 10/24/2013 1236   NITRITE NEGATIVE 01/25/2016 0844   LEUKOCYTESUR NEGATIVE 01/25/2016 0844   Sepsis Labs: @LABRCNTIP (procalcitonin:4,lacticidven:4)  )No results found for this or any previous visit (from the past 240 hour(s)).   Radiology Studies: Dg Chest 2 View  Result Date: 01/25/2016 CLINICAL DATA:  Acute onset of upper mid abdominal pain and cramping. Initial encounter. EXAM: CHEST  2 VIEW  COMPARISON:  Chest radiograph performed 07/07/2015 FINDINGS: The lungs are well-aerated. Mild peribronchial thickening is noted. There is no evidence of focal opacification, pleural effusion or pneumothorax. A calcified granuloma is noted at the left midlung zone. The heart is normal in size; the mediastinal contour is within normal limits. No acute osseous abnormalities are seen. IMPRESSION: Mild peribronchial thickening noted.  Lungs otherwise grossly clear. Electronically Signed   By: Roanna Raider M.D.   On: 01/25/2016 02:58    Scheduled Meds: . enoxaparin (LOVENOX) injection  40 mg Subcutaneous Q24H  . famotidine  20 mg Oral BID  . folic acid  1 mg Oral Daily  . multivitamin with minerals  1 tablet Oral Daily  . nicotine  21 mg Transdermal Daily  . thiamine  100 mg Oral Daily   Or  . thiamine  100 mg Intravenous Daily   Continuous  Infusions:   LOS: 1 day   Time spent: 25 minutes.  Hazeline Junker, MD [facility] Pager [phone]  If 7PM-7AM, please contact night-coverage [email or web address] Password [facility] 01/26/2016, 12:09 PM

## 2016-01-27 NOTE — Progress Notes (Signed)
Pt leaving this afternoon with a "friend". Alert, oriented, and without c/o. Pt left with discharge instructions and clothes.

## 2016-02-23 NOTE — Congregational Nurse Program (Signed)
Congregational Nurse Program Note  Date of Encounter: 02/04/2016  Past Medical History: Past Medical History:  Diagnosis Date  . Anxiety   . Depression   . Elevated liver enzymes   . ETOH abuse   . Hip dislocation   . Seizures (HCC)     Encounter Details:     CNP Questionnaire - 02/04/16 1944      Patient Demographics   Is this a new or existing patient? New   Patient is considered a/an Not Applicable   Race African-American/Black     Patient Assistance   Location of Patient Assistance Not Applicable   Patient's financial/insurance status Low Income;Self-Pay (Uninsured)   Uninsured Patient ([place] Card/Care Connects) Yes   Interventions Assisted patient in making appt.;Appt. has been completed   Patient referred to apply for the following financial assistance [employer] Renewal   Food insecurities addressed Not Applicable   Transportation assistance No   Assistance securing medications No   Product/process development scientistducational health offerings Navigating the healthcare system     Encounter Details   Primary purpose of visit Navigating the Healthcare System   Was an Emergency Department visit averted? Not Applicable   Does patient have a medical provider? No   Patient referred to Clinic   Was a mental health screening completed? (GAINS tool) No   Does patient have dental issues? No   Does patient have vision issues? No   Does your patient have an abnormal blood pressure today? No   Since previous encounter, have you referred patient for abnormal blood pressure that resulted in a new diagnosis or medication change? No   Does your patient have an abnormal blood glucose today? No   Since previous encounter, have you referred patient for abnormal blood glucose that resulted in a new diagnosis or medication change? No   Was there a life-saving intervention made? No      Does not have a HCP.  Ass[facility] with [facility] for him to see  Chales AbrahamsMary Ann Placey Np.  Was seen same day

## 2016-06-02 ENCOUNTER — Encounter (HOSPITAL_COMMUNITY): Payer: Self-pay | Admitting: Emergency Medicine

## 2016-06-02 ENCOUNTER — Inpatient Hospital Stay (HOSPITAL_COMMUNITY)
Admission: EM | Admit: 2016-06-02 | Discharge: 2016-06-06 | DRG: 897 | Disposition: A | Discharge status: Home / Self Care | Payer: Self-pay | Attending: Internal Medicine | Admitting: Internal Medicine

## 2016-06-02 ENCOUNTER — Emergency Department (HOSPITAL_COMMUNITY): Payer: Self-pay

## 2016-06-02 DIAGNOSIS — J209 Acute bronchitis, unspecified: Secondary | ICD-10-CM

## 2016-06-02 DIAGNOSIS — Z720 Tobacco use: Secondary | ICD-10-CM | POA: Diagnosis present

## 2016-06-02 DIAGNOSIS — J440 Chronic obstructive pulmonary disease with acute lower respiratory infection: Secondary | ICD-10-CM | POA: Diagnosis present

## 2016-06-02 DIAGNOSIS — F1210 Cannabis abuse, uncomplicated: Secondary | ICD-10-CM | POA: Diagnosis present

## 2016-06-02 DIAGNOSIS — F10239 Alcohol dependence with withdrawal, unspecified: Secondary | ICD-10-CM | POA: Diagnosis present

## 2016-06-02 DIAGNOSIS — F329 Major depressive disorder, single episode, unspecified: Secondary | ICD-10-CM | POA: Diagnosis present

## 2016-06-02 DIAGNOSIS — Z8249 Family history of ischemic heart disease and other diseases of the circulatory system: Secondary | ICD-10-CM

## 2016-06-02 DIAGNOSIS — F10932 Alcohol use, unspecified with withdrawal with perceptual disturbance: Secondary | ICD-10-CM

## 2016-06-02 DIAGNOSIS — F10232 Alcohol dependence with withdrawal with perceptual disturbance: Principal | ICD-10-CM | POA: Diagnosis present

## 2016-06-02 DIAGNOSIS — Y904 Blood alcohol level of 80-99 mg/100 ml: Secondary | ICD-10-CM | POA: Diagnosis present

## 2016-06-02 DIAGNOSIS — R569 Unspecified convulsions: Secondary | ICD-10-CM | POA: Diagnosis present

## 2016-06-02 DIAGNOSIS — Z5900 Homelessness unspecified: Secondary | ICD-10-CM

## 2016-06-02 DIAGNOSIS — F10939 Alcohol use, unspecified with withdrawal, unspecified: Secondary | ICD-10-CM | POA: Diagnosis present

## 2016-06-02 DIAGNOSIS — F17210 Nicotine dependence, cigarettes, uncomplicated: Secondary | ICD-10-CM | POA: Diagnosis present

## 2016-06-02 DIAGNOSIS — F419 Anxiety disorder, unspecified: Secondary | ICD-10-CM | POA: Diagnosis present

## 2016-06-02 LAB — RAPID URINE DRUG SCREEN, HOSP PERFORMED
AMPHETAMINES: NOT DETECTED
BENZODIAZEPINES: NOT DETECTED
Barbiturates: NOT DETECTED
Cocaine: NOT DETECTED
OPIATES: NOT DETECTED
TETRAHYDROCANNABINOL: POSITIVE — AB

## 2016-06-02 LAB — CBC
HCT: 45.1 % (ref 39.0–52.0)
HEMOGLOBIN: 15.7 g/dL (ref 13.0–17.0)
MCH: 30.2 pg (ref 26.0–34.0)
MCHC: 34.8 g/dL (ref 30.0–36.0)
MCV: 86.7 fL (ref 78.0–100.0)
Platelets: 171 10*3/uL (ref 150–400)
RBC: 5.2 MIL/uL (ref 4.22–5.81)
RDW: 13.2 % (ref 11.5–15.5)
WBC: 11.9 10*3/uL — ABNORMAL HIGH (ref 4.0–10.5)

## 2016-06-02 LAB — COMPREHENSIVE METABOLIC PANEL
ALBUMIN: 3.9 g/dL (ref 3.5–5.0)
ALT: 104 U/L — ABNORMAL HIGH (ref 17–63)
ANION GAP: 10 (ref 5–15)
AST: 141 U/L — ABNORMAL HIGH (ref 15–41)
Alkaline Phosphatase: 114 U/L (ref 38–126)
BILIRUBIN TOTAL: 0.6 mg/dL (ref 0.3–1.2)
BUN: 9 mg/dL (ref 6–20)
CO2: 28 mmol/L (ref 22–32)
Calcium: 9.4 mg/dL (ref 8.9–10.3)
Chloride: 98 mmol/L — ABNORMAL LOW (ref 101–111)
Creatinine, Ser: 0.65 mg/dL (ref 0.61–1.24)
GFR calc Af Amer: >60 mL/min (ref >60)
GFR calc non Af Amer: >60 mL/min (ref >60)
GLUCOSE: 107 mg/dL — AB (ref 65–99)
POTASSIUM: 4.1 mmol/L (ref 3.5–5.1)
SODIUM: 136 mmol/L (ref 135–145)
TOTAL PROTEIN: 7.6 g/dL (ref 6.5–8.1)

## 2016-06-02 LAB — ETHANOL: Alcohol, Ethyl (B): 86 mg/dL — ABNORMAL HIGH (ref <5)

## 2016-06-02 MED ORDER — SENNOSIDES-DOCUSATE SODIUM 8.6-50 MG PO TABS: 1.0000 | ORAL_TABLET | Freq: Every evening | ORAL | Status: DC | PRN

## 2016-06-02 MED ORDER — ALBUTEROL SULFATE (2.5 MG/3ML) 0.083% IN NEBU: 2.5000 mg | INHALATION_SOLUTION | RESPIRATORY_TRACT | Status: DC | PRN

## 2016-06-02 MED ORDER — LORAZEPAM 2 MG/ML IJ SOLN
0.0000 mg | Freq: Four times a day (QID) | INTRAMUSCULAR | Status: AC
  Administered 2016-06-02 – 2016-06-03 (×2): 2 mg via INTRAVENOUS
  Administered 2016-06-03: 1 mg via INTRAVENOUS
  Administered 2016-06-04 (×2): 2 mg via INTRAVENOUS
  Filled 2016-06-02 (×4): qty 1

## 2016-06-02 MED ORDER — THIAMINE HCL 100 MG/ML IJ SOLN: 100.0000 mg | Freq: Every day | INTRAMUSCULAR | Status: DC

## 2016-06-02 MED ORDER — ONDANSETRON HCL 4 MG/2ML IJ SOLN: 4.0000 mg | Freq: Four times a day (QID) | INTRAMUSCULAR | Status: DC | PRN

## 2016-06-02 MED ORDER — ADULT MULTIVITAMIN W/MINERALS CH
1.0000 | ORAL_TABLET | Freq: Every day | ORAL | Status: DC
  Administered 2016-06-02 – 2016-06-06 (×5): 1 via ORAL
  Filled 2016-06-02 (×5): qty 1

## 2016-06-02 MED ORDER — VITAMIN B-1 100 MG PO TABS
100.0000 mg | ORAL_TABLET | Freq: Every day | ORAL | Status: DC
  Administered 2016-06-02 – 2016-06-06 (×5): 100 mg via ORAL
  Filled 2016-06-02 (×6): qty 1

## 2016-06-02 MED ORDER — SODIUM CHLORIDE 0.9% FLUSH
3.0000 mL | Freq: Two times a day (BID) | INTRAVENOUS | Status: DC
  Administered 2016-06-03 – 2016-06-05 (×4): 3 mL via INTRAVENOUS

## 2016-06-02 MED ORDER — ONDANSETRON HCL 4 MG PO TABS: 4.0000 mg | ORAL_TABLET | Freq: Four times a day (QID) | ORAL | Status: DC | PRN

## 2016-06-02 MED ORDER — THIAMINE HCL 100 MG/ML IJ SOLN
100.0000 mg | Freq: Every day | INTRAMUSCULAR | Status: DC
  Administered 2016-06-02: 100 mg via INTRAVENOUS
  Filled 2016-06-02: qty 2

## 2016-06-02 MED ORDER — FOLIC ACID 1 MG PO TABS
1.0000 mg | ORAL_TABLET | Freq: Every day | ORAL | Status: DC
  Administered 2016-06-02 – 2016-06-06 (×5): 1 mg via ORAL
  Filled 2016-06-02 (×5): qty 1

## 2016-06-02 MED ORDER — LORAZEPAM 2 MG/ML IJ SOLN
1.0000 mg | Freq: Four times a day (QID) | INTRAMUSCULAR | Status: AC | PRN
  Administered 2016-06-02 – 2016-06-05 (×4): 1 mg via INTRAVENOUS
  Filled 2016-06-02 (×5): qty 1

## 2016-06-02 MED ORDER — LORAZEPAM 2 MG/ML IJ SOLN: 1.0000 mg | Freq: Four times a day (QID) | INTRAMUSCULAR | Status: DC | PRN

## 2016-06-02 MED ORDER — LORAZEPAM 2 MG/ML IJ SOLN
0.0000 mg | Freq: Four times a day (QID) | INTRAMUSCULAR | Status: DC
  Administered 2016-06-02: 2 mg via INTRAVENOUS
  Filled 2016-06-02: qty 1

## 2016-06-02 MED ORDER — ACETAMINOPHEN 650 MG RE SUPP: 650.0000 mg | Freq: Four times a day (QID) | RECTAL | Status: DC | PRN

## 2016-06-02 MED ORDER — VITAMIN B-1 100 MG PO TABS: 100.0000 mg | ORAL_TABLET | Freq: Every day | ORAL | Status: DC

## 2016-06-02 MED ORDER — CHLORHEXIDINE GLUCONATE 0.12 % MT SOLN
15.0000 mL | Freq: Two times a day (BID) | OROMUCOSAL | Status: DC
  Administered 2016-06-02 – 2016-06-06 (×7): 15 mL via OROMUCOSAL
  Filled 2016-06-02 (×6): qty 15

## 2016-06-02 MED ORDER — ORAL CARE MOUTH RINSE
15.0000 mL | Freq: Two times a day (BID) | OROMUCOSAL | Status: DC
  Administered 2016-06-03 – 2016-06-05 (×2): 15 mL via OROMUCOSAL

## 2016-06-02 MED ORDER — ALBUTEROL SULFATE HFA 108 (90 BASE) MCG/ACT IN AERS
2.0000 | INHALATION_SPRAY | Freq: Once | RESPIRATORY_TRACT | Status: AC
  Administered 2016-06-02: 2 via RESPIRATORY_TRACT
  Filled 2016-06-02: qty 6.7

## 2016-06-02 MED ORDER — LORAZEPAM 2 MG/ML IJ SOLN: 0.0000 mg | Freq: Two times a day (BID) | INTRAMUSCULAR | Status: DC

## 2016-06-02 MED ORDER — LABETALOL HCL 5 MG/ML IV SOLN
10.0000 mg | INTRAVENOUS | Status: DC | PRN
  Filled 2016-06-02: qty 4

## 2016-06-02 MED ORDER — SODIUM CHLORIDE 0.9 % IV SOLN
INTRAVENOUS | Status: DC
  Administered 2016-06-02: 16:00:00 via INTRAVENOUS

## 2016-06-02 MED ORDER — LORAZEPAM 2 MG/ML IJ SOLN: 0.0000 mg | Freq: Four times a day (QID) | INTRAMUSCULAR | Status: DC

## 2016-06-02 MED ORDER — ACETAMINOPHEN 325 MG PO TABS
650.0000 mg | ORAL_TABLET | Freq: Four times a day (QID) | ORAL | Status: DC | PRN
Start: 2016-06-02 — End: 2016-06-06
  Administered 2016-06-04: 650 mg via ORAL
  Filled 2016-06-02: qty 2

## 2016-06-02 MED ORDER — NICOTINE 21 MG/24HR TD PT24
21.0000 mg | MEDICATED_PATCH | Freq: Every day | TRANSDERMAL | Status: DC
  Administered 2016-06-03 – 2016-06-06 (×4): 21 mg via TRANSDERMAL
  Filled 2016-06-02 (×5): qty 1

## 2016-06-02 MED ORDER — ENOXAPARIN SODIUM 40 MG/0.4ML ~~LOC~~ SOLN
40.0000 mg | SUBCUTANEOUS | Status: DC
  Administered 2016-06-02 – 2016-06-05 (×4): 40 mg via SUBCUTANEOUS
  Filled 2016-06-02 (×4): qty 0.4

## 2016-06-02 MED ORDER — LORAZEPAM 1 MG PO TABS: 1.0000 mg | ORAL_TABLET | Freq: Four times a day (QID) | ORAL | Status: DC | PRN

## 2016-06-02 MED ORDER — LORAZEPAM 1 MG PO TABS
1.0000 mg | ORAL_TABLET | Freq: Four times a day (QID) | ORAL | Status: AC | PRN
  Administered 2016-06-03: 1 mg via ORAL
  Filled 2016-06-02: qty 1

## 2016-06-02 MED ORDER — FOLIC ACID 1 MG PO TABS
1.0000 mg | ORAL_TABLET | Freq: Every day | ORAL | Status: DC
  Administered 2016-06-02: 1 mg via ORAL
  Filled 2016-06-02: qty 1

## 2016-06-02 MED ORDER — NICOTINE 21 MG/24HR TD PT24
21.0000 mg | MEDICATED_PATCH | Freq: Every day | TRANSDERMAL | Status: DC
  Administered 2016-06-02: 21 mg via TRANSDERMAL
  Filled 2016-06-02: qty 1

## 2016-06-02 MED ORDER — LORAZEPAM 2 MG/ML IJ SOLN
0.0000 mg | Freq: Two times a day (BID) | INTRAMUSCULAR | Status: AC
  Administered 2016-06-04 – 2016-06-05 (×4): 2 mg via INTRAVENOUS
  Filled 2016-06-02 (×4): qty 1

## 2016-06-02 MED ORDER — ADULT MULTIVITAMIN W/MINERALS CH
1.0000 | ORAL_TABLET | Freq: Every day | ORAL | Status: DC
  Administered 2016-06-02: 1 via ORAL
  Filled 2016-06-02: qty 1

## 2016-06-02 NOTE — ED Triage Notes (Signed)
Per pt, he is homeless and his counselor told him to come to ER for withdrawals. Pt states he has a hx of seizures with withdrawal. Pt presents with visible remors in hands, sweat, and mild anxiousness.  Pt denies headache or nausea at this time.  Last drink was at 1130 this am.

## 2016-06-02 NOTE — ED Provider Notes (Signed)
[facility]-EMERGENCY DEPT Provider Note   CSN: [id number] Arrival date & time: 06/02/16  1252     History   Chief Complaint Chief Complaint  Patient presents with  . Alcohol Problem    Withdrawals    HPI Jake [name] is a 46 y.o. male.  HPI   Pt with hx alcohol abuse and alcohol withdrawal seizures p/w withdrawal symptoms.  States he usually drinks beer and liquor daily until he passes out.  He last drink at this level last night.  He has had a single beer today at 11am.  He is having hot sweats, tremors, cramping all over.    Past Medical History:  Diagnosis Date  . Anxiety   . Depression   . Elevated liver enzymes   . ETOH abuse   . Hip dislocation   . Seizures [facility])     Patient Active Problem List   Diagnosis Date Noted  . Alcohol withdrawal (HCC) 06/02/2016  . Tobacco abuse 06/02/2016  . Protein-calorie malnutrition, severe (HCC) 07/10/2015  . Transaminitis 07/07/2015  . Hypokalemia 07/07/2015  . Alcohol withdrawal delirium, acute, mixed level of activity (HCC) 07/07/2015  . Severe recurrent major depression without psychotic features (HCC) 04/10/2015  . 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 Alcohol dependence with alcohol-induced mood disorder (HCC) 03/09/2014  . Polysubstance abuse 03/09/2014  . Opioid dependence (HCC) 03/09/2014  . Alcohol use disorder, severe, dependence (HCC) 03/08/2014  . Substance induced mood disorder (HCC) 03/08/2014  . Alcohol abuse 06/28/2013  . Alcohol withdrawal syndrome without complication (HCC) 06/28/2013  . DTs (delirium tremens) (HCC) 06/28/2013  . Marijuana abuse 04/28/2011  . Alcoholism (HCC) 04/27/2011  . Toxic encephalopathy 04/27/2011  . Psychosis 04/27/2011  . Leukocytosis 04/27/2011  . Hyponatremia 04/27/2011  . Thrombocytopenia (HCC) 04/27/2011  . Elevated AST (SGOT) 04/27/2011  . Elevated bilirubin 04/27/2011    History reviewed. No pertinent surgical history.     Home Medications    Prior  to Admission medications   Medication Sig Start Date End Date Taking? Authorizing Provider  hydrOXYzine (ATARAX/VISTARIL) 25 MG tablet Take 1 tablet (25 mg total) by mouth every 6 (six) hours as needed for anxiety. Patient not taking: Reported on 06/02/2016 04/13/15   Armandina Stammer I, NP  Multiple Vitamin (MULTIVITAMIN WITH MINERALS) TABS tablet Take 1 tablet by mouth daily. For low Vitamin Patient not taking: Reported on 01/25/2016 04/13/15   Armandina Stammer I, NP  promethazine (PHENERGAN) 25 MG tablet Take 1 tablet (25 mg total) by mouth every 6 (six) hours as needed for nausea or vomiting. Patient not taking: Reported on 01/25/2016 07/01/15   Antony Madura, PA-C  traZODone (DESYREL) 100 MG tablet Take 1 tablet (100 mg total) by mouth at bedtime and may repeat dose one time if needed. For sleep Patient not taking: Reported on 01/25/2016 04/13/15   Sanjuana Kava, NP    Family History Family History  Problem Relation Age of Onset  . Heart attack Father     Social History Social History  Substance Use Topics  . Smoking status: Current Every Day Smoker    Packs/day: 2.00    Types: Cigarettes  . Smokeless tobacco: Never Used  . Alcohol use 4.8 oz/week    3 Cans of beer, 5 Shots of liquor per week     Comment: daily     Allergies   Patient has no known allergies.   Review of Systems Review of Systems  All other systems reviewed and are negative.  Physical Exam Updated Vital Signs BP 135/84 (BP Location: Left Arm)   Pulse 86   Temp 98.5 F (36.9 C) (Oral)   Resp 20   Ht 6\' 1"  (1.854 m)   Wt 60.2 kg (132 lb 11.5 oz)   SpO2 92%   BMI 17.51 kg/m   Physical Exam  Constitutional: He appears well-developed and well-nourished. No distress.  Constant tremor.  HENT:  Head: Normocephalic and atraumatic.  Neck: Neck supple.  Cardiovascular: Normal rate and regular rhythm.   Pulmonary/Chest: Effort normal. No respiratory distress. He has wheezes (Right sided wheezing). He has no  rales.  Abdominal: Soft. He exhibits no distension and no mass. There is no tenderness. There is no rebound and no guarding.  Neurological: He is alert. He exhibits normal muscle tone.  Skin: He is not diaphoretic.  Nursing note and vitals reviewed.    ED Treatments / Results  Labs (all labs ordered are listed, but only abnormal results are displayed) Labs Reviewed  COMPREHENSIVE METABOLIC PANEL - Abnormal; Notable for the following:       Result Value   Chloride 98 (*)    Glucose, Bld 107 (*)    AST 141 (*)    ALT 104 (*)    All other components within normal limits  ETHANOL - Abnormal; Notable for the following:    Alcohol, Ethyl (B) 86 (*)    All other components within normal limits  CBC - Abnormal; Notable for the following:    WBC 11.9 (*)    All other components within normal limits  RAPID URINE DRUG SCREEN, HOSP PERFORMED - Abnormal; Notable for the following:    Tetrahydrocannabinol POSITIVE (*)    All other components within normal limits  HIV ANTIBODY (ROUTINE TESTING)  BASIC METABOLIC PANEL  CBC  MAGNESIUM  PHOSPHORUS    EKG  EKG Interpretation None       Radiology Dg Chest 2 View  Result Date: 06/02/2016 CLINICAL DATA:  Right-sided chest pain EXAM: CHEST  2 VIEW COMPARISON:  01/25/2016 FINDINGS: Cardiac shadow is within normal limits. The lungs are well aerated bilaterally. Mild interstitial changes are again seen and stable. No focal infiltrate or sizable effusion is seen. No acute bony abnormality is noted. IMPRESSION: No active cardiopulmonary disease. Electronically Signed   By: Alcide CleverMark  Lukens M.D.   On: 06/02/2016 14:11    Procedures Procedures (including critical care time)  Medications Ordered in ED Medications  LORazepam (ATIVAN) tablet 1 mg ( Oral See Alternative 06/02/16 2252)    Or  LORazepam (ATIVAN) injection 1 mg (1 mg Intravenous Given 06/02/16 2252)  thiamine (VITAMIN B-1) tablet 100 mg (100 mg Oral Given 06/02/16 1724)    Or  thiamine  (B-1) injection 100 mg ( Intravenous See Alternative 06/02/16 1724)  folic acid (FOLVITE) tablet 1 mg (1 mg Oral Given 06/02/16 1724)  multivitamin with minerals tablet 1 tablet (1 tablet Oral Given 06/02/16 1724)  ondansetron (ZOFRAN) tablet 4 mg (not administered)    Or  ondansetron (ZOFRAN) injection 4 mg (not administered)  albuterol (PROVENTIL) (2.5 MG/3ML) 0.083% nebulizer solution 2.5 mg (not administered)  enoxaparin (LOVENOX) injection 40 mg (40 mg Subcutaneous Given 06/02/16 2100)  sodium chloride flush (NS) 0.9 % injection 3 mL (3 mLs Intravenous Not Given 06/02/16 2200)  0.9 %  sodium chloride infusion ( Intravenous New Bag/Given 06/02/16 1605)  acetaminophen (TYLENOL) tablet 650 mg (not administered)    Or  acetaminophen (TYLENOL) suppository 650 mg (not administered)  senna-docusate (Senokot-S) tablet  1 tablet (not administered)  nicotine (NICODERM CQ - dosed in mg/24 hours) patch 21 mg (21 mg Transdermal Not Given 06/02/16 1724)  labetalol (NORMODYNE,TRANDATE) injection 10 mg (not administered)  LORazepam (ATIVAN) injection 0-4 mg (2 mg Intravenous Given 06/02/16 1725)    Followed by  LORazepam (ATIVAN) injection 0-4 mg (not administered)  chlorhexidine (PERIDEX) 0.12 % solution 15 mL (15 mLs Mouth Rinse Given 06/02/16 2100)  MEDLINE mouth rinse (not administered)  albuterol (PROVENTIL HFA;VENTOLIN HFA) 108 (90 Base) MCG/ACT inhaler 2 puff (2 puffs Inhalation Given 06/02/16 1441)     Initial Impression / Assessment and Plan / ED Course  I have reviewed the triage vital signs and the nursing notes.  Pertinent labs & imaging results that were available during my care of the patient were reviewed by me and considered in my medical decision making (see chart for details).  Clinical Course as of Jun 02 2256  Mon Jun 02, 2016  1426 CIWA is 44.   [EW]    Clinical Course User Index [EW] Trixie Dredge, [place]    Alcoholic with withdrawal symptoms and hx withdrawal seizures.  CIWA is 14.   Admitted to [facility].      Final Clinical Impressions(s) / ED Diagnoses   Final diagnoses:  Alcohol withdrawal syndrome with perceptual disturbance [facility])    New Prescriptions Current Discharge Medication List       Trixie Dredge, Cordelia Poche 06/02/16 2258    Gerhard Munch, MD 06/06/16 [phone]

## 2016-06-02 NOTE — ED Notes (Signed)
Charge RN informed of pt's condition and hx of seizures with withdrawal. This RN advised to leave pt in the hallway until the provider sees them to assess if they need to be in a room on the monitor.    Pt placed within visual range of desk and seizure precautions initiated.  Seizure pads on bed rails.

## 2016-06-02 NOTE — H&P (Signed)
HISTORY AND PHYSICAL       PATIENT DETAILS Name: Jake [name] Age: 46 y.o. Sex: male Date of Birth: 20-Aug-1970 Admit Date: 06/02/2016 ZOX:[id number], No Pcp Per   Patient coming from: Home   CHIEF COMPLAINT:  "Tired of drinking-I want to detox"  HPI: Jake [name] is a 46 y.o. male with medical history significant of long-standing alcohol abuse, tobacco abuse who presented to the ED because he is tired of drinking and wants to detox. Per patient, he drinks approximately 1/5 of alcohol (sometimes more) on a daily basis. He claims that he usually drinks till he passes out.d He has been talking to his counselor for the past few days, and today he he finally decided to stop drinking, as he could not take it anymore. He claims that he had one 16 ounce beer around 11 AM, and has not drank since then. He thought of having tremors, because of his prior history of alcohol withdrawal seizures-his counselor encouraged him to come to the emergency room for medical evaluation. In the emergency room he was found to have mild tachycardia and tremors, but he was awake and alert, as a result the hospitalist service was asked to admit this patient for further evaluation and treatment.  Patient denies any headache, fever, shortness of breath, chest pain, nausea, vomiting or diarrhea  ED Course:  Patient started on Ativan per protocol-and referred to the hospitalist service.  Note: Lives at: Home Mobility:  Independent Chronic Indwelling Foley:no   REVIEW OF SYSTEMS:  Constitutional:   No  weight loss, night sweats,  Fevers, chills, fatigue.  HEENT:    No headaches, Dysphagia,Tooth/dental problems,Sore throat  Cardio-vascular: No chest pain,Orthopnea, PND,lower extremity edema, anasarca, palpitations  GI:  No heartburn, indigestion, abdominal pain, nausea, vomiting, diarrhea, melena or hematochezia  Resp: No shortness of breath, cough, hemoptysis,plueritic chest pain.    Skin:  No rash or lesions.  GU:  No dysuria, change in color of urine, no urgency or frequency.  No flank pain.  Musculoskeletal: No joint pain or swelling.  No decreased range of motion.  No back pain.  Endocrine: No heat intolerance, no cold intolerance, no polyuria, no polydipsia  Psych: No change in mood or affect. No depression or anxiety.  No memory loss.   ALLERGIES:  No Known Allergies  PAST MEDICAL HISTORY: Past Medical History:  Diagnosis Date  . Anxiety   . Depression   . Elevated liver enzymes   . ETOH abuse   . Hip dislocation   . Seizures (HCC)     PAST SURGICAL HISTORY: History reviewed. No pertinent surgical history.  MEDICATIONS AT HOME: Prior to Admission medications   Medication Sig Start Date End Date Taking? Authorizing Provider  hydrOXYzine (ATARAX/VISTARIL) 25 MG tablet Take 1 tablet (25 mg total) by mouth every 6 (six) hours as needed for anxiety. Patient not taking: Reported on 06/02/2016 04/13/15   Armandina Stammer I, NP  Multiple Vitamin (MULTIVITAMIN WITH MINERALS) TABS tablet Take 1 tablet by mouth daily. For low Vitamin Patient not taking: Reported on 01/25/2016 04/13/15   Armandina Stammer I, NP  promethazine (PHENERGAN) 25 MG tablet Take 1 tablet (25 mg total) by mouth every 6 (six) hours as needed for nausea or vomiting. Patient not taking: Reported on 01/25/2016 07/01/15   Antony Madura, PA-C  traZODone (DESYREL) 100 MG tablet Take 1 tablet (100 mg total) by mouth at bedtime and may repeat dose one time if needed. For  sleep Patient not taking: Reported on 01/25/2016 04/13/15   Sanjuana KavaNwoko, Agnes I, NP    FAMILY HISTORY: Family History  Problem Relation Age of Onset  . Heart attack Father    SOCIAL HISTORY:  reports that he has been smoking Cigarettes.  He has been smoking about 2.00 packs per day. He has never used smokeless tobacco. He reports that he drinks about 4.8 oz of alcohol per week . He reports that he does not use drugs.  PHYSICAL  EXAM: Blood pressure (!) 125/92, pulse 88, temperature 99.2 F (37.3 C), resp. rate 18, height 6\' 1"  (1.854 m), weight 63.5 kg (140 lb), SpO2 94 %.  General appearance :Awake, alert, not in any distress. Speech Clear. Not toxic Looking. Slightly tremulous Eyes:, pupils equally reactive to light and accomodation,no scleral icterus.Pink conjunctiva HEENT: Atraumatic and Normocephalic Neck: supple, no JVD. No cervical lymphadenopathy. No thyromegaly Resp:Good air entry bilaterally, no added sounds  CVS: S1 S2 regular, no murmurs.  GI: Bowel sounds present, Non tender and not distended with no gaurding, rigidity or rebound.No organomegaly Extremities: B/L Lower Ext shows no edema, both legs are warm to touch Neurology:  speech clear,Non focal, sensation is grossly intact. Psychiatric: Normal judgment and insight. Alert and oriented x 3. Normal mood. Musculoskeletal:No digital cyanosis Skin:No Rash, warm and dry Wounds:N/A  LABS ON ADMISSION:  I have personally reviewed following labs and imaging studies  CBC:  Recent Labs Lab 06/02/16 1349  WBC 11.9*  HGB 15.7  HCT 45.1  MCV 86.7  PLT 171    Basic Metabolic Panel:  Recent Labs Lab 06/02/16 1349  NA 136  K 4.1  CL 98*  CO2 28  GLUCOSE 107*  BUN 9  CREATININE 0.65  CALCIUM 9.4    GFR: Estimated Creatinine Clearance: 104.7 mL/min (by C-G formula based on SCr of 0.65 mg/dL).  Liver Function Tests:  Recent Labs Lab 06/02/16 1349  AST 141*  ALT 104*  ALKPHOS 114  BILITOT 0.6  PROT 7.6  ALBUMIN 3.9   No results for input(s): LIPASE, AMYLASE in the last 168 hours. No results for input(s): AMMONIA in the last 168 hours.  Coagulation Profile: No results for input(s): INR, PROTIME in the last 168 hours.  Cardiac Enzymes: No results for input(s): CKTOTAL, CKMB, CKMBINDEX, TROPONINI in the last 168 hours.  BNP (last 3 results) No results for input(s): PROBNP in the last 8760 hours.  HbA1C: No results for  input(s): HGBA1C in the last 72 hours.  CBG: No results for input(s): GLUCAP in the last 168 hours.  Lipid Profile: No results for input(s): CHOL, HDL, LDLCALC, TRIG, CHOLHDL, LDLDIRECT in the last 72 hours.  Thyroid Function Tests: No results for input(s): TSH, T4TOTAL, FREET4, T3FREE, THYROIDAB in the last 72 hours.  Anemia Panel: No results for input(s): VITAMINB12, FOLATE, FERRITIN, TIBC, IRON, RETICCTPCT in the last 72 hours.  Urine analysis:    Component Value Date/Time   COLORURINE AMBER (A) 01/25/2016 0844   APPEARANCEUR CLEAR 01/25/2016 0844   LABSPEC 1.019 01/25/2016 0844   PHURINE 5.0 01/25/2016 0844   GLUCOSEU NEGATIVE 01/25/2016 0844   HGBUR NEGATIVE 01/25/2016 0844   BILIRUBINUR NEGATIVE 01/25/2016 0844   KETONESUR 20 (A) 01/25/2016 0844   PROTEINUR 30 (A) 01/25/2016 0844   UROBILINOGEN 4.0 (H) 10/24/2013 1236   NITRITE NEGATIVE 01/25/2016 0844   LEUKOCYTESUR NEGATIVE 01/25/2016 0844    Sepsis Labs: Lactic Acid, Venous No results found for: LATICACIDVEN   Microbiology: No results found for this or  any previous visit (from the past 240 hour(s)).    RADIOLOGIC STUDIES ON ADMISSION: Dg Chest 2 View  Result Date: 06/02/2016 CLINICAL DATA:  Right-sided chest pain EXAM: CHEST  2 VIEW COMPARISON:  01/25/2016 FINDINGS: Cardiac shadow is within normal limits. The lungs are well aerated bilaterally. Mild interstitial changes are again seen and stable. No focal infiltrate or sizable effusion is seen. No acute bony abnormality is noted. IMPRESSION: No active cardiopulmonary disease. Electronically Signed   By: Alcide Clever M.D.   On: 06/02/2016 14:11    I have personally reviewed images of chest xray  ASSESSMENT AND PLAN: Alcohol withdrawal: Will continue Ativan per protocol-continue MDI/thiamine and folate as well. Monitor closely in watch for signs of delirium tremens. He has been counseled extensively, will require social work counseling and evaluation prior to  discharge.  History of seizures: Per patient this is in the setting of alcohol withdrawal-he has no prior history of brain/head injury or history of epilepsy. Will be on Ativan-monitor for now.  Tobacco abuse: Counseled-start transdermal nicotine  Further plan will depend as patient's clinical course evolves and further radiologic and laboratory data become available. Patient will be monitored closely.  Above noted plan was discussed with patient/ face to face at bedside, he was in agreement.   CONSULTS: None   DVT Prophylaxis: Prophylactic Lovenox   Code Status: Full Code  Disposition Plan:  Discharge back home possibly in 2-3 days, depending on clinical course  Admission status: Inpatient  going to tele  The medical decision making on this patient was of high complexity and the patient is at high risk for clinical deterioration, therefore this is a level 3 visit.  Total time spent  55 minutes.Greater than 50% of this time was spent in counseling, explanation of diagnosis, planning of further management, and coordination of care.  Jeoffrey Massed [facility] Pager [phone]  If 7PM-7AM, please contact night-coverage [email or web address] Password [facility] 06/02/2016, 2:48 PM

## 2016-06-03 DIAGNOSIS — Z5900 Homelessness unspecified: Secondary | ICD-10-CM

## 2016-06-03 DIAGNOSIS — F1210 Cannabis abuse, uncomplicated: Secondary | ICD-10-CM

## 2016-06-03 DIAGNOSIS — J209 Acute bronchitis, unspecified: Secondary | ICD-10-CM

## 2016-06-03 LAB — CBC
HEMATOCRIT: 44.5 % (ref 39.0–52.0)
Hemoglobin: 15 g/dL (ref 13.0–17.0)
MCH: 29.4 pg (ref 26.0–34.0)
MCHC: 33.7 g/dL (ref 30.0–36.0)
MCV: 87.3 fL (ref 78.0–100.0)
Platelets: 168 10*3/uL (ref 150–400)
RBC: 5.1 MIL/uL (ref 4.22–5.81)
RDW: 13 % (ref 11.5–15.5)
WBC: 9.1 10*3/uL (ref 4.0–10.5)

## 2016-06-03 LAB — PHOSPHORUS: PHOSPHORUS: 3.2 mg/dL (ref 2.5–4.6)

## 2016-06-03 LAB — BASIC METABOLIC PANEL
ANION GAP: 7 (ref 5–15)
BUN: 11 mg/dL (ref 6–20)
CO2: 25 mmol/L (ref 22–32)
Calcium: 8.4 mg/dL — ABNORMAL LOW (ref 8.9–10.3)
Chloride: 102 mmol/L (ref 101–111)
Creatinine, Ser: 0.67 mg/dL (ref 0.61–1.24)
GFR calc Af Amer: >60 mL/min (ref >60)
Glucose, Bld: 102 mg/dL — ABNORMAL HIGH (ref 65–99)
POTASSIUM: 3.8 mmol/L (ref 3.5–5.1)
SODIUM: 134 mmol/L — AB (ref 135–145)

## 2016-06-03 LAB — MAGNESIUM: Magnesium: 1.5 mg/dL — ABNORMAL LOW (ref 1.7–2.4)

## 2016-06-03 LAB — HIV ANTIBODY (ROUTINE TESTING W REFLEX): HIV Screen 4th Generation wRfx: NONREACTIVE

## 2016-06-03 MED ORDER — ENSURE ENLIVE PO LIQD
237.0000 mL | Freq: Three times a day (TID) | ORAL | Status: DC
  Administered 2016-06-03 – 2016-06-06 (×5): 237 mL via ORAL

## 2016-06-03 MED ORDER — MAGNESIUM SULFATE 2 GM/50ML IV SOLN
2.0000 g | Freq: Once | INTRAVENOUS | Status: AC
  Administered 2016-06-03: 2 g via INTRAVENOUS
  Filled 2016-06-03: qty 50

## 2016-06-03 MED ORDER — MAGNESIUM OXIDE 400 (241.3 MG) MG PO TABS
400.0000 mg | ORAL_TABLET | Freq: Every day | ORAL | Status: DC
  Administered 2016-06-03 – 2016-06-06 (×4): 400 mg via ORAL
  Filled 2016-06-03 (×4): qty 1

## 2016-06-03 MED ORDER — AZITHROMYCIN 250 MG PO TABS
500.0000 mg | ORAL_TABLET | Freq: Every day | ORAL | Status: AC
  Administered 2016-06-03: 500 mg via ORAL
  Filled 2016-06-03: qty 2

## 2016-06-03 MED ORDER — AZITHROMYCIN 250 MG PO TABS
250.0000 mg | ORAL_TABLET | Freq: Every day | ORAL | Status: DC
  Administered 2016-06-04 – 2016-06-06 (×3): 250 mg via ORAL
  Filled 2016-06-03 (×3): qty 1

## 2016-06-03 NOTE — Progress Notes (Signed)
PROGRESS NOTE    Jake [name] Iverson   ZHY:[id number]  DOB: 01/08/1971  DOA: 06/02/2016 PCP: Patient, No Pcp Per   Brief Narrative:  Jake [name] Kornegay is a 46 y.o. male with medical history of alcohol abuse, tobacco abuse, marijuana abuse who presented to the ED as his counselor suspected that he would have a seizure form withdrawal.   Subjective: Admits to a cough with green sputum for the past 2 wks now. No dyspnea. Feels shaky. No nausea, vomiting or diarrhea.   Assessment & Plan:   Principal Problem:   Alcohol withdrawal- alcohol abuse - cont CIWA scale - will ask social worker to assist with resources to help him abstain  Active Problems: Hypomagnesemia - replace    Tobacco abuse - Nicotine patch    Acute bronchitis - Z pak started today - PRN Nebs - ? underlying COPD     Marijuana abuse  Homeless - he has a case worker who is helping him obtain housing   DVT prophylaxis: Lovenox Code Status: Full code Family Communication:  Disposition Plan: follow on med surg Consultants:    Procedures:    Antimicrobials:  Anti-infectives    Start     Dose/Rate Route Frequency Ordered Stop   06/04/16 1000  azithromycin (ZITHROMAX) tablet 250 mg     250 mg Oral Daily 06/03/16 0937 06/08/16 0959   06/03/16 1030  azithromycin (ZITHROMAX) tablet 500 mg     500 mg Oral Daily 06/03/16 0937 06/03/16 1046       Objective: Vitals:   06/02/16 2153 06/03/16 0411 06/03/16 1100 06/03/16 1305  BP: 135/84 131/88 133/83 (!) 144/70  Pulse: 86 64 74 86  Resp: 20 20 18 16   Temp: 98.5 F (36.9 C) 97.5 F (36.4 C) 98.1 F (36.7 C) 98.4 F (36.9 C)  TempSrc: Oral Oral Oral Oral  SpO2: 92% 94% 96% 95%  Weight:      Height:        Intake/Output Summary (Last 24 hours) at 06/03/16 1410 Last data filed at 06/03/16 1303  Gross per 24 hour  Intake          2711.66 ml  Output              475 ml  Net          2236.66 ml   Filed Weights   06/02/16 1306 06/02/16 1607  Weight:  63.5 kg (140 lb) 60.2 kg (132 lb 11.5 oz)    Examination: General exam: Appears comfortable  HEENT: PERRLA, oral mucosa moist, no sclera icterus or thrush Respiratory system: Clear to auscultation. Respiratory effort normal. Cardiovascular system: S1 & S2 heard, RRR.  No murmurs  Gastrointestinal system: Abdomen soft, non-tender, nondistended. Normal bowel sound. No organomegaly Central nervous system: Alert and oriented. No focal neurological deficits. Extremities: No cyanosis, clubbing or edema Skin: No rashes or ulcers Psychiatry:  Mood & affect appropriate.     Data Reviewed: I have personally reviewed following labs and imaging studies  CBC:  Recent Labs Lab 06/02/16 1349 06/03/16 0543  WBC 11.9* 9.1  HGB 15.7 15.0  HCT 45.1 44.5  MCV 86.7 87.3  PLT 171 168   Basic Metabolic Panel:  Recent Labs Lab 06/02/16 1349 06/03/16 0543  NA 136 134*  K 4.1 3.8  CL 98* 102  CO2 28 25  GLUCOSE 107* 102*  BUN 9 11  CREATININE 0.65 0.67  CALCIUM 9.4 8.4*  MG  --  1.5*  PHOS  --  3.2   GFR: Estimated Creatinine Clearance: 99.3 mL/min (by C-G formula based on SCr of 0.67 mg/dL). Liver Function Tests:  Recent Labs Lab 06/02/16 1349  AST 141*  ALT 104*  ALKPHOS 114  BILITOT 0.6  PROT 7.6  ALBUMIN 3.9   No results for input(s): LIPASE, AMYLASE in the last 168 hours. No results for input(s): AMMONIA in the last 168 hours. Coagulation Profile: No results for input(s): INR, PROTIME in the last 168 hours. Cardiac Enzymes: No results for input(s): CKTOTAL, CKMB, CKMBINDEX, TROPONINI in the last 168 hours. BNP (last 3 results) No results for input(s): PROBNP in the last 8760 hours. HbA1C: No results for input(s): HGBA1C in the last 72 hours. CBG: No results for input(s): GLUCAP in the last 168 hours. Lipid Profile: No results for input(s): CHOL, HDL, LDLCALC, TRIG, CHOLHDL, LDLDIRECT in the last 72 hours. Thyroid Function Tests: No results for input(s): TSH,  T4TOTAL, FREET4, T3FREE, THYROIDAB in the last 72 hours. Anemia Panel: No results for input(s): VITAMINB12, FOLATE, FERRITIN, TIBC, IRON, RETICCTPCT in the last 72 hours. Urine analysis:    Component Value Date/Time   COLORURINE AMBER (A) 01/25/2016 0844   APPEARANCEUR CLEAR 01/25/2016 0844   LABSPEC 1.019 01/25/2016 0844   PHURINE 5.0 01/25/2016 0844   GLUCOSEU NEGATIVE 01/25/2016 0844   HGBUR NEGATIVE 01/25/2016 0844   BILIRUBINUR NEGATIVE 01/25/2016 0844   KETONESUR 20 (A) 01/25/2016 0844   PROTEINUR 30 (A) 01/25/2016 0844   UROBILINOGEN 4.0 (H) 10/24/2013 1236   NITRITE NEGATIVE 01/25/2016 0844   LEUKOCYTESUR NEGATIVE 01/25/2016 0844   Sepsis Labs: @LABRCNTIP (procalcitonin:4,lacticidven:4) )No results found for this or any previous visit (from the past 240 hour(s)).       Radiology Studies: Dg Chest 2 View  Result Date: 06/02/2016 CLINICAL DATA:  Right-sided chest pain EXAM: CHEST  2 VIEW COMPARISON:  01/25/2016 FINDINGS: Cardiac shadow is within normal limits. The lungs are well aerated bilaterally. Mild interstitial changes are again seen and stable. No focal infiltrate or sizable effusion is seen. No acute bony abnormality is noted. IMPRESSION: No active cardiopulmonary disease. Electronically Signed   By: Alcide Clever M.D.   On: 06/02/2016 14:11      Scheduled Meds: . [START ON 06/04/2016] azithromycin  250 mg Oral Daily  . chlorhexidine  15 mL Mouth Rinse BID  . enoxaparin (LOVENOX) injection  40 mg Subcutaneous Q24H  . folic acid  1 mg Oral Daily  . LORazepam  0-4 mg Intravenous Q6H   Followed by  . [START ON 06/04/2016] LORazepam  0-4 mg Intravenous Q12H  . magnesium oxide  400 mg Oral Daily  . mouth rinse  15 mL Mouth Rinse q12n4p  . multivitamin with minerals  1 tablet Oral Daily  . nicotine  21 mg Transdermal Daily  . sodium chloride flush  3 mL Intravenous Q12H  . thiamine  100 mg Oral Daily   Or  . thiamine  100 mg Intravenous Daily   Continuous  Infusions: . sodium chloride 100 mL/hr at 06/03/16 0230     LOS: 1 day    Time spent in minutes: 35    Calvert Cantor, MD [facility] Pager: [email or web address] Password [email or web address] 06/03/2016, 2:10 PM

## 2016-06-03 NOTE — Clinical Social Work Note (Signed)
Clinical Social Work Assessment  Patient Details  Name: Jake [name] MRN: [id number] Date of Birth: 1970-09-22  Date of referral:  06/03/16               Reason for consult:  [employer], Housing Concerns/Homelessness                Permission sought to share information with:  [place] granted to share information::  No  Name::        Agency::     Relationship::     Contact Information:     Housing/Transportation Living arrangements for the past 2 months:  Homeless Source of Information:  Patient Patient Interpreter Needed:  None Criminal Activity/Legal Involvement Pertinent to Current Situation/Hospitalization:  No - Comment as needed Significant Relationships:  Warehouse manager Lives with:  Self Do you feel safe going back to the place where you live?  Yes Need for family participation in patient care:  No (Coment)  Care giving concerns: Homeless, Alcohol use. " Tired of Drinking- I want to detox."   Social Worker assessment / plan:  CSW met with patient at bedside, explain role and reason for visit. Patient reports he has been homeless since January of this year. Patient reports he has managed to learn different resources in the community such as the [employer] where he goes daily for assistance. Patient report he has been working with [employer] program to get housing. He has an appointment Thursday at 7:15am for an initial assessment. 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  Patient reports a history of several detox/ residential treatment facilities including [facility], [facility], [facility], [facility] etc. Patient feels he continues to relapse. Patient reports he recently started going to [place] meeting at [employer] and plans to continue.  Patient thanked CSW for resources but reports he is familiar with most services. Patient asked for bus pass.  CSW left bus pass on patient  chart. RN informed.   Employment status:    Insurance information:  Self Pay (Medicaid Pending) PT Recommendations:  24 Hour Supervision Information / Referral to community resources:  Shelter, Outpatient Substance Abuse Treatment Options, Residential Substance Abuse Treatment Options  Patient/Family's Response to care: Agreeable and responding well to care.   Patient/Family's Understanding of and Emotional Response to Diagnosis, Current Treatment, and Prognosis: " I have a few things set up for myself. I work when I can, I just need to stop drinking." Patient understands he is getting treatment for detox and plans to follow up with AA group. Patient reports he has no family.  Emotional Assessment Appearance:  Developmentally appropriate Attitude/Demeanor/Rapport:    Affect (typically observed):  Calm, Pleasant Orientation:  Oriented to Self, Oriented to Place, Oriented to  Time, Oriented to Situation Alcohol / Substance use:  Alcohol Use Psych involvement (Current and /or in the community):  No (Comment)  Discharge Needs  Concerns to be addressed:    Readmission within the last 30 days:  Yes Current discharge risk:  Homeless Barriers to Discharge:  Continued Medical Work up   [employer], LCSW 06/03/2016, 11:59 AM

## 2016-06-03 NOTE — Progress Notes (Signed)
Initial Nutrition Assessment  DOCUMENTATION CODES:   Severe malnutrition in context of chronic illness  INTERVENTION:   Ensure Enlive po TID, each supplement provides 350 kcal and 20 grams of protein  MVI  NUTRITION DIAGNOSIS:   Malnutrition (severe) related to other (see comment) (chronic etoh abuse) as evidenced by severe depletion of body fat, severe depletion of muscle mass.  GOAL:   Patient will meet greater than or equal to 90% of their needs  MONITOR:   PO intake, Supplement acceptance, Labs, Weight trends  REASON FOR ASSESSMENT:   Other (Comment) (low BMI)    ASSESSMENT:   46 y.o. male with medical history of alcohol abuse, tobacco abuse, marijuana abuse, homeless, who presented to the ED as his counselor suspected that he would have a seizure form withdrawal.    Met with pt in room today. Pt reports good appetite and oral intake pta. Pt is homeless and does not always have access to food. Pt reports that he is trying to quit drinking. Pt is currently eating 75-100% meals. Pt likes Ensure; RD will order. Per chart, pt has lost 8% of his body weight in 4 months; this is not significant given history. Continue to encourage intake of meals and supplements. Pt with low magnesium; monitor and supplement as needed per MD discretion.   Medications reviewed and include: lovenox, folic acid, Mg oxide, MVI, nicotine, thiamine  Labs reviewed: Na 134(L), Ca 8.4(L), P 3.2 wnl, Mg 1.5(L)  Nutrition-Focused physical exam completed. Findings are severe fat depletion in arms and chest, severe muscle depletion over entire body, and no edema.   Diet Order:  Diet regular Room service appropriate? Yes; Fluid consistency: Thin  Skin:  Reviewed, no issues  Last BM:  5/22  Height:   Ht Readings from Last 1 Encounters:  06/02/16 6' 1"  (1.854 m)    Weight:   Wt Readings from Last 1 Encounters:  06/02/16 132 lb 11.5 oz (60.2 kg)    Ideal Body Weight:  83.6 kg  BMI:  Body  mass index is 17.51 kg/m.  Estimated Nutritional Needs:   Kcal:  1800-2100kcal/day   Protein:  90-102g/day   Fluid:  >1.8L/day   EDUCATION NEEDS:   Education needs no appropriate at this time  Koleen Distance MS, RD, LDN Pager #- [phone]

## 2016-06-03 NOTE — Progress Notes (Signed)
CSW received inappropriate consult to assist with insurance/hopsital bill. Please consult financial counselor Tobi Bastos-Anna at [phone].

## 2016-06-03 NOTE — Care Management Note (Signed)
Case Management Note  Patient Details  Name: Desmond Lopenthony L Winterbottom MRN: [id number] Date of Birth: 04/29/1970  Subjective/Objective:                  46 y.o. male with medical history significant of long-standing alcohol abuse, tobacco abuse who presented to the ED because he is tired of drinking and wants to detox. Per patient, he drinks approximately 1/5 of alcohol (sometimes more) on a daily basis  Action/Plan: Date:  Jun 03, 2016  Chart reviewed for concurrent status and case management needs.  Will continue to follow patient progress.  Discharge Planning: following for needs  Expected discharge date: [id number]  Marcelle SmilingRhonda Akera Snowberger, BSN, [place]RN3, [place]CCM   [phone]   Expected Discharge Date:   (unknown)               Expected Discharge Plan:  Home/Self Care  In-House Referral:  Clinical Social Work  Discharge planning Services  CM Consult  Post Acute Care Choice:    Choice offered to:     DME Arranged:    DME Agency:     HH Arranged:    HH Agency:     Status of Service:  In process, will continue to follow  If discussed at Long Length of Stay Meetings, dates discussed:    Additional Comments:  Golda AcreDavis, Judeen Geralds Lynn, RN 06/03/2016, 9:02 AM

## 2016-06-04 DIAGNOSIS — F10232 Alcohol dependence with withdrawal with perceptual disturbance: Principal | ICD-10-CM

## 2016-06-04 DIAGNOSIS — F10239 Alcohol dependence with withdrawal, unspecified: Secondary | ICD-10-CM

## 2016-06-04 LAB — BASIC METABOLIC PANEL
Anion gap: 7 (ref 5–15)
BUN: 10 mg/dL (ref 6–20)
CALCIUM: 8.7 mg/dL — AB (ref 8.9–10.3)
CO2: 26 mmol/L (ref 22–32)
CREATININE: 0.66 mg/dL (ref 0.61–1.24)
Chloride: 101 mmol/L (ref 101–111)
Glucose, Bld: 110 mg/dL — ABNORMAL HIGH (ref 65–99)
Potassium: 3.9 mmol/L (ref 3.5–5.1)
SODIUM: 134 mmol/L — AB (ref 135–145)

## 2016-06-04 LAB — MAGNESIUM: MAGNESIUM: 1.7 mg/dL (ref 1.7–2.4)

## 2016-06-04 NOTE — Consult Note (Signed)
Duplicate CSW consult for [facility] resources. Please see CSW assessment 06/03/16 for details.   Dellie BurnsJosie Ruweyda Macknight, MSW, LCSW (coverage)

## 2016-06-04 NOTE — Progress Notes (Signed)
PROGRESS NOTE    Jake [name]   ZOX:[id number]  DOB: [birth year]  DOA: 06/02/2016 PCP: Patient, No Pcp Per   Brief Narrative:  Jake [name] is a 46 y.o. male with medical history of alcohol abuse, tobacco abuse, marijuana abuse who presented to the ED as his counselor suspected that he would have a seizure form withdrawal.   Subjective: Cough improved.  Per nurse report patient had a lot tremors last night.  Required 2 mg Ativan this morning   Assessment & Plan:  Alcohol withdrawal- alcohol abuse - cont CIWA scale -SW consulted.  -hopefully discharge in 24 hours.    Hypomagnesemia - replaced On oral  supplements.     Tobacco abuse - Nicotine patch    Acute bronchitis Continue with azithromycin - PRN Nebs - ? underlying COPD  Improved.     Marijuana abuse  Homeless SW, CM consulted.    DVT prophylaxis: Lovenox Code Status: Full code Family Communication:  Disposition Plan: discharge in 24 hours.  Consultants:    Procedures:    Antimicrobials:  Anti-infectives    Start     Dose/Rate Route Frequency Ordered Stop   06/04/16 1000  azithromycin (ZITHROMAX) tablet 250 mg     250 mg Oral Daily 06/03/16 0937 06/08/16 0959   06/03/16 1030  azithromycin (ZITHROMAX) tablet 500 mg     500 mg Oral Daily 06/03/16 0937 06/03/16 1046       Objective: Vitals:   06/03/16 1305 06/04/16 0019 06/04/16 0613 06/04/16 1428  BP: (!) 144/70 (!) 148/86 131/89 130/89  Pulse: 86  65 79  Resp: 16  18 18   Temp: 98.4 F (36.9 C) 97.9 F (36.6 C) 98.6 F (37 C) 98.5 F (36.9 C)  TempSrc: Oral Oral Oral Oral  SpO2: 95% 96% 97% 96%  Weight:      Height:        Intake/Output Summary (Last 24 hours) at 06/04/16 1450 Last data filed at 06/04/16 1314  Gross per 24 hour  Intake          1876.33 ml  Output              950 ml  Net           926.33 ml   Filed Weights   06/02/16 1306 06/02/16 1607  Weight: 63.5 kg (140 lb) 60.2 kg (132 lb 11.5 oz)     Examination: General exam: NAD, mild tremors.  HEENT: PERRLA, oral mucosa moist, no sclera icterus or thrush Respiratory system: CTA Cardiovascular system: S q, S 2 RRR Gastrointestinal system: Abdomen soft, non-tender, nondistended. Normal bowel sound. No organomegaly Central nervous system: Alert and oriented. No focal neurological deficits. Extremities: No cyanosis, clubbing or edema Skin: No rashes or ulcers     Data Reviewed: I have personally reviewed following labs and imaging studies  CBC:  Recent Labs Lab 06/02/16 1349 06/03/16 0543  WBC 11.9* 9.1  HGB 15.7 15.0  HCT 45.1 44.5  MCV 86.7 87.3  PLT 171 168   Basic Metabolic Panel:  Recent Labs Lab 06/02/16 1349 06/03/16 0543 06/04/16 0620  NA 136 134* 134*  K 4.1 3.8 3.9  CL 98* 102 101  CO2 28 25 26   GLUCOSE 107* 102* 110*  BUN 9 11 10   CREATININE 0.65 0.67 0.66  CALCIUM 9.4 8.4* 8.7*  MG  --  1.5* 1.7  PHOS  --  3.2  --    GFR: Estimated Creatinine Clearance: 99.3 mL/min (by C-G formula  based on SCr of 0.66 mg/dL). Liver Function Tests:  Recent Labs Lab 06/02/16 1349  AST 141*  ALT 104*  ALKPHOS 114  BILITOT 0.6  PROT 7.6  ALBUMIN 3.9   No results for input(s): LIPASE, AMYLASE in the last 168 hours. No results for input(s): AMMONIA in the last 168 hours. Coagulation Profile: No results for input(s): INR, PROTIME in the last 168 hours. Cardiac Enzymes: No results for input(s): CKTOTAL, CKMB, CKMBINDEX, TROPONINI in the last 168 hours. BNP (last 3 results) No results for input(s): PROBNP in the last 8760 hours. HbA1C: No results for input(s): HGBA1C in the last 72 hours. CBG: No results for input(s): GLUCAP in the last 168 hours. Lipid Profile: No results for input(s): CHOL, HDL, LDLCALC, TRIG, CHOLHDL, LDLDIRECT in the last 72 hours. Thyroid Function Tests: No results for input(s): TSH, T4TOTAL, FREET4, T3FREE, THYROIDAB in the last 72 hours. Anemia Panel: No results for  input(s): VITAMINB12, FOLATE, FERRITIN, TIBC, IRON, RETICCTPCT in the last 72 hours. Urine analysis:    Component Value Date/Time   COLORURINE AMBER (A) 01/25/2016 0844   APPEARANCEUR CLEAR 01/25/2016 0844   LABSPEC 1.019 01/25/2016 0844   PHURINE 5.0 01/25/2016 0844   GLUCOSEU NEGATIVE 01/25/2016 0844   HGBUR NEGATIVE 01/25/2016 0844   BILIRUBINUR NEGATIVE 01/25/2016 0844   KETONESUR 20 (A) 01/25/2016 0844   PROTEINUR 30 (A) 01/25/2016 0844   UROBILINOGEN 4.0 (H) 10/24/2013 1236   NITRITE NEGATIVE 01/25/2016 0844   LEUKOCYTESUR NEGATIVE 01/25/2016 0844   Sepsis Labs: @LABRCNTIP (procalcitonin:4,lacticidven:4) )No results found for this or any previous visit (from the past 240 hour(s)).       Radiology Studies: No results found.    Scheduled Meds: . azithromycin  250 mg Oral Daily  . chlorhexidine  15 mL Mouth Rinse BID  . enoxaparin (LOVENOX) injection  40 mg Subcutaneous Q24H  . feeding supplement (ENSURE ENLIVE)  237 mL Oral TID BM  . folic acid  1 mg Oral Daily  . LORazepam  0-4 mg Intravenous Q12H  . magnesium oxide  400 mg Oral Daily  . mouth rinse  15 mL Mouth Rinse q12n4p  . multivitamin with minerals  1 tablet Oral Daily  . nicotine  21 mg Transdermal Daily  . sodium chloride flush  3 mL Intravenous Q12H  . thiamine  100 mg Oral Daily   Or  . thiamine  100 mg Intravenous Daily   Continuous Infusions:    LOS: 2 days    Time spent in minutes: 35    Allien Melberg A, MD [facility] [phone] [email or web address] Password [email or web address] 06/04/2016, 2:50 PM

## 2016-06-05 DIAGNOSIS — F17210 Nicotine dependence, cigarettes, uncomplicated: Secondary | ICD-10-CM

## 2016-06-05 DIAGNOSIS — J209 Acute bronchitis, unspecified: Secondary | ICD-10-CM

## 2016-06-05 NOTE — Progress Notes (Signed)
PROGRESS NOTE    Jake Lopenthony L Rauf   GNF:[id number]  DOB: 08/02/1970  DOA: 06/02/2016 PCP: Patient, No Pcp Per   Brief Narrative:  Jake [name] is a 46 y.o. male with medical history of alcohol abuse, tobacco abuse, marijuana abuse who presented to the ED as his counselor suspected that he would have a seizure form withdrawal.   Subjective: Anxious this morning, tremors.    Assessment & Plan:  Alcohol withdrawal- alcohol abuse - cont CIWA scale -SW consulted.  -tremors this morning, anxious.  Will ask psych for evaluation for inpatient rehab-alcohol program    Hypomagnesemia - replaced On oral  supplements.     Tobacco abuse - Nicotine patch    Acute bronchitis Continue with azithromycin - PRN Nebs - ? underlying COPD  Improved.     Marijuana abuse  Homeless SW, CM consulted.    DVT prophylaxis: Lovenox Code Status: Full code Family Communication:  Disposition Plan: discharge in 24 hours.  Consultants: Psych    Procedures:    Antimicrobials:  Anti-infectives    Start     Dose/Rate Route Frequency Ordered Stop   06/04/16 1000  azithromycin (ZITHROMAX) tablet 250 mg     250 mg Oral Daily 06/03/16 0937 06/08/16 0959   06/03/16 1030  azithromycin (ZITHROMAX) tablet 500 mg     500 mg Oral Daily 06/03/16 0937 06/03/16 1046       Objective: Vitals:   06/04/16 2134 06/05/16 0623 06/05/16 0820 06/05/16 1334  BP: 131/87 126/82 (!) 134/91 (!) 144/96  Pulse: 79 75 71 84  Resp: 18 18 18 16   Temp: 98.8 F (37.1 C) 98.6 F (37 C) 97.8 F (36.6 C) 98.4 F (36.9 C)  TempSrc: Oral Oral Oral Oral  SpO2: 95% 98% 99% 98%  Weight:      Height:        Intake/Output Summary (Last 24 hours) at 06/05/16 1405 Last data filed at 06/05/16 1300  Gross per 24 hour  Intake              720 ml  Output                0 ml  Net              720 ml   Filed Weights   06/02/16 1306 06/02/16 1607  Weight: 63.5 kg (140 lb) 60.2 kg (132 lb 11.5 oz)     Examination: General exam: NAD, tremors.   HEENT: PERRLA, oral mucosa moist, no sclera icterus or thrush Respiratory system: CTA Cardiovascular system; S 1, S 2 RRR Gastrointestinal system: Abdomen soft, non-tender, nondistended. Normal bowel sound. No organomegaly Central nervous system: Alert and oriented. No focal neurological deficits. Extremities: No cyanosis, clubbing or edema Skin: No rashes or ulcers     Data Reviewed: I have personally reviewed following labs and imaging studies  CBC:  Recent Labs Lab 06/02/16 1349 06/03/16 0543  WBC 11.9* 9.1  HGB 15.7 15.0  HCT 45.1 44.5  MCV 86.7 87.3  PLT 171 168   Basic Metabolic Panel:  Recent Labs Lab 06/02/16 1349 06/03/16 0543 06/04/16 0620  NA 136 134* 134*  K 4.1 3.8 3.9  CL 98* 102 101  CO2 28 25 26   GLUCOSE 107* 102* 110*  BUN 9 11 10   CREATININE 0.65 0.67 0.66  CALCIUM 9.4 8.4* 8.7*  MG  --  1.5* 1.7  PHOS  --  3.2  --    GFR: Estimated Creatinine Clearance: 99.3  mL/min (by C-G formula based on SCr of 0.66 mg/dL). Liver Function Tests:  Recent Labs Lab 06/02/16 1349  AST 141*  ALT 104*  ALKPHOS 114  BILITOT 0.6  PROT 7.6  ALBUMIN 3.9   No results for input(s): LIPASE, AMYLASE in the last 168 hours. No results for input(s): AMMONIA in the last 168 hours. Coagulation Profile: No results for input(s): INR, PROTIME in the last 168 hours. Cardiac Enzymes: No results for input(s): CKTOTAL, CKMB, CKMBINDEX, TROPONINI in the last 168 hours. BNP (last 3 results) No results for input(s): PROBNP in the last 8760 hours. HbA1C: No results for input(s): HGBA1C in the last 72 hours. CBG: No results for input(s): GLUCAP in the last 168 hours. Lipid Profile: No results for input(s): CHOL, HDL, LDLCALC, TRIG, CHOLHDL, LDLDIRECT in the last 72 hours. Thyroid Function Tests: No results for input(s): TSH, T4TOTAL, FREET4, T3FREE, THYROIDAB in the last 72 hours. Anemia Panel: No results for input(s):  VITAMINB12, FOLATE, FERRITIN, TIBC, IRON, RETICCTPCT in the last 72 hours. Urine analysis:    Component Value Date/Time   COLORURINE AMBER (A) 01/25/2016 0844   APPEARANCEUR CLEAR 01/25/2016 0844   LABSPEC 1.019 01/25/2016 0844   PHURINE 5.0 01/25/2016 0844   GLUCOSEU NEGATIVE 01/25/2016 0844   HGBUR NEGATIVE 01/25/2016 0844   BILIRUBINUR NEGATIVE 01/25/2016 0844   KETONESUR 20 (A) 01/25/2016 0844   PROTEINUR 30 (A) 01/25/2016 0844   UROBILINOGEN 4.0 (H) 10/24/2013 1236   NITRITE NEGATIVE 01/25/2016 0844   LEUKOCYTESUR NEGATIVE 01/25/2016 0844   Sepsis Labs: @LABRCNTIP (procalcitonin:4,lacticidven:4) )No results found for this or any previous visit (from the past 240 hour(s)).       Radiology Studies: No results found.    Scheduled Meds: . azithromycin  250 mg Oral Daily  . chlorhexidine  15 mL Mouth Rinse BID  . enoxaparin (LOVENOX) injection  40 mg Subcutaneous Q24H  . feeding supplement (ENSURE ENLIVE)  237 mL Oral TID BM  . folic acid  1 mg Oral Daily  . LORazepam  0-4 mg Intravenous Q12H  . magnesium oxide  400 mg Oral Daily  . mouth rinse  15 mL Mouth Rinse q12n4p  . multivitamin with minerals  1 tablet Oral Daily  . nicotine  21 mg Transdermal Daily  . sodium chloride flush  3 mL Intravenous Q12H  . thiamine  100 mg Oral Daily   Or  . thiamine  100 mg Intravenous Daily   Continuous Infusions:    LOS: 3 days    Time spent in minutes: 35    Clemens Lachman A, MD [facility] Pager:[phone] [email or web address] Password [email or web address] 06/05/2016, 2:05 PM

## 2016-06-05 NOTE — Progress Notes (Signed)
CSW following for SA needs. Spoke with psychiatrist. Pt in need of information for IOP program to address alcohol use. Met with pt at bedside. Pt alert/oriented x4, pleasant and appreciative of psychiatry and CSW involvement. CSW provided pt with information for [facility] which does offer IOP for pts who are uninsured. Advised of walk-in clinic hours for intake/triage and program assignment.  States he is appreciative of psychiatry and CSW involvement.  Sharren Bridge, MSW, LCSW Clinical Social Work 06/05/2016 [phone]

## 2016-06-05 NOTE — Consult Note (Signed)
[facility] Face-to-Face Psychiatry Consult   Reason for Consult:  Etoh withdrawal Referring Physician:  Dr. Tyrell Antonio Patient Identification: Jake [name] MRN:  [id number] Principal Diagnosis: Alcohol withdrawal Jake [name]) Diagnosis:   Patient Active Problem List   Diagnosis Date Noted  . Acute bronchitis [J20.9] 06/03/2016  . Homeless [Z59.0] 06/03/2016  . Alcohol withdrawal ([place]) [F10.239] 06/02/2016  . Tobacco abuse [Z72.0] 06/02/2016  . Protein-calorie malnutrition, severe ([place]) [E43] 07/10/2015  . Transaminitis [R74.0] 07/07/2015  . Hypokalemia [E87.6] 07/07/2015  . Alcohol withdrawal delirium, acute, mixed level of activity ([place]) [F10.231] 07/07/2015  . Severe recurrent major depression without psychotic features ([place]) [F33.2] 04/10/2015  . Alcohol-induced mood disorder ([place]) [F10.94] 04/06/2015  . Alcohol dependence with alcohol-induced mood disorder ([place]) [F10.24] 03/09/2014  . Polysubstance abuse [F19.10] 03/09/2014  . Opioid dependence ([place]) [F11.20] 03/09/2014  . Alcohol use disorder, severe, dependence ([place]) [F10.20] 03/08/2014  . Substance induced mood disorder ([place]) [F19.94] 03/08/2014  . Alcohol abuse [F10.10] 06/28/2013  . Alcohol withdrawal syndrome without complication ([place]) [H41.740] 06/28/2013  . DTs (delirium tremens) ([place]) [F10.231] 06/28/2013  . Marijuana abuse [F12.10] 04/28/2011  . Alcoholism ([place]) [F10.20] 04/27/2011  . Toxic encephalopathy [G92] 04/27/2011  . Psychosis [F29] 04/27/2011  . Leukocytosis [D72.829] 04/27/2011  . Hyponatremia [E87.1] 04/27/2011  . Thrombocytopenia ([place]) [D69.6] 04/27/2011  . Elevated AST (SGOT) [R74.0] 04/27/2011  . Elevated bilirubin [R17] 04/27/2011    Total Time spent with patient: 1 hour  Subjective:   Jake [name] is a 46 y.o. male patient admitted with alcohol abuse.  HPI:  Jake [name] is a 46 y.o. male, seen, chart reviewed and case discussed with the Dr. Tyrell Antonio and also psych CSW for this w face-to-face  psychiatric consultation and evaluation of alcohol use disorder and detox treatment. Patient reported has been drinking alcohol since he was young and currently isn't drinking one fifth of vodka daily and recently received counseling services from [place] and decided to stop drinking and find it is hard for himself outside the [name] so he decided to come to the [name] to requested detox treatment. Patient stated he does not want to get worse and his drinking habit or worried about having alcoholic cirrhosis. Patient has history of alcohol withdrawal seizures in the past. Patient has been fairly doing well with his current detox treatment except mild hand tremors and low-grade extremity cramps. Patient is a mild sweating but no nausea, vomiting, chest pain or shortness of breath. Patient stated he does not have any suicidal ideation, homicidal ideation, intention or plans. Patient has no evidence of alcohol withdrawal psychosis. Patient denied auditory/visual hallucinations, delusions and paranoia. Patient is seeking for substance in abuse intensive outpatient treatment program and also willing to participate in alcoholic anonymous and finding sponsor in near future. Patient urine drug screen is positive for tetrahydrocannabinol and blood alcohol level is 86 on arrival.  Past Psychiatric History: Alcohol dependence  Risk to Self: Is patient at risk for suicide?: No Risk to Others:   Prior Inpatient Therapy:   Prior Outpatient Therapy:    Past Medical History:  Past Medical History:  Diagnosis Date  . Anxiety   . Depression   . Elevated liver enzymes   . ETOH abuse   . Hip dislocation   . Seizures ([place])    History reviewed. No pertinent surgical history. Family History:  Family History  Problem Relation Age of Onset  . Heart attack Father    Family Psychiatric  History: Unknown  Social History:  History  Alcohol Use  . 4.8 oz/week  . 3 Cans of beer, 5 Shots of liquor per week     Comment: daily     History  Drug Use No    Comment: denies any drug use since he was seen for a heroin overdose on 07/07/15     Social History   Social History  . Marital status: Single    Spouse name: N/A  . Number of children: N/A  . Years of education: N/A   Social History Main Topics  . Smoking status: Current Every Day Smoker    Packs/day: 2.00    Types: Cigarettes  . Smokeless tobacco: Never Used  . Alcohol use 4.8 oz/week    3 Cans of beer, 5 Shots of liquor per week     Comment: daily  . Drug use: No     Comment: denies any drug use since he was seen for a heroin overdose on 07/07/15   . Sexual activity: Yes   Other Topics Concern  . None   Social History Narrative  . None   Additional Social History:    Allergies:  No Known Allergies  Labs:  Results for orders placed or performed during the [name] encounter of 06/02/16 (from the past 48 hour(s))  Basic metabolic panel     Status: Abnormal   Collection Time: 06/04/16  6:20 AM  Result Value Ref Range   Sodium 134 (L) 135 - 145 mmol/L   Potassium 3.9 3.5 - 5.1 mmol/L   Chloride 101 101 - 111 mmol/L   CO2 26 22 - 32 mmol/L   Glucose, Bld 110 (H) 65 - 99 mg/dL   BUN 10 6 - 20 mg/dL   Creatinine, Ser 0.66 0.61 - 1.24 mg/dL   Calcium 8.7 (L) 8.9 - 10.3 mg/dL   GFR calc non Af Amer >60 >60 mL/min   GFR calc Af Amer >60 >60 mL/min    Comment: (NOTE) The eGFR has been calculated using the CKD EPI equation. This calculation has not been validated in all clinical situations. eGFR's persistently <60 mL/min signify possible Chronic Kidney Disease.    Anion gap 7 5 - 15  Magnesium     Status: None   Collection Time: 06/04/16  6:20 AM  Result Value Ref Range   Magnesium 1.7 1.7 - 2.4 mg/dL    Current Facility-Administered Medications  Medication Dose Route Frequency Provider Last Rate Last Dose  . acetaminophen (TYLENOL) tablet 650 mg  650 mg Oral Q6H PRN Jonetta Osgood, MD   650 mg at 06/04/16 0028    Or  . acetaminophen (TYLENOL) suppository 650 mg  650 mg Rectal Q6H PRN Jonetta Osgood, MD      . albuterol (PROVENTIL) (2.5 MG/3ML) 0.083% nebulizer solution 2.5 mg  2.5 mg Nebulization Q2H PRN Ghimire, Henreitta Leber, MD      . azithromycin (ZITHROMAX) tablet 250 mg  250 mg Oral Daily Debbe Odea, MD   250 mg at 06/04/16 1304  . chlorhexidine (PERIDEX) 0.12 % solution 15 mL  15 mL Mouth Rinse BID Jonetta Osgood, MD   15 mL at 06/04/16 2127  . enoxaparin (LOVENOX) injection 40 mg  40 mg Subcutaneous Q24H Jonetta Osgood, MD   40 mg at 06/04/16 2127  . feeding supplement (ENSURE ENLIVE) (ENSURE ENLIVE) liquid 237 mL  237 mL Oral TID BM Debbe Odea, MD   237 mL at 06/04/16 1938  . folic acid (FOLVITE) tablet 1 mg  1 mg  Oral Daily Jonetta Osgood, MD   1 mg at 06/04/16 1313  . labetalol (NORMODYNE,TRANDATE) injection 10 mg  10 mg Intravenous Q4H PRN Jonetta Osgood, MD      . LORazepam (ATIVAN) injection 0-4 mg  0-4 mg Intravenous Q12H Minda Ditto, RPH   2 mg at 06/05/16 0024  . LORazepam (ATIVAN) tablet 1 mg  1 mg Oral Q6H PRN Jonetta Osgood, MD   1 mg at 06/03/16 1109   Or  . LORazepam (ATIVAN) injection 1 mg  1 mg Intravenous Q6H PRN Jonetta Osgood, MD   1 mg at 06/05/16 0813  . magnesium oxide (MAG-OX) tablet 400 mg  400 mg Oral Daily Debbe Odea, MD   400 mg at 06/04/16 1304  . MEDLINE mouth rinse  15 mL Mouth Rinse q12n4p Jonetta Osgood, MD   15 mL at 06/03/16 1200  . multivitamin with minerals tablet 1 tablet  1 tablet Oral Daily Jonetta Osgood, MD   1 tablet at 06/04/16 1304  . nicotine (NICODERM CQ - dosed in mg/24 hours) patch 21 mg  21 mg Transdermal Daily Jonetta Osgood, MD   21 mg at 06/04/16 0901  . ondansetron (ZOFRAN) tablet 4 mg  4 mg Oral Q6H PRN Ghimire, Henreitta Leber, MD       Or  . ondansetron (ZOFRAN) injection 4 mg  4 mg Intravenous Q6H PRN Ghimire, Henreitta Leber, MD      . senna-docusate (Senokot-S) tablet 1 tablet  1 tablet Oral QHS PRN  Jonetta Osgood, MD      . sodium chloride flush (NS) 0.9 % injection 3 mL  3 mL Intravenous Q12H Jonetta Osgood, MD   3 mL at 06/04/16 2241  . thiamine (VITAMIN B-1) tablet 100 mg  100 mg Oral Daily Jonetta Osgood, MD   100 mg at 06/04/16 1305   Or  . thiamine (B-1) injection 100 mg  100 mg Intravenous Daily Ghimire, Henreitta Leber, MD        Musculoskeletal: Strength & Muscle Tone: decreased Gait & Station: unable to stand Patient leans: N/A  Psychiatric Specialty Exam: Physical Exam as per history and physical   ROS mild depression, hand tremors, lower leg cramps and mild sweating, denied nausea, vomiting, abdomen pain, shortness of breath and chest pain.  No Fever-chills, No Headache, No changes with Vision or hearing, reports vertigo No problems swallowing food or Liquids, No Chest pain, Cough or Shortness of Breath, No Abdominal pain, No Nausea or Vommitting, Bowel movements are regular, No Blood in stool or Urine, No dysuria, No new skin rashes or bruises, No new joints pains-aches,  No new weakness, tingling, numbness in any extremity, No recent weight gain or loss, No polyuria, polydypsia or polyphagia,  A full 10 point Review of Systems was done, except as stated above, all other Review of Systems were negative.  Blood pressure (!) 134/91, pulse 71, temperature 97.8 F (36.6 C), temperature source Oral, resp. rate 18, height 6' 1"  (1.854 m), weight 60.2 kg (132 lb 11.5 oz), SpO2 99 %.Body mass index is 17.51 kg/m.  General Appearance: Casual  Eye Contact:  Good  Speech:  Clear and Coherent  Volume:  Decreased  Mood:  Anxious  Affect:  Appropriate and Congruent  Thought Process:  Coherent and Goal Directed  Orientation:  Full (Time, Place, and Person)  Thought Content:  WDL  Suicidal Thoughts:  No  Homicidal Thoughts:  No  Memory:  Immediate;  Good Recent;   Fair Remote;   Fair  Judgement:  Intact  Insight:  Good  Psychomotor Activity:  Decreased   Concentration:  Concentration: Good and Attention Span: Fair  Recall:  Good  Fund of Knowledge:  Good  Language:  Good  Akathisia:  Negative  Handed:  Right  AIMS (if indicated):     Assets:  Communication Skills Desire for Improvement Financial Resources/Insurance Leisure Time Resilience Social Support Transportation  ADL's:  Intact  Cognition:  WNL  Sleep:        Treatment Plan Summary: 46 years old male admitted for alcohol detox treatment and urine drug screen is positive for tetrahydrocannabinol and blood alcohol level is 86 on admission. Patient has alcohol withdrawal symptoms and received good detox treatment which is helpful. Patient denied symptoms of depression, mania, agitation, psychotic symptoms. Patient has no suicidal or homicidal ideation, intention or plans.  Patient has no safety concerns. Continue Ativan detox protocol as scheduled Continue CIWA monitoring Recommended no psychotropic medications Case discussed with a CSW who will refer him to the alcoholic drug services who has intensive outpatient program and also referred to the alcoholic anonymous and counseling with the [facility]  Disposition: No evidence of imminent risk to self or others at present.   Patient does not meet criteria for psychiatric inpatient admission. Supportive therapy provided about ongoing stressors. Refer to IOP. with [facility]  Ambrose Finland, MD 06/05/2016 12:33 PM

## 2016-06-06 DIAGNOSIS — F10230 Alcohol dependence with withdrawal, uncomplicated: Secondary | ICD-10-CM

## 2016-06-06 MED ORDER — ENSURE ENLIVE PO LIQD: 237.0000 mL | Freq: Three times a day (TID) | ORAL | 12 refills | Status: DC

## 2016-06-06 MED ORDER — FOLIC ACID 1 MG PO TABS: 1.0000 mg | ORAL_TABLET | Freq: Every day | ORAL | 0 refills | Status: DC

## 2016-06-06 MED ORDER — LORAZEPAM 2 MG/ML IJ SOLN
1.0000 mg | Freq: Once | INTRAMUSCULAR | Status: AC
  Administered 2016-06-06: 1 mg via INTRAVENOUS
  Filled 2016-06-06: qty 1

## 2016-06-06 MED ORDER — NICOTINE 21 MG/24HR TD PT24: 21.0000 mg | MEDICATED_PATCH | Freq: Every day | TRANSDERMAL | 0 refills | Status: DC

## 2016-06-06 MED ORDER — THIAMINE HCL 100 MG PO TABS: 100.0000 mg | ORAL_TABLET | Freq: Every day | ORAL | 0 refills | Status: DC

## 2016-06-06 MED ORDER — AZITHROMYCIN 250 MG PO TABS: ORAL_TABLET | ORAL | 0 refills | Status: DC

## 2016-06-06 NOTE — Progress Notes (Signed)
Date: Jun 06, 2016 Discharge orders review for case management needs.  None found Rhonda Davis, BSN, RN3, CCM: [phone] 

## 2016-06-06 NOTE — Discharge Summary (Signed)
Physician Discharge Summary  Jake [name] ZOX:[id number] DOB: 12/05/1970 DOA: 06/02/2016  PCP: Patient, No Pcp Per  Admit date: 06/02/2016 Discharge date: 06/06/2016  Admitted From: Home  Disposition:  Shelter   Recommendations for Outpatient Follow-up:  1. Follow up with PCP in 1-2 weeks 2. Please obtain BMP/CBC in one week   Discharge Condition: stable.  CODE STATUS: Full code.  Diet recommendation: Heart Healthy   Brief/Interim Summary: Jake Nailernthony L Turneris a 46 y.o.malewith medical history of alcohol abuse, tobacco abuse, marijuana abuse who presented to the ED as his counselor suspected that he would have a seizure form withdrawal.    Assessment & Plan:  Alcohol withdrawal- alcohol abuse - cont CIWA scale -SW consulted.  -tremors this morning, anxious.  Psych clear him for discharge. Outpatient resource was provided to the patient   Hypomagnesemia - replaced On oral  supplements.     Tobacco abuse - Nicotine patch    Acute bronchitis Day 4/5 azithromycin - PRN Nebs - ? underlying COPD  Improved.     Marijuana abuse  Homeless SW, CM consulted.   Discharge Diagnoses:  Principal Problem:   Alcohol withdrawal (HCC) Active Problems:   Marijuana abuse   Tobacco abuse   Acute bronchitis   Homeless    Discharge Instructions  Discharge Instructions    Diet - low sodium heart healthy    Complete by:  As directed    Increase activity slowly    Complete by:  As directed      Allergies as of 06/06/2016   No Known Allergies     Medication List    STOP taking these medications   hydrOXYzine 25 MG tablet Commonly known as:  ATARAX/VISTARIL   promethazine 25 MG tablet Commonly known as:  PHENERGAN     TAKE these medications   azithromycin 250 MG tablet Commonly known as:  ZITHROMAX Take one table. Start taking on:  06/07/2016   feeding supplement (ENSURE ENLIVE) Liqd Take 237 mLs by mouth 3 (three) times daily between meals.    folic acid 1 MG tablet Commonly known as:  FOLVITE Take 1 tablet (1 mg total) by mouth daily.   multivitamin with minerals Tabs tablet Take 1 tablet by mouth daily. For low Vitamin   nicotine 21 mg/24hr patch Commonly known as:  NICODERM CQ - dosed in mg/24 hours Place 1 patch (21 mg total) onto the skin daily.   thiamine 100 MG tablet Take 1 tablet (100 mg total) by mouth daily.   traZODone 100 MG tablet Commonly known as:  DESYREL Take 1 tablet (100 mg total) by mouth at bedtime and may repeat dose one time if needed. For sleep       No Known Allergies  Consultations:  none   Procedures/Studies: Dg Chest 2 View  Result Date: 06/02/2016 CLINICAL DATA:  Right-sided chest pain EXAM: CHEST  2 VIEW COMPARISON:  01/25/2016 FINDINGS: Cardiac shadow is within normal limits. The lungs are well aerated bilaterally. Mild interstitial changes are again seen and stable. No focal infiltrate or sizable effusion is seen. No acute bony abnormality is noted. IMPRESSION: No active cardiopulmonary disease. Electronically Signed   By: Jake [name] M.D.   On: 06/02/2016 14:11      Subjective: Cough better   Discharge Exam: Vitals:   06/05/16 2036 06/06/16 0631  BP: 137/90 (!) 130/93  Pulse: 77 73  Resp: 16 18  Temp: 98.5 F (36.9 C) 98 F (36.7 C)   Vitals:   06/05/16 0820  06/05/16 1334 06/05/16 2036 06/06/16 0631  BP: (!) 134/91 (!) 144/96 137/90 (!) 130/93  Pulse: 71 84 77 73  Resp: 18 16 16 18   Temp: 97.8 F (36.6 C) 98.4 F (36.9 C) 98.5 F (36.9 C) 98 F (36.7 C)  TempSrc: Oral Oral Oral Oral  SpO2: 99% 98% 96% 96%  Weight:      Height:        General: Pt is alert, awake, not in acute distress Cardiovascular: RRR, S1/S2 +, no rubs, no gallops Respiratory: CTA bilaterally, no wheezing, no rhonchi Abdominal: Soft, NT, ND, bowel sounds + Extremities: no edema, no cyanosis    The results of significant diagnostics from this hospitalization (including imaging,  microbiology, ancillary and laboratory) are listed below for reference.     Microbiology: No results found for this or any previous visit (from the past 240 hour(s)).   Labs: BNP (last 3 results) No results for input(s): BNP in the last 8760 hours. Basic Metabolic Panel:  Recent Labs Lab 06/02/16 1349 06/03/16 0543 06/04/16 0620  NA 136 134* 134*  K 4.1 3.8 3.9  CL 98* 102 101  CO2 28 25 26   GLUCOSE 107* 102* 110*  BUN 9 11 10   CREATININE 0.65 0.67 0.66  CALCIUM 9.4 8.4* 8.7*  MG  --  1.5* 1.7  PHOS  --  3.2  --    Liver Function Tests:  Recent Labs Lab 06/02/16 1349  AST 141*  ALT 104*  ALKPHOS 114  BILITOT 0.6  PROT 7.6  ALBUMIN 3.9   No results for input(s): LIPASE, AMYLASE in the last 168 hours. No results for input(s): AMMONIA in the last 168 hours. CBC:  Recent Labs Lab 06/02/16 1349 06/03/16 0543  WBC 11.9* 9.1  HGB 15.7 15.0  HCT 45.1 44.5  MCV 86.7 87.3  PLT 171 168   Cardiac Enzymes: No results for input(s): CKTOTAL, CKMB, CKMBINDEX, TROPONINI in the last 168 hours. BNP: Invalid input(s): POCBNP CBG: No results for input(s): GLUCAP in the last 168 hours. D-Dimer No results for input(s): DDIMER in the last 72 hours. Hgb A1c No results for input(s): HGBA1C in the last 72 hours. Lipid Profile No results for input(s): CHOL, HDL, LDLCALC, TRIG, CHOLHDL, LDLDIRECT in the last 72 hours. Thyroid function studies No results for input(s): TSH, T4TOTAL, T3FREE, THYROIDAB in the last 72 hours.  Invalid input(s): FREET3 Anemia work up No results for input(s): VITAMINB12, FOLATE, FERRITIN, TIBC, IRON, RETICCTPCT in the last 72 hours. Urinalysis    Component Value Date/Time   COLORURINE AMBER (A) 01/25/2016 0844   APPEARANCEUR CLEAR 01/25/2016 0844   LABSPEC 1.019 01/25/2016 0844   PHURINE 5.0 01/25/2016 0844   GLUCOSEU NEGATIVE 01/25/2016 0844   HGBUR NEGATIVE 01/25/2016 0844   BILIRUBINUR NEGATIVE 01/25/2016 0844   KETONESUR 20 (A)  01/25/2016 0844   PROTEINUR 30 (A) 01/25/2016 0844   UROBILINOGEN 4.0 (H) 10/24/2013 1236   NITRITE NEGATIVE 01/25/2016 0844   LEUKOCYTESUR NEGATIVE 01/25/2016 0844   Sepsis Labs Invalid input(s): PROCALCITONIN,  WBC,  LACTICIDVEN Microbiology No results found for this or any previous visit (from the past 240 hour(s)).   Time coordinating discharge: Over 30 minutes  SIGNED:   Alba Cory, MD  [facility] 06/06/2016, 10:40 AM Pager [phone]  If 7PM-7AM, please contact night-coverage [email or web address] Password [email or web address]

## 2016-08-18 ENCOUNTER — Emergency Department (HOSPITAL_COMMUNITY)
Admission: EM | Admit: 2016-08-18 | Discharge: 2016-08-18 | Disposition: A | Discharge status: Home / Self Care | Payer: Self-pay | Attending: Emergency Medicine | Admitting: Emergency Medicine

## 2016-08-18 ENCOUNTER — Encounter (HOSPITAL_COMMUNITY): Payer: Self-pay | Admitting: Emergency Medicine

## 2016-08-18 DIAGNOSIS — F1919 Other psychoactive substance abuse with unspecified psychoactive substance-induced disorder: Secondary | ICD-10-CM | POA: Insufficient documentation

## 2016-08-18 DIAGNOSIS — F17210 Nicotine dependence, cigarettes, uncomplicated: Secondary | ICD-10-CM | POA: Insufficient documentation

## 2016-08-18 DIAGNOSIS — F1010 Alcohol abuse, uncomplicated: Secondary | ICD-10-CM

## 2016-08-18 DIAGNOSIS — F1910 Other psychoactive substance abuse, uncomplicated: Secondary | ICD-10-CM

## 2016-08-18 DIAGNOSIS — Z79899 Other long term (current) drug therapy: Secondary | ICD-10-CM | POA: Insufficient documentation

## 2016-08-18 DIAGNOSIS — F1019 Alcohol abuse with unspecified alcohol-induced disorder: Secondary | ICD-10-CM | POA: Insufficient documentation

## 2016-08-18 LAB — COMPREHENSIVE METABOLIC PANEL
ALK PHOS: 92 U/L (ref 38–126)
ALT: 36 U/L (ref 17–63)
AST: 47 U/L — ABNORMAL HIGH (ref 15–41)
Albumin: 4.6 g/dL (ref 3.5–5.0)
Anion gap: 11 (ref 5–15)
BUN: <5 mg/dL — ABNORMAL LOW (ref 6–20)
CALCIUM: 9.3 mg/dL (ref 8.9–10.3)
CO2: 28 mmol/L (ref 22–32)
CREATININE: 0.64 mg/dL (ref 0.61–1.24)
Chloride: 102 mmol/L (ref 101–111)
Glucose, Bld: 86 mg/dL (ref 65–99)
Potassium: 3.9 mmol/L (ref 3.5–5.1)
Sodium: 141 mmol/L (ref 135–145)
Total Bilirubin: 0.6 mg/dL (ref 0.3–1.2)
Total Protein: 8.3 g/dL — ABNORMAL HIGH (ref 6.5–8.1)

## 2016-08-18 LAB — I-STAT CG4 LACTIC ACID, ED: LACTIC ACID, VENOUS: 2.02 mmol/L — AB (ref 0.5–1.9)

## 2016-08-18 LAB — CBC
HCT: 46.3 % (ref 39.0–52.0)
Hemoglobin: 16.6 g/dL (ref 13.0–17.0)
MCH: 31 pg (ref 26.0–34.0)
MCHC: 35.9 g/dL (ref 30.0–36.0)
MCV: 86.5 fL (ref 78.0–100.0)
PLATELETS: 229 10*3/uL (ref 150–400)
RBC: 5.35 MIL/uL (ref 4.22–5.81)
RDW: 13.1 % (ref 11.5–15.5)
WBC: 5.9 10*3/uL (ref 4.0–10.5)

## 2016-08-18 LAB — ETHANOL: ALCOHOL ETHYL (B): 221 mg/dL — AB (ref <5)

## 2016-08-18 MED ORDER — LORAZEPAM 2 MG/ML IJ SOLN
0.0000 mg | Freq: Two times a day (BID) | INTRAMUSCULAR | Status: DC
Start: 2016-08-21 — End: 2016-08-18

## 2016-08-18 MED ORDER — SODIUM CHLORIDE 0.9 % IV BOLUS (SEPSIS)
1000.0000 mL | Freq: Once | INTRAVENOUS | Status: AC
  Administered 2016-08-18: 1000 mL via INTRAVENOUS

## 2016-08-18 MED ORDER — LORAZEPAM 1 MG PO TABS: 0.0000 mg | ORAL_TABLET | Freq: Four times a day (QID) | ORAL | Status: DC

## 2016-08-18 MED ORDER — LORAZEPAM 2 MG/ML IJ SOLN
0.0000 mg | Freq: Four times a day (QID) | INTRAMUSCULAR | Status: DC
  Administered 2016-08-18: 1 mg via INTRAVENOUS
  Filled 2016-08-18: qty 1

## 2016-08-18 MED ORDER — VITAMIN B-1 100 MG PO TABS: 100.0000 mg | ORAL_TABLET | Freq: Every day | ORAL | Status: DC

## 2016-08-18 MED ORDER — CHLORDIAZEPOXIDE HCL 25 MG PO CAPS: ORAL_CAPSULE | ORAL | 0 refills | Status: DC

## 2016-08-18 MED ORDER — LORAZEPAM 1 MG PO TABS
0.0000 mg | ORAL_TABLET | Freq: Two times a day (BID) | ORAL | Status: DC
Start: 2016-08-21 — End: 2016-08-18

## 2016-08-18 MED ORDER — THIAMINE HCL 100 MG/ML IJ SOLN
100.0000 mg | Freq: Every day | INTRAMUSCULAR | Status: DC
  Administered 2016-08-18: 100 mg via INTRAVENOUS
  Filled 2016-08-18: qty 2

## 2016-08-18 NOTE — ED Triage Notes (Signed)
Pt c/o alcohol withdrawal, hx of seizures with alcohol withdrawal. Last drink 6 hours ago. Pt also using cocaine and heroine, last use on Saturday night. Pt drinks about 0.5 gallons of vodka per day plus about 5 forty-ounce beers per day.

## 2016-08-18 NOTE — ED Provider Notes (Signed)
[facility]-EMERGENCY DEPT Provider Note   CSN: [id number] Arrival date & time: 08/18/16  1251     History   Chief Complaint Chief Complaint  Patient presents with  . Alcohol Withdrawal    HPI HENRIQUE PAREKH is a 46 y.o. male.  46 year old male with past medical history including alcohol abuse, polysubstance abuse, alcohol withdrawal seizures, anxiety/depression who presents with alcohol withdrawal. Patient states that his last drink was around 8 AM this morning. He states he woke up with a salty taste in his mouth and was concerned about the possibility of a seizure because he has had alcohol withdrawal seizures in the past. He normally drinks a half gallon of vodka and 5 40-oz beers daily. He also uses cocaine and heroin, last use was 2 days ago. He reports feeling shaky. He had one episode of vomiting this morning but no nausea currently. He reports sweats and cramps. He notes "visual disturbances" in the waiting room but denies any auditory hallucinations. No SI/HI.   The history is provided by the patient.    Past Medical History:  Diagnosis Date  . Anxiety   . Depression   . Elevated liver enzymes   . ETOH abuse   . Hip dislocation   . Seizures [facility])     Patient Active Problem List   Diagnosis Date Noted  . Acute bronchitis 06/03/2016  . Homeless 06/03/2016  . Alcohol withdrawal (HCC) 06/02/2016  . Tobacco abuse 06/02/2016  . Protein-calorie malnutrition, severe (HCC) 07/10/2015  . Transaminitis 07/07/2015  . Hypokalemia 07/07/2015  . Alcohol withdrawal delirium, acute, mixed level of activity (HCC) 07/07/2015  . Severe recurrent major depression without psychotic features (HCC) 04/10/2015  . 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 Alcohol dependence with alcohol-induced mood disorder (HCC) 03/09/2014  . Polysubstance abuse 03/09/2014  . Opioid dependence (HCC) 03/09/2014  . Alcohol use disorder, severe, dependence (HCC) 03/08/2014  . Substance induced mood  disorder (HCC) 03/08/2014  . Alcohol abuse 06/28/2013  . Alcohol withdrawal syndrome without complication (HCC) 06/28/2013  . DTs (delirium tremens) (HCC) 06/28/2013  . Marijuana abuse 04/28/2011  . Alcoholism (HCC) 04/27/2011  . Toxic encephalopathy 04/27/2011  . Psychosis 04/27/2011  . Leukocytosis 04/27/2011  . Hyponatremia 04/27/2011  . Thrombocytopenia (HCC) 04/27/2011  . Elevated AST (SGOT) 04/27/2011  . Elevated bilirubin 04/27/2011    History reviewed. No pertinent surgical history.     Home Medications    Prior to Admission medications   Medication Sig Start Date End Date Taking? Authorizing Provider  azithromycin (ZITHROMAX) 250 MG tablet Take one table. 06/07/16   Regalado, Belkys A, MD  chlordiazePOXIDE (LIBRIUM) 25 MG capsule 50mg  PO TID x 1D, then 25-50mg  PO BID X 1D, then 25-50mg  PO QD X 1D 08/18/16   Vedha Tercero, Ambrose Finland, MD  feeding supplement, ENSURE ENLIVE, (ENSURE ENLIVE) LIQD Take 237 mLs by mouth 3 (three) times daily between meals. 06/06/16   Regalado, Belkys A, MD  folic acid (FOLVITE) 1 MG tablet Take 1 tablet (1 mg total) by mouth daily. 06/06/16   Regalado, Belkys A, MD  Multiple Vitamin (MULTIVITAMIN WITH MINERALS) TABS tablet Take 1 tablet by mouth daily. For low Vitamin Patient not taking: Reported on 01/25/2016 04/13/15   Armandina Stammer I, NP  nicotine (NICODERM CQ - DOSED IN MG/24 HOURS) 21 mg/24hr patch Place 1 patch (21 mg total) onto the skin daily. 06/06/16   Regalado, Belkys A, MD  thiamine 100 MG tablet Take 1 tablet (100 mg total) by mouth daily. 06/06/16  Regalado, Belkys A, MD  traZODone (DESYREL) 100 MG tablet Take 1 tablet (100 mg total) by mouth at bedtime and may repeat dose one time if needed. For sleep Patient not taking: Reported on 01/25/2016 04/13/15   Sanjuana KavaNwoko, Agnes I, NP    Family History Family History  Problem Relation Age of Onset  . Heart attack Father     Social History Social History  Substance Use Topics  . Smoking status:  Current Every Day Smoker    Packs/day: 2.00    Types: Cigarettes  . Smokeless tobacco: Never Used  . Alcohol use 4.8 oz/week    3 Cans of beer, 5 Shots of liquor per week     Comment: daily     Allergies   Patient has no known allergies.   Review of Systems Review of Systems All other systems reviewed and are negative except that which was mentioned in HPI   Physical Exam Updated Vital Signs BP 123/83   Pulse 73   Temp 98.2 F (36.8 C) (Oral)   Resp 19   Ht 6\' 1"  (1.854 m)   Wt 63.5 kg (140 lb)   SpO2 95%   BMI 18.47 kg/m   Physical Exam  Constitutional: He is oriented to person, place, and time. No distress.  Thin, disheveled male awake and alert  HENT:  Head: Normocephalic and atraumatic.  Moist mucous membranes  Eyes: Pupils are equal, round, and reactive to light. Conjunctivae are normal.  Neck: Neck supple.  Cardiovascular: Normal rate, regular rhythm and normal heart sounds.   No murmur heard. Pulmonary/Chest: Effort normal and breath sounds normal.  Abdominal: Soft. Bowel sounds are normal. He exhibits no distension. There is no tenderness.  Musculoskeletal: He exhibits no edema.  Neurological: He is alert and oriented to person, place, and time.  Fluent speech, normal gait  Skin: Skin is warm and dry.  Psychiatric: He has a normal mood and affect. Judgment normal.  Nursing note and vitals reviewed.    ED Treatments / Results  Labs (all labs ordered are listed, but only abnormal results are displayed) Labs Reviewed  COMPREHENSIVE METABOLIC PANEL - Abnormal; Notable for the following:       Result Value   BUN <5 (*)    Total Protein 8.3 (*)    AST 47 (*)    All other components within normal limits  ETHANOL - Abnormal; Notable for the following:    Alcohol, Ethyl (B) 221 (*)    All other components within normal limits  I-STAT CG4 LACTIC ACID, ED - Abnormal; Notable for the following:    Lactic Acid, Venous 2.02 (*)    All other components  within normal limits  CBC    EKG  EKG Interpretation None       Radiology No results found.  Procedures Procedures (including critical care time)  Medications Ordered in ED Medications  sodium chloride 0.9 % bolus 1,000 mL (0 mLs Intravenous Stopped 08/18/16 1845)     Initial Impression / Assessment and Plan / ED Course  I have reviewed the triage vital signs and the nursing notes.  Pertinent labs & imaging results that were available during my care of the patient were reviewed by me and considered in my medical decision making (see chart for details).    Pt w/ h/o alcohol abuse p/w concern for alcohol withdrawal. He was comfortable, sitting up in bed on my exam with reassuring vital signs, no hypertension or tachycardia. He had no obvious tremulousness  on exam, was mentating appropriately, and answered questions appropriately.   His labs showed a lactate 2.02, reassuring CMP and CBC. He has no severe metabolic derangements, no anion gap acidosis, and no evidence of dehydration to suggest alcoholic ketoacidosis. Gave thiamine and he received 1 dose ativan per CIWA protocol.   On reexamination, the patient was resting comfortably and had normal vital signs. Given his reassuring exam, reassuring vital signs, and normal labs, I feel he is appropriate for outpatient management. Have given Librium taper and discussed use. I did provide him with outpatient resources for alcohol detox and emphasized the importance of contacting facilities. Patient voiced understanding of return precautions and was discharged in satisfactory condition. Final Clinical Impressions(s) / ED Diagnoses   Final diagnoses:  Alcohol abuse  Polysubstance abuse    New Prescriptions Discharge Medication List as of 08/18/2016  8:22 PM    START taking these medications   Details  chlordiazePOXIDE (LIBRIUM) 25 MG capsule 50mg  PO TID x 1D, then 25-50mg  PO BID X 1D, then 25-50mg  PO QD X 1D, Print           Navaeh Kehres, Ambrose Finland, MD 08/19/16 0009

## 2016-08-22 ENCOUNTER — Emergency Department (HOSPITAL_COMMUNITY): Admission: EM | Admit: 2016-08-22 | Discharge: 2016-08-22 | Discharge status: Against Medical Advice | Payer: Self-pay

## 2016-08-25 ENCOUNTER — Emergency Department (HOSPITAL_COMMUNITY)
Admission: EM | Admit: 2016-08-25 | Discharge: 2016-08-25 | Disposition: A | Discharge status: Home / Self Care | Payer: Self-pay | Attending: Emergency Medicine | Admitting: Emergency Medicine

## 2016-08-25 DIAGNOSIS — F10239 Alcohol dependence with withdrawal, unspecified: Secondary | ICD-10-CM | POA: Insufficient documentation

## 2016-08-25 DIAGNOSIS — Z5321 Procedure and treatment not carried out due to patient leaving prior to being seen by health care provider: Secondary | ICD-10-CM | POA: Insufficient documentation

## 2016-08-25 NOTE — ED Notes (Signed)
No answer when called for vitals x3 

## 2016-08-25 NOTE — ED Triage Notes (Signed)
Pt states that he was here last week for alcohol detox and has been going to meetings but still needs help. States he was given a RX for librium but was unable to afford it. Also states he is a substance abuser but thinks the alcohol is whats killing him. Alert and oriented. Denies SI/HI.

## 2016-08-26 ENCOUNTER — Emergency Department (HOSPITAL_COMMUNITY)
Admission: EM | Admit: 2016-08-26 | Discharge: 2016-08-27 | Disposition: A | Discharge status: Other Acute Inpatient Hospital | Payer: Self-pay | Attending: Emergency Medicine | Admitting: Emergency Medicine

## 2016-08-26 ENCOUNTER — Encounter (HOSPITAL_COMMUNITY): Payer: Self-pay | Admitting: Emergency Medicine

## 2016-08-26 DIAGNOSIS — R251 Tremor, unspecified: Secondary | ICD-10-CM | POA: Insufficient documentation

## 2016-08-26 DIAGNOSIS — R441 Visual hallucinations: Secondary | ICD-10-CM | POA: Insufficient documentation

## 2016-08-26 DIAGNOSIS — Y908 Blood alcohol level of 240 mg/100 ml or more: Secondary | ICD-10-CM | POA: Insufficient documentation

## 2016-08-26 DIAGNOSIS — F17210 Nicotine dependence, cigarettes, uncomplicated: Secondary | ICD-10-CM | POA: Insufficient documentation

## 2016-08-26 DIAGNOSIS — R634 Abnormal weight loss: Secondary | ICD-10-CM | POA: Insufficient documentation

## 2016-08-26 DIAGNOSIS — Z79899 Other long term (current) drug therapy: Secondary | ICD-10-CM | POA: Insufficient documentation

## 2016-08-26 DIAGNOSIS — F1910 Other psychoactive substance abuse, uncomplicated: Secondary | ICD-10-CM | POA: Insufficient documentation

## 2016-08-26 DIAGNOSIS — F1024 Alcohol dependence with alcohol-induced mood disorder: Secondary | ICD-10-CM | POA: Diagnosis present

## 2016-08-26 DIAGNOSIS — F1010 Alcohol abuse, uncomplicated: Secondary | ICD-10-CM | POA: Insufficient documentation

## 2016-08-26 DIAGNOSIS — Z590 Homelessness: Secondary | ICD-10-CM | POA: Insufficient documentation

## 2016-08-26 DIAGNOSIS — R45851 Suicidal ideations: Secondary | ICD-10-CM | POA: Insufficient documentation

## 2016-08-26 DIAGNOSIS — F1020 Alcohol dependence, uncomplicated: Secondary | ICD-10-CM | POA: Diagnosis present

## 2016-08-26 LAB — CBC
HCT: 45.9 % (ref 39.0–52.0)
HEMOGLOBIN: 16.1 g/dL (ref 13.0–17.0)
MCH: 30.6 pg (ref 26.0–34.0)
MCHC: 35.1 g/dL (ref 30.0–36.0)
MCV: 87.3 fL (ref 78.0–100.0)
Platelets: 222 10*3/uL (ref 150–400)
RBC: 5.26 MIL/uL (ref 4.22–5.81)
RDW: 13 % (ref 11.5–15.5)
WBC: 6.5 10*3/uL (ref 4.0–10.5)

## 2016-08-26 LAB — COMPREHENSIVE METABOLIC PANEL
ALT: 312 U/L — ABNORMAL HIGH (ref 17–63)
ANION GAP: 9 (ref 5–15)
AST: 418 U/L — ABNORMAL HIGH (ref 15–41)
Albumin: 4.6 g/dL (ref 3.5–5.0)
Alkaline Phosphatase: 100 U/L (ref 38–126)
BILIRUBIN TOTAL: 0.8 mg/dL (ref 0.3–1.2)
BUN: 7 mg/dL (ref 6–20)
CO2: 28 mmol/L (ref 22–32)
Calcium: 9.1 mg/dL (ref 8.9–10.3)
Chloride: 103 mmol/L (ref 101–111)
Creatinine, Ser: 0.7 mg/dL (ref 0.61–1.24)
Glucose, Bld: 84 mg/dL (ref 65–99)
POTASSIUM: 4 mmol/L (ref 3.5–5.1)
Sodium: 140 mmol/L (ref 135–145)
TOTAL PROTEIN: 8.1 g/dL (ref 6.5–8.1)

## 2016-08-26 LAB — RAPID URINE DRUG SCREEN, HOSP PERFORMED
Amphetamines: NOT DETECTED
Barbiturates: NOT DETECTED
Benzodiazepines: NOT DETECTED
COCAINE: NOT DETECTED
OPIATES: NOT DETECTED
TETRAHYDROCANNABINOL: NOT DETECTED

## 2016-08-26 LAB — ETHANOL: ALCOHOL ETHYL (B): 323 mg/dL — AB (ref <5)

## 2016-08-26 MED ORDER — LORAZEPAM 1 MG PO TABS
0.0000 mg | ORAL_TABLET | Freq: Four times a day (QID) | ORAL | Status: DC
Start: 2016-08-26 — End: 2016-08-27
  Administered 2016-08-26: 2 mg via ORAL
  Administered 2016-08-27 (×2): 1 mg via ORAL
  Filled 2016-08-26: qty 2
  Filled 2016-08-26 (×2): qty 1

## 2016-08-26 MED ORDER — LORAZEPAM 1 MG PO TABS: 0.0000 mg | ORAL_TABLET | Freq: Two times a day (BID) | ORAL | Status: DC

## 2016-08-26 MED ORDER — LORAZEPAM 2 MG/ML IJ SOLN
0.0000 mg | Freq: Two times a day (BID) | INTRAMUSCULAR | Status: DC
Start: 2016-08-29 — End: 2016-08-27

## 2016-08-26 MED ORDER — LORAZEPAM 2 MG/ML IJ SOLN: 0.0000 mg | Freq: Four times a day (QID) | INTRAMUSCULAR | Status: DC

## 2016-08-26 MED ORDER — THIAMINE HCL 100 MG/ML IJ SOLN: 100.0000 mg | Freq: Every day | INTRAMUSCULAR | Status: DC

## 2016-08-26 MED ORDER — VITAMIN B-1 100 MG PO TABS
100.0000 mg | ORAL_TABLET | Freq: Every day | ORAL | Status: DC
  Administered 2016-08-26 – 2016-08-27 (×2): 100 mg via ORAL
  Filled 2016-08-26 (×2): qty 1

## 2016-08-26 NOTE — ED Notes (Signed)
Bed: [facility] Expected date:  Expected time:  Means of arrival:  Comments: 

## 2016-08-26 NOTE — ED Triage Notes (Signed)
Pt stated to NP that he wants to step in front of a train and end it all.
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
## 2016-08-26 NOTE — ED Notes (Signed)
ED Provider at bedside. 

## 2016-08-26 NOTE — ED Triage Notes (Signed)
Patient reports that he is needing help with ETOH detox, states that he has been here about every night trying to get help, but understands that we are busy.   Patient last drink was 2 hours ago. patient reports to drinking "as much as I can get, couple 5ths and at least 4-40oz per day".  Pt reports that he has been trying to do what Dr Clarene DukeLittle said by going to meetings.
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
## 2016-08-27 ENCOUNTER — Inpatient Hospital Stay (HOSPITAL_COMMUNITY)
Admission: AD | Admit: 2016-08-27 | Discharge: 2016-09-01 | DRG: 897 | Disposition: A | Discharge status: Home / Self Care | Payer: Federal, State, Local not specified - Other | Source: Intra-hospital | Attending: Psychiatry | Admitting: Psychiatry

## 2016-08-27 ENCOUNTER — Encounter (HOSPITAL_COMMUNITY): Payer: Self-pay | Admitting: Emergency Medicine

## 2016-08-27 ENCOUNTER — Encounter (HOSPITAL_COMMUNITY): Payer: Self-pay | Admitting: *Deleted

## 2016-08-27 DIAGNOSIS — R443 Hallucinations, unspecified: Secondary | ICD-10-CM | POA: Diagnosis present

## 2016-08-27 DIAGNOSIS — F10230 Alcohol dependence with withdrawal, uncomplicated: Secondary | ICD-10-CM | POA: Diagnosis present

## 2016-08-27 DIAGNOSIS — R45851 Suicidal ideations: Secondary | ICD-10-CM

## 2016-08-27 DIAGNOSIS — F17210 Nicotine dependence, cigarettes, uncomplicated: Secondary | ICD-10-CM | POA: Diagnosis present

## 2016-08-27 DIAGNOSIS — F1024 Alcohol dependence with alcohol-induced mood disorder: Secondary | ICD-10-CM

## 2016-08-27 DIAGNOSIS — F419 Anxiety disorder, unspecified: Secondary | ICD-10-CM | POA: Diagnosis present

## 2016-08-27 DIAGNOSIS — I10 Essential (primary) hypertension: Secondary | ICD-10-CM | POA: Diagnosis present

## 2016-08-27 DIAGNOSIS — Z560 Unemployment, unspecified: Secondary | ICD-10-CM | POA: Diagnosis not present

## 2016-08-27 DIAGNOSIS — F10229 Alcohol dependence with intoxication, unspecified: Principal | ICD-10-CM | POA: Diagnosis present

## 2016-08-27 DIAGNOSIS — Z590 Homelessness: Secondary | ICD-10-CM | POA: Diagnosis not present

## 2016-08-27 DIAGNOSIS — F332 Major depressive disorder, recurrent severe without psychotic features: Secondary | ICD-10-CM | POA: Diagnosis present

## 2016-08-27 DIAGNOSIS — Y908 Blood alcohol level of 240 mg/100 ml or more: Secondary | ICD-10-CM | POA: Diagnosis present

## 2016-08-27 DIAGNOSIS — Z811 Family history of alcohol abuse and dependence: Secondary | ICD-10-CM | POA: Diagnosis not present

## 2016-08-27 DIAGNOSIS — Z79899 Other long term (current) drug therapy: Secondary | ICD-10-CM | POA: Diagnosis not present

## 2016-08-27 DIAGNOSIS — Z23 Encounter for immunization: Secondary | ICD-10-CM

## 2016-08-27 DIAGNOSIS — F10930 Alcohol use, unspecified with withdrawal, uncomplicated: Secondary | ICD-10-CM | POA: Diagnosis present

## 2016-08-27 DIAGNOSIS — F10239 Alcohol dependence with withdrawal, unspecified: Secondary | ICD-10-CM | POA: Diagnosis not present

## 2016-08-27 DIAGNOSIS — F1010 Alcohol abuse, uncomplicated: Secondary | ICD-10-CM | POA: Diagnosis present

## 2016-08-27 MED ORDER — THIAMINE HCL 100 MG/ML IJ SOLN: 100.0000 mg | Freq: Once | INTRAMUSCULAR | Status: AC

## 2016-08-27 MED ORDER — HALOPERIDOL 5 MG PO TABS
5.0000 mg | ORAL_TABLET | Freq: Four times a day (QID) | ORAL | Status: DC | PRN
  Administered 2016-08-29 – 2016-08-30 (×2): 5 mg via ORAL
  Filled 2016-08-27 (×2): qty 1

## 2016-08-27 MED ORDER — VITAMIN B-1 100 MG PO TABS
100.0000 mg | ORAL_TABLET | Freq: Every day | ORAL | Status: DC
  Administered 2016-08-28 – 2016-09-01 (×5): 100 mg via ORAL
  Filled 2016-08-27 (×7): qty 1

## 2016-08-27 MED ORDER — NICOTINE 21 MG/24HR TD PT24
21.0000 mg | MEDICATED_PATCH | Freq: Every day | TRANSDERMAL | Status: DC
  Administered 2016-08-27 – 2016-09-01 (×6): 21 mg via TRANSDERMAL
  Filled 2016-08-27 (×9): qty 1

## 2016-08-27 MED ORDER — LORAZEPAM 1 MG PO TABS
1.0000 mg | ORAL_TABLET | Freq: Four times a day (QID) | ORAL | Status: AC
  Administered 2016-08-27 – 2016-08-28 (×4): 1 mg via ORAL
  Filled 2016-08-27 (×4): qty 1

## 2016-08-27 MED ORDER — PNEUMOCOCCAL VAC POLYVALENT 25 MCG/0.5ML IJ INJ
0.5000 mL | INJECTION | INTRAMUSCULAR | Status: AC
  Administered 2016-08-28: 0.5 mL via INTRAMUSCULAR

## 2016-08-27 MED ORDER — ACETAMINOPHEN 325 MG PO TABS
650.0000 mg | ORAL_TABLET | Freq: Four times a day (QID) | ORAL | Status: DC | PRN
  Administered 2016-08-27: 650 mg via ORAL
  Filled 2016-08-27: qty 2

## 2016-08-27 MED ORDER — LORAZEPAM 1 MG PO TABS
1.0000 mg | ORAL_TABLET | Freq: Every day | ORAL | Status: AC
  Administered 2016-08-31: 1 mg via ORAL
  Filled 2016-08-27 (×2): qty 1

## 2016-08-27 MED ORDER — ADULT MULTIVITAMIN W/MINERALS CH
1.0000 | ORAL_TABLET | Freq: Every day | ORAL | Status: DC
  Administered 2016-08-27 – 2016-09-01 (×6): 1 via ORAL
  Filled 2016-08-27 (×9): qty 1

## 2016-08-27 MED ORDER — MAGNESIUM HYDROXIDE 400 MG/5ML PO SUSP: 30.0000 mL | Freq: Every day | ORAL | Status: DC | PRN

## 2016-08-27 MED ORDER — TRAZODONE HCL 50 MG PO TABS
50.0000 mg | ORAL_TABLET | Freq: Every evening | ORAL | Status: DC | PRN
  Administered 2016-08-27 – 2016-08-31 (×5): 50 mg via ORAL
  Filled 2016-08-27 (×2): qty 1
  Filled 2016-08-27: qty 7
  Filled 2016-08-27 (×3): qty 1

## 2016-08-27 MED ORDER — HYDROXYZINE HCL 25 MG PO TABS: 25.0000 mg | ORAL_TABLET | Freq: Four times a day (QID) | ORAL | Status: DC | PRN

## 2016-08-27 MED ORDER — HYDROXYZINE HCL 25 MG PO TABS
25.0000 mg | ORAL_TABLET | Freq: Four times a day (QID) | ORAL | Status: DC | PRN
  Administered 2016-08-29 – 2016-09-01 (×5): 25 mg via ORAL
  Filled 2016-08-27 (×2): qty 1
  Filled 2016-08-27: qty 10
  Filled 2016-08-27 (×3): qty 1

## 2016-08-27 MED ORDER — ALUM & MAG HYDROXIDE-SIMETH 200-200-20 MG/5ML PO SUSP: 30.0000 mL | ORAL | Status: DC | PRN

## 2016-08-27 MED ORDER — LORAZEPAM 1 MG PO TABS
1.0000 mg | ORAL_TABLET | Freq: Two times a day (BID) | ORAL | Status: AC
  Administered 2016-08-29 – 2016-08-30 (×2): 1 mg via ORAL
  Filled 2016-08-27: qty 1

## 2016-08-27 MED ORDER — LORAZEPAM 1 MG PO TABS
1.0000 mg | ORAL_TABLET | Freq: Four times a day (QID) | ORAL | Status: AC | PRN
  Administered 2016-08-28 – 2016-08-29 (×2): 1 mg via ORAL
  Filled 2016-08-27 (×2): qty 1

## 2016-08-27 MED ORDER — ONDANSETRON 4 MG PO TBDP
4.0000 mg | ORAL_TABLET | Freq: Four times a day (QID) | ORAL | Status: AC | PRN
  Administered 2016-08-27: 4 mg via ORAL
  Filled 2016-08-27: qty 1

## 2016-08-27 MED ORDER — LORAZEPAM 1 MG PO TABS
1.0000 mg | ORAL_TABLET | Freq: Three times a day (TID) | ORAL | Status: AC
  Administered 2016-08-28 – 2016-08-29 (×3): 1 mg via ORAL
  Filled 2016-08-27 (×3): qty 1

## 2016-08-27 MED ORDER — BENZTROPINE MESYLATE 1 MG PO TABS
1.0000 mg | ORAL_TABLET | Freq: Four times a day (QID) | ORAL | Status: DC | PRN
  Administered 2016-08-28: 1 mg via ORAL
  Filled 2016-08-27: qty 1

## 2016-08-27 MED ORDER — ENSURE ENLIVE PO LIQD
237.0000 mL | Freq: Three times a day (TID) | ORAL | Status: DC
  Administered 2016-08-27 – 2016-09-01 (×6): 237 mL via ORAL

## 2016-08-27 MED ORDER — LOPERAMIDE HCL 2 MG PO CAPS
2.0000 mg | ORAL_CAPSULE | ORAL | Status: AC | PRN
  Filled 2016-08-27: qty 1

## 2016-08-27 NOTE — BH Assessment (Signed)
[facility] Assessment Progress Note  Per Thedore MinsMojeed Akintayo, MD, this pt requires psychiatric hospitalization at this time.  Percell BostonAkeysha Rutledge, RN, [facility].  Pt has signed Voluntary Admission and Consent for Treatment, as well as Consent to Release Information to no one, and signed forms have [facility].  Pt's nurse has been notified, and agrees to send original paperwork along with pt via [place], and to call report to [phone].  Doylene Canninghomas Terez Freimark, MA Triage Specialist [phone]

## 2016-08-27 NOTE — Progress Notes (Signed)
Patient did not attend NA group meeting.  
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
## 2016-08-27 NOTE — Progress Notes (Signed)
Admit note: Patient vol [facility] Patient presents requesting alcohol detox and expresses SI to jump from a bridge or in front of a train. States he has attempted to attend AA meetings but cannot stop drinking. Patient currently homeless with no supports identified. States he does not speak to his family and has lost several friends due to substance abuse complications. BAL is "323" and UDS neg. Patient reports 30 lbs weight loss and decreased appetite x 6 months. CIWA is a "10" on admit - tremors, restlessness, anxiety, agitation, sweats and flushed face. VSS. Denies pain. Reports hx of withdrawal related seizures and DTs. Denies any other PMH, LFTs quite elevated.  Skin assessed, belongings searched and secured. Level III obs initiated. Ativan protocol in place. Emotional support offered and reassurance provided. Fluids encouraged. Fall prevention education provided.  Patient verbalizes understanding. Currently resting in bed. Denies SI/HI/AVH at present. Will continue to monitor closely and make verbal contact frequently.

## 2016-08-27 NOTE — ED Notes (Signed)
Pt behavior cooperative, pleasant on approach, sad affect. Pt endorsing SI. Denies AVH. Encouragement and support provided. CIWA protocol in place. Special checks q 15 mins in place for safety, Video monitoring in place. Will continue to monitor.

## 2016-08-27 NOTE — ED Notes (Signed)
[facility]. Personal property given to [facility] transport for transfer. Pt signed e-signature. Ambulatory off unit.

## 2016-08-28 DIAGNOSIS — F17210 Nicotine dependence, cigarettes, uncomplicated: Secondary | ICD-10-CM

## 2016-08-28 DIAGNOSIS — F1994 Other psychoactive substance use, unspecified with psychoactive substance-induced mood disorder: Secondary | ICD-10-CM

## 2016-08-28 DIAGNOSIS — F10230 Alcohol dependence with withdrawal, uncomplicated: Secondary | ICD-10-CM

## 2016-08-28 DIAGNOSIS — Z560 Unemployment, unspecified: Secondary | ICD-10-CM

## 2016-08-28 DIAGNOSIS — Z590 Homelessness: Secondary | ICD-10-CM

## 2016-08-28 DIAGNOSIS — I10 Essential (primary) hypertension: Secondary | ICD-10-CM

## 2016-08-28 MED ORDER — NALTREXONE HCL 50 MG PO TABS
50.0000 mg | ORAL_TABLET | Freq: Every day | ORAL | Status: DC
  Administered 2016-08-28 – 2016-09-01 (×5): 50 mg via ORAL
  Filled 2016-08-28 (×3): qty 1
  Filled 2016-08-28: qty 7
  Filled 2016-08-28 (×3): qty 1

## 2016-08-28 MED ORDER — LORAZEPAM 1 MG PO TABS
1.0000 mg | ORAL_TABLET | Freq: Once | ORAL | Status: AC
  Administered 2016-08-28: 1 mg via ORAL
  Filled 2016-08-28: qty 1

## 2016-08-28 NOTE — Progress Notes (Signed)
Initial Nutrition Assessment  DOCUMENTATION CODES:   Underweight  INTERVENTION:   Ensure Enlive po TID, each supplement provides 350 kcal and 20 grams of protein  NUTRITION DIAGNOSIS:   Inadequate oral intake related to social / environmental circumstances as evidenced by other (see comment) (Pt noted with homelessness and alcohol abuse).  GOAL:   Patient will meet greater than or equal to 90% of their needs  MONITOR:   PO intake, Supplement acceptance  REASON FOR ASSESSMENT:   Malnutrition Screening Tool   ASSESSMENT:   Pt with PMH of alcohol abuse, HTN, and associated mood disorder. Presents this admission seeking help for alcohol abuse.   Pt noted to abuse alcohol since his early teens. Consumes alcohol daily, typically drinking until he passes out. Noted to have seizures without alcohol. Suspect pt has had decrease in appetite. Concerned pt has hard time getting food with homelessness. Pt noted to have previous diagnosis of severe malnutrition. Given current wt and IBW percentage, RD suspects pt continues to be malnourished. Records indicate wt has fluctuated from 132-140 lb though hard to distinguish between stated and actual wts obtained. Provide supplementation for increased needs.   Unable to complete Nutrition-Focused physical exam at this time.   Medications reviewed and include: MVI, thiamine Labs reviewed: AST 418 (H) ALT 312 (H)   Diet Order:  Diet regular Room service appropriate? No; Fluid consistency: Thin  Skin:  Reviewed, no issues  Last BM:  08/25/16  Height:   Ht Readings from Last 1 Encounters:  08/27/16 5\' 11"  (1.803 m)    Weight:   Wt Readings from Last 1 Encounters:  08/27/16 130 lb (59 kg)    Ideal Body Weight:  78.2 kg  BMI:  Body mass index is 18.13 kg/m.  Estimated Nutritional Needs:   Kcal:  1800-2000 (31-34 kcal/kg)  Protein:  100-110 grams (1.7-1.9 g/kg)  Fluid:  >1.8 L/day  EDUCATION NEEDS:   No education needs  identified at this time  Vanessa Kickarly Rollyn Scialdone RD, LDN Clinical Nutrition Pager # - [phone]

## 2016-08-28 NOTE — Plan of Care (Signed)
Problem: Medication: Goal: Compliance with prescribed medication regimen will improve Outcome: Progressing Pt has been medication compliant. Denies adverse drug reactions when assessed.   Problem: Physical Regulation: Goal: Complications related to the disease process, condition or treatment will be avoided or minimized Outcome: Progressing Pt reports improvement in withdrawal symptoms when assessed. "I'm not sweaty or shaking no more, my anxiety level have come down some".

## 2016-08-28 NOTE — Plan of Care (Signed)
Problem: Safety: Goal: Periods of time without injury will increase Outcome: Progressing Patient is on q15 minute safety checks and low fall risk precautions. Patient contracts for safety on the unit, vital signs stable.

## 2016-08-28 NOTE — Progress Notes (Signed)
D: Jake [name] & O X3 when assessed. Denies SI, HI, AVH and pain. Rates his depression, hopelessness and anxiety all 9/10. Presents with depressed affect and mood. Tremulous at medication window this shift with c/o muscle cramps. Jake received [name] one time order of Ativan 1 mg PO in addition to scheduled Ativan 1 mg and PRN Ativan 1 mg related to elevated CIWA.  [name]: Scheduled and PRN medications administered as per MD's orders and effects monitored. Emotional support and availability provided to Jake throughout this shift. Fluids encouraged. Q 15 minutes safety checks maintained without outburst or self harm gestures to note at this time.  R: Jake receptive to care. Tolerated fluids and meals well. Compliant with medications as ordered. Denies adverse drug reactions. POC maintained for safety and mood stability.
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
## 2016-08-28 NOTE — Progress Notes (Signed)
Nursing Progress Note [id number]  D) Patient presents flat and depressed but is pleasant and cooperative. Patient is isolative to his room and states, "I can't be around people right now". Patient reports he is having nausea, skin tingling, restlessness and stomach cramps. CIWA 10. Patient medicated per protocol. Patient denies SI/HI/AVH. Patient contracts for safety on the unit.   A) Emotional support given. 1:1 interaction and active listening provided. Patient medicated as prescribed. Medications and plan of care reviewed with patient. Patient verbalized understanding without further questions.  Snacks and fluids provided. Opportunities for questions or concerns presented to patient. Patient encouraged to continue to work on treatment goals. Labs, vital signs and patient behavior monitored throughout shift. Patient safety maintained with q15 min safety checks. Low fall risk precautions in place and reviewed with patient; patient verbalized understanding.  R) Patient receptive to interaction with nurse. Patient remains safe on the unit at this time. Patient denies any adverse medication reactions at this time. Patient is resting in bed without complaints. Will continue to monitor.
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
## 2016-08-28 NOTE — [facility] Suicide Risk Assessment (Signed)
[facility] INPATIENT:  Family/Significant Other Suicide Prevention Education  Suicide Prevention Education:  Patient Refusal for Family/Significant Other Suicide Prevention Education: The patient Jake [name] has refused to provide written consent for family/significant other to be provided Family/Significant Other Suicide Prevention Education during admission and/or prior to discharge.  Physician notified.  Verdene LennertLauren C Kalyb Pemble 08/28/2016, 3:30 PM

## 2016-08-28 NOTE — [facility] Group Notes (Signed)
Pt. Did not attend group. 

## 2016-08-29 NOTE — Progress Notes (Signed)
Psychoeducational Group Note  Date:  08/29/2016 Time:  2313  Group Topic/Focus:  Wrap-Up Group:   The focus of this group is to help patients review their daily goal of treatment and discuss progress on daily workbooks.  Participation Level: Did Not Attend  Participation Quality:  Not Applicable  Affect:  Not Applicable  Cognitive:  Not Applicable  Insight:  Not Applicable  Engagement in Group: Not Applicable  Additional Comments: The patient did not attend the evening A.A.meeting and remained in his bed.   Jake [name] 08/29/2016, 11:13 PM

## 2016-08-29 NOTE — Progress Notes (Signed)
D Pt  is seen OOb UAL on the unit today, tolerating well. HE remains flat, quiet and depressed. He takes his scheduled meds as planned and he completes his daily assessment. A On this , he writes he denies SI today and he rates his depression, hopelessness and anxeity " 09/21/08", respectively. R Safety is in place.

## 2016-08-29 NOTE — Progress Notes (Signed)
[facility] MD Progress Note  08/29/2016 2:20 PM Jake [name]  MRN:  [id number] Subjective:   46 yo Caucasian male, single, never married, no kids, homeless and unemployed. Self presented to the ER seeking help for alcohol use. Background history of AUD and associated mood disorder. Drinks daily and had his last drink two hour prior to presentation. Had presented a couple of days earlier seeking help. This time expressed thoughts of jumping in front of a train or off the bridge. BAL at presentation was 323 mg/dl. AST 418, ALT 312. UDS and other laboratory parameters were essentially normal.  Chart reviewed today. Patient discussed at team today.  Staff reports that he is coming off alcohol smoothly. CIWA is trending down. No complications so far. He stays in his room most of the time. He has not been interacting with peers. Limited group engagement.   Seen today. Says he has been ruminating a lot on the negative things that has happened over the years. Feels down but not suicidal. Reports feeling sweaty. Sometimes sees things in a distorted way. No feeling of impending doom. No paranoia. No hallucinations lately. No suicidal thoughts. Says he has been drinking a lot of fluids to stay hydrated.  Encouraged that things would get better with time.   Principal Problem: Alcohol withdrawal syndrome without complication (HCC) Diagnosis:   Patient Active Problem List   Diagnosis Date Noted  . MDD (major depressive disorder), recurrent severe, without psychosis (HCC) [F33.2] 08/27/2016  . Acute bronchitis [J20.9] 06/03/2016  . Homeless [Z59.0] 06/03/2016  . Alcohol withdrawal (HCC) [F10.239] 06/02/2016  . Tobacco abuse [Z72.0] 06/02/2016  . Protein-calorie malnutrition, severe (HCC) [E43] 07/10/2015  . Transaminitis [R74.0] 07/07/2015  . Hypokalemia [E87.6] 07/07/2015  . Alcohol withdrawal delirium, acute, mixed level of activity (HCC) [F10.231] 07/07/2015  . Severe recurrent major depression without  psychotic features (HCC) [F33.2] 04/10/2015  . Alcohol-induced mood disorder (HCC) [F10.94] 04/06/2015  . 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 Polysubstance abuse [F19.10] 03/09/2014  . Opioid dependence (HCC) [F11.20] 03/09/2014  . Alcohol use disorder, severe, dependence (HCC) [F10.20] 03/08/2014  . Substance induced mood disorder (HCC) [F19.94] 03/08/2014  . Alcohol abuse [F10.10] 06/28/2013  . Alcohol withdrawal syndrome without complication (HCC) [F10.230] 06/28/2013  . DTs (delirium tremens) (HCC) [F10.231] 06/28/2013  . Marijuana abuse [F12.10] 04/28/2011  . Alcoholism (HCC) [F10.20] 04/27/2011  . Toxic encephalopathy [G92] 04/27/2011  . Psychosis [F29] 04/27/2011  . Leukocytosis [D72.829] 04/27/2011  . Hyponatremia [E87.1] 04/27/2011  . Thrombocytopenia (HCC) [D69.6] 04/27/2011  . Elevated AST (SGOT) [R74.0] 04/27/2011  . Elevated bilirubin [R17] 04/27/2011   Total Time spent with patient: 20 minutes  Past Psychiatric History: As in H&P  Past Medical History:  Past Medical History:  Diagnosis Date  . Anxiety   . Depression   . Elevated liver enzymes   . ETOH abuse   . Hip dislocation   . Seizures (HCC)    ETOH withdrawl   History reviewed. No pertinent surgical history. Family History:  Family History  Problem Relation Age of Onset  . Heart attack Father    Family Psychiatric  History: As in H&P Social History:  History  Alcohol Use  . 4.8 oz/week  . 3 Cans of beer, 5 Shots of liquor per week    Comment: daily     History  Drug Use No    Comment: denies any drug use since he was seen for a heroin overdose on 07/07/15  Social History   Social History  . Marital status: Single    Spouse name: N/A  . Number of children: N/A  . Years of education: N/A   Social History Main Topics  . Smoking status: Current Every Day Smoker    Packs/day: 2.00    Types: Cigarettes  . Smokeless tobacco: Never Used  .  Alcohol use 4.8 oz/week    3 Cans of beer, 5 Shots of liquor per week     Comment: daily  . Drug use: No     Comment: denies any drug use since he was seen for a heroin overdose on 07/07/15   . Sexual activity: Yes   Other Topics Concern  . None   Social History Narrative  . None   Additional Social History:      Sleep: Good  Appetite:  Good  Current Medications: Current Facility-Administered Medications  Medication Dose Route Frequency Provider Last Rate Last Dose  . acetaminophen (TYLENOL) tablet 650 mg  650 mg Oral Q6H PRN Laveda Abbe, NP   650 mg at 08/27/16 2211  . alum & mag hydroxide-simeth (MAALOX/MYLANTA) 200-200-20 MG/5ML suspension 30 mL  30 mL Oral Q4H PRN Laveda Abbe, NP      . haloperidol (HALDOL) tablet 5 mg  5 mg Oral Q6H PRN Laveda Abbe, NP       And  . benztropine (COGENTIN) tablet 1 mg  1 mg Oral Q6H PRN Laveda Abbe, NP   1 mg at 08/28/16 1729  . feeding supplement (ENSURE ENLIVE) (ENSURE ENLIVE) liquid 237 mL  237 mL Oral TID BM Cobos, Rockey Situ, MD   237 mL at 08/27/16 2054  . hydrOXYzine (ATARAX/VISTARIL) tablet 25 mg  25 mg Oral Q6H PRN Laveda Abbe, NP      . loperamide (IMODIUM) capsule 2-4 mg  2-4 mg Oral PRN Laveda Abbe, NP      . LORazepam (ATIVAN) tablet 1 mg  1 mg Oral Q6H PRN Laveda Abbe, NP   1 mg at 08/28/16 1729  . LORazepam (ATIVAN) tablet 1 mg  1 mg Oral BID Laveda Abbe, NP       Followed by  . [START ON 08/31/2016] LORazepam (ATIVAN) tablet 1 mg  1 mg Oral Daily Laveda Abbe, NP      . magnesium hydroxide (MILK OF MAGNESIA) suspension 30 mL  30 mL Oral Daily PRN Laveda Abbe, NP      . multivitamin with minerals tablet 1 tablet  1 tablet Oral Daily Laveda Abbe, NP   1 tablet at 08/29/16 [phone]  . naltrexone (DEPADE) tablet 50 mg  50 mg Oral Daily Georgiann Cocker, MD   50 mg at 08/29/16 0811  . nicotine (NICODERM CQ - dosed in mg/24  hours) patch 21 mg  21 mg Transdermal Daily Cobos, Rockey Situ, MD   21 mg at 08/29/16 0811  . ondansetron (ZOFRAN-ODT) disintegrating tablet 4 mg  4 mg Oral Q6H PRN Laveda Abbe, NP   4 mg at 08/27/16 2055  . thiamine (VITAMIN B-1) tablet 100 mg  100 mg Oral Daily Laveda Abbe, NP   100 mg at 08/29/16 5176  . traZODone (DESYREL) tablet 50 mg  50 mg Oral QHS PRN Laveda Abbe, NP   50 mg at 08/28/16 2125    Lab Results: No results found for this or any previous visit (from the past 48 hour(s)).  Blood Alcohol level:  Lab  Results  Component Value Date   ETH 323 (HH) 08/26/2016   ETH 221 (H) 08/18/2016    Metabolic Disorder Labs: No results found for: HGBA1C, MPG No results found for: PROLACTIN No results found for: CHOL, TRIG, HDL, CHOLHDL, VLDL, LDLCALC  Physical Findings: AIMS: Facial and Oral Movements Muscles of Facial Expression: None, normal Lips and Perioral Area: None, normal Jaw: None, normal Tongue: None, normal,Extremity Movements Upper (arms, wrists, hands, fingers): None, normal Lower (legs, knees, ankles, toes): None, normal, Trunk Movements Neck, shoulders, hips: None, normal, Overall Severity Severity of abnormal movements (highest score from questions above): None, normal Incapacitation due to abnormal movements: None, normal Patient's awareness of abnormal movements (rate only patient's report): No Awareness, Dental Status Current problems with teeth and/or dentures?: No Does patient usually wear dentures?: No  CIWA:  CIWA-Ar Total: 5 COWS:     Musculoskeletal: Strength & Muscle Tone: within normal limits Gait & Station: broad based Patient leans: N/A  Psychiatric Specialty Exam: Physical Exam  Constitutional: He is oriented to person, place, and time. No distress.  HENT:  Head: Normocephalic and atraumatic.  Respiratory: Effort normal.  Neurological: He is alert and oriented to person, place, and time.  Skin: He is not  diaphoretic.  Psychiatric:  As above    ROS  Blood pressure 109/69, pulse 66, temperature 98.2 F (36.8 C), temperature source Oral, resp. rate 16, height 5\' 11"  (1.803 m), weight 59 kg (130 lb).Body mass index is 18.13 kg/m.  General Appearance: In bed, not shaky, not breaking out in sweats. Not confused or internally stimulated.   Eye Contact:  Good  Speech:  Clear and Coherent and Normal Rate  Volume:  Normal  Mood:  Depressed  Affect:  Blunted and mood congruent.   Thought Process:  Linear  Orientation:  Full (Time, Place, and Person)  Thought Content:  Rumination. No hallucination. No thoughts of violence.   Suicidal Thoughts:  No  Homicidal Thoughts:  No  Memory:  Immediate;   Fair Recent;   Fair Remote;   Good  Judgement:  Fair  Insight:  Good  Psychomotor Activity:  Decreased  Concentration:  Concentration: Fair and Attention Span: Fair  Recall:  [employer] of Knowledge:  Fair  Language:  Good  Akathisia:  Negative  Handed:    AIMS (if indicated):     Assets:  Desire for Improvement Resilience  ADL's:  Impaired  Cognition:  WNL  Sleep:  Number of Hours: 6.25     Assessment and Plan   Patient is smoothly coming off alcohol. Depression and dysphoria are due to alcohol withdrawals. He remains at risk of withdrawal seizures. He needs inpatient care while we are tapering benzodiazepines. Hopeful discharge early next week.   Psychiatric: AUD SIMD  Medical: HTN  Psychosocial:  Homelessness Unemployment No support  PLAN: 1. Continue medications at current dose 2. Continue to monitor mood, behavior and interactions  Georgiann Cocker, MD 08/29/2016, 2:20 PM

## 2016-08-29 NOTE — Plan of Care (Signed)
Problem: Physical Regulation: Goal: Ability to maintain clinical measurements within normal limits will improve Outcome: Progressing Patient's vital signs this evening are WNL. CIWA score trending down with use of medications.

## 2016-08-29 NOTE — Tx Team (Signed)
Interdisciplinary Treatment and Diagnostic Plan Update 08/29/2016 Time of Session: 9:30am  Jake [name]  MRN: [id number]  Principal Diagnosis: Alcohol withdrawal syndrome without complication [facility], [place])  Secondary Diagnoses: Principal Problem:   Alcohol withdrawal syndrome without complication ([place]) Active Problems:   MDD (major depressive disorder), recurrent severe, without psychosis ([place])   Current Medications:  Current Facility-Administered Medications  Medication Dose Route Frequency Provider Last Rate Last Dose  . acetaminophen (TYLENOL) tablet 650 mg  650 mg Oral Q6H PRN Ethelene Hal, NP   650 mg at 08/27/16 2211  . alum & mag hydroxide-simeth (MAALOX/MYLANTA) 200-200-20 MG/5ML suspension 30 mL  30 mL Oral Q4H PRN Ethelene Hal, NP      . haloperidol (HALDOL) tablet 5 mg  5 mg Oral Q6H PRN Ethelene Hal, NP       And  . benztropine (COGENTIN) tablet 1 mg  1 mg Oral Q6H PRN Ethelene Hal, NP   1 mg at 08/28/16 1729  . feeding supplement (ENSURE ENLIVE) (ENSURE ENLIVE) liquid 237 mL  237 mL Oral TID BM Cobos, Myer Peer, MD   237 mL at 08/27/16 2054  . hydrOXYzine (ATARAX/VISTARIL) tablet 25 mg  25 mg Oral Q6H PRN Ethelene Hal, NP      . loperamide (IMODIUM) capsule 2-4 mg  2-4 mg Oral PRN Ethelene Hal, NP      . LORazepam (ATIVAN) tablet 1 mg  1 mg Oral Q6H PRN Ethelene Hal, NP   1 mg at 08/28/16 1729  . LORazepam (ATIVAN) tablet 1 mg  1 mg Oral TID Ethelene Hal, NP   1 mg at 08/29/16 5621   Followed by  . LORazepam (ATIVAN) tablet 1 mg  1 mg Oral BID Ethelene Hal, NP       Followed by  . [START ON 08/31/2016] LORazepam (ATIVAN) tablet 1 mg  1 mg Oral Daily Ethelene Hal, NP      . magnesium hydroxide (MILK OF MAGNESIA) suspension 30 mL  30 mL Oral Daily PRN Ethelene Hal, NP      . multivitamin with minerals tablet 1 tablet  1 tablet Oral Daily Ethelene Hal, NP   1 tablet at  08/29/16 [phone]  . naltrexone (DEPADE) tablet 50 mg  50 mg Oral Daily Artist Beach, MD   50 mg at 08/29/16 0811  . nicotine (NICODERM CQ - dosed in mg/24 hours) patch 21 mg  21 mg Transdermal Daily Cobos, Myer Peer, MD   21 mg at 08/29/16 0811  . ondansetron (ZOFRAN-ODT) disintegrating tablet 4 mg  4 mg Oral Q6H PRN Ethelene Hal, NP   4 mg at 08/27/16 2055  . thiamine (VITAMIN B-1) tablet 100 mg  100 mg Oral Daily Ethelene Hal, NP   100 mg at 08/29/16 5784  . traZODone (DESYREL) tablet 50 mg  50 mg Oral QHS PRN Ethelene Hal, NP   50 mg at 08/28/16 2125    PTA Medications: Prescriptions Prior to Admission  Medication Sig Dispense Refill Last Dose  . azithromycin (ZITHROMAX) 250 MG tablet Take one table. (Patient not taking: Reported on 08/26/2016) 1 each 0 Not Taking at Unknown time  . chlordiazePOXIDE (LIBRIUM) 25 MG capsule '50mg'$  PO TID x 1D, then 25-'50mg'$  PO BID X 1D, then 25-'50mg'$  PO QD X 1D (Patient not taking: Reported on 08/26/2016) 10 capsule 0 Not Taking at Unknown time  . feeding supplement, ENSURE ENLIVE, (ENSURE ENLIVE) LIQD Take 237 mLs  by mouth 3 (three) times daily between meals. (Patient not taking: Reported on 08/26/2016) 237 mL 12 Not Taking at Unknown time  . folic acid (FOLVITE) 1 MG tablet Take 1 tablet (1 mg total) by mouth daily. (Patient not taking: Reported on 08/26/2016) 30 tablet 0 Not Taking at Unknown time  . Multiple Vitamin (MULTIVITAMIN WITH MINERALS) TABS tablet Take 1 tablet by mouth daily. For low Vitamin (Patient not taking: Reported on 01/25/2016)   Not Taking at Unknown time  . nicotine (NICODERM CQ - DOSED IN MG/24 HOURS) 21 mg/24hr patch Place 1 patch (21 mg total) onto the skin daily. (Patient not taking: Reported on 08/26/2016) 28 patch 0 Not Taking at Unknown time  . thiamine 100 MG tablet Take 1 tablet (100 mg total) by mouth daily. (Patient not taking: Reported on 08/26/2016) 30 tablet 0 Not Taking at Unknown time  . traZODone  (DESYREL) 100 MG tablet Take 1 tablet (100 mg total) by mouth at bedtime and may repeat dose one time if needed. For sleep (Patient not taking: Reported on 01/25/2016) 60 tablet 0 Not Taking at Unknown time    Treatment Modalities: Medication Management, Group therapy, Case management,  1 to 1 session with clinician, Psychoeducation, Recreational therapy.  Patient Stressors: Financial difficulties Loss of support - no contact with family, has lost several friends to sub abuse Medication change or noncompliance Substance abuse Patient Strengths: Average or above average intelligence Curator fund of knowledge Work Artist for Primary Diagnosis: Alcohol withdrawal syndrome without complication ([place]) Long Term Goal(s): Improvement in symptoms so as ready for discharge Short Term Goals: Ability to identify changes in lifestyle to reduce recurrence of condition will improve Ability to verbalize feelings will improve Ability to disclose and discuss suicidal ideas Ability to demonstrate self-control will improve Ability to identify and develop effective coping behaviors will improve Ability to maintain clinical measurements within normal limits will improve Compliance with prescribed medications will improve Ability to identify triggers associated with substance abuse/mental health issues will improve Ability to identify changes in lifestyle to reduce recurrence of condition will improve Ability to verbalize feelings will improve Ability to disclose and discuss suicidal ideas Ability to demonstrate self-control will improve Ability to identify and develop effective coping behaviors will improve Ability to maintain clinical measurements within normal limits will improve Compliance with prescribed medications will improve Ability to identify triggers associated with substance abuse/mental health issues will improve  Medication Management: Evaluate  patient's response, side effects, and tolerance of medication regimen.  Therapeutic Interventions: 1 to 1 sessions, Unit Group sessions and Medication administration.  Evaluation of Outcomes: Not Met  Physician Treatment Plan for Secondary Diagnosis: Principal Problem:   Alcohol withdrawal syndrome without complication (HCC) Active Problems:   MDD (major depressive disorder), recurrent severe, without psychosis ([place])  Long Term Goal(s): Improvement in symptoms so as ready for discharge  Short Term Goals: Ability to identify changes in lifestyle to reduce recurrence of condition will improve Ability to verbalize feelings will improve Ability to disclose and discuss suicidal ideas Ability to demonstrate self-control will improve Ability to identify and develop effective coping behaviors will improve Ability to maintain clinical measurements within normal limits will improve Compliance with prescribed medications will improve Ability to identify triggers associated with substance abuse/mental health issues will improve Ability to identify changes in lifestyle to reduce recurrence of condition will improve Ability to verbalize feelings will improve Ability to disclose and discuss suicidal ideas Ability to demonstrate self-control  will improve Ability to identify and develop effective coping behaviors will improve Ability to maintain clinical measurements within normal limits will improve Compliance with prescribed medications will improve Ability to identify triggers associated with substance abuse/mental health issues will improve  Medication Management: Evaluate patient's response, side effects, and tolerance of medication regimen.  Therapeutic Interventions: 1 to 1 sessions, Unit Group sessions and Medication administration.  Evaluation of Outcomes: Not Met  RN Treatment Plan for Primary Diagnosis: Alcohol withdrawal syndrome without complication (HCC) Long Term Goal(s): Knowledge  of disease and therapeutic regimen to maintain health will improve  Short Term Goals: Ability to identify and develop effective coping behaviors will improve and Compliance with prescribed medications will improve  Medication Management: RN will administer medications as ordered by provider, will assess and evaluate patient's response and provide education to patient for prescribed medication. RN will report any adverse and/or side effects to prescribing provider.  Therapeutic Interventions: 1 on 1 counseling sessions, Psychoeducation, Medication administration, Evaluate responses to treatment, Monitor vital signs and CBGs as ordered, Perform/monitor CIWA, COWS, AIMS and Fall Risk screenings as ordered, Perform wound care treatments as ordered.  Evaluation of Outcomes: Not Met  LCSW Treatment Plan for Primary Diagnosis: Alcohol withdrawal syndrome without complication ([place]) Long Term Goal(s): Safe transition to appropriate next level of care at discharge, Engage patient in therapeutic group addressing interpersonal concerns. Short Term Goals: Engage patient in aftercare planning with referrals and resources, Increase ability to appropriately verbalize feelings, Facilitate patient progression through stages of change regarding substance use diagnoses and concerns, Identify triggers associated with mental health/substance abuse issues and Increase skills for wellness and recovery  Therapeutic Interventions: Assess for all discharge needs, 1 to 1 time with Social worker, Explore available resources and support systems, Assess for adequacy in community support network, Educate family and significant other(s) on suicide prevention, Complete Psychosocial Assessment, Interpersonal group therapy.  Evaluation of Outcomes: Not Met  Progress in Treatment: Attending groups: Pt is new to milieu, continuing to assess  Participating in groups: Pt is new to milieu, continuing to assess  Taking medication as  prescribed: Yes, MD continues to assess for medication changes as needed Toleration medication: Yes, no side effects reported at this time Family/Significant other contact made: No, pt declined contact. Patient understands diagnosis: Continuing to assess Discussing patient identified problems/goals with staff: Yes Medical problems stabilized or resolved: Yes Denies suicidal/homicidal ideation: No, pt recently admitted with SI. Issues/concerns per patient self-inventory: None Other: N/A  New problem(s) identified: None identified at this time.   New Short Term/Long Term Goal(s): "I'd like to stay sober"  Discharge Plan or Barriers: Pt will likely discharge to a treatment facility. Referrals being made to [place].  Reason for Continuation of Hospitalization:   Depression Medication stabilization Suicidal ideation Withdrawal symptoms  Estimated Length of Stay: 3-5 days; Estimated discharge date 09/03/16  Attendees: Patient: 08/29/2016 9:21 AM  Physician:  08/29/2016 9:21 AM  Nursing: Jake Sidles, RN 08/29/2016 9:21 AM  RN Care Manager: 08/29/2016 9:21 AM  Social Worker: Matthew Saras, [place] 08/29/2016 9:21 AM  Recreational Therapist:  08/29/2016 9:21 AM  Other: Lindell Spar, NP 08/29/2016 9:21 AM  Other:  08/29/2016 9:21 AM  Other: 08/29/2016 9:21 AM  Scribe for Treatment Team: Georga Kaufmann, MSW,LCSWA 08/29/2016 9:21 AM

## 2016-08-29 NOTE — [facility] Group Notes (Signed)
[facility] LCSW Group Therapy 08/29/2016 1:15pm  Type of Therapy: Group Therapy- Feelings Around Relapse and Recovery  Participation Level: Pt invited. Did not attend.    Donnelly Stager, MSW, Theresia Majors [phone] 08/29/2016 4:11 PM

## 2016-08-29 NOTE — Progress Notes (Signed)
Nursing Progress Note 1900-0730  D) Patient presents calm and cooperative but is isolative to his room. Patient reports he feels some relief with PRN ativan from previous shift but is resting in bed. Patient provided sleep medications and pitcher of gatorade. BP monitored and WNL. Patient denies SI/HI/AVH or pain. Patient contracts for safety on the unit.   A) Emotional support given. 1:1 interaction and active listening provided. Patient medicated as prescribed. Medications and plan of care reviewed with patient. Patient verbalized understanding without further questions. Snacks and fluids provided. Opportunities for questions or concerns presented to patient. Patient encouraged to continue to work on treatment goals. Labs, vital signs and patient behavior monitored throughout shift. Patient safety maintained with q15 min safety checks. Low fall risk precautions in place and reviewed with patient; patient verbalized understanding.  R) Patient receptive to interaction with nurse. Patient remains safe on the unit at this time. Patient denies any adverse medication reactions at this time. Patient is resting in bed without complaints. Will continue to monitor.

## 2016-08-30 NOTE — Progress Notes (Signed)
Jake [name] has spent much of his day in his bed today. HE remians dressed in paper purple hospital scrubs. He is depressed, endorses a flat, unemotional affect and makes minimal eye contact with this Clinical research associate. A He completes his daily assessment and on this he writes  He has experienced SI today and he rated hsid epression, hopelessness and anxiety " 09/21/08", respectively. He ha spent minimal time in the dayroom and isolates form his peers. His morning ativan was held by this Clinical research associate 2' pt c/o lightheadedness and bp 107/64. This afternoon, h requested ativanand it was given bp 103/63 and pt reports having no lightheaded and having withdrawal sx. R Safety in place.

## 2016-08-30 NOTE — Progress Notes (Signed)
[name] MD Progress Note  08/30/2016 1:37 PM Jake [name]  MRN:  [id number] Subjective:   46 yo Caucasian male, single, never married, no kids, homeless and unemployed. Self presented to the ER seeking help for alcohol use. Background history of AUD and associated mood disorder. Drinks daily and had his last drink two hour prior to presentation. Had presented a couple of days earlier seeking help. This time expressed thoughts of jumping in front of a train or off the bridge. BAL at presentation was 323 mg/dl. AST 418, ALT 312. UDS and other laboratory parameters were essentially normal.  Chart reviewed today. Patient discussed at team today.  Staff reports that he is isolated mostly. Limited interactions and engagement at groups. No complications from alcohol withdrawals. No behavioral issues. More pleasant when approached.   Seen today. In bed after launch. Tolerated lunch well. Says tremors are less. No retching, no nausea or vomiting. No tactile or visual hallucinations. No auditory hallucinations. No suicidal thoughts. Asked me how long he could stay here. Says he has been looking at possible dispositions.    Principal Problem: Alcohol withdrawal syndrome without complication (HCC) Diagnosis:   Patient Active Problem List   Diagnosis Date Noted  . MDD (major depressive disorder), recurrent severe, without psychosis (HCC) [F33.2] 08/27/2016  . Acute bronchitis [J20.9] 06/03/2016  . Homeless [Z59.0] 06/03/2016  . Alcohol withdrawal (HCC) [F10.239] 06/02/2016  . Tobacco abuse [Z72.0] 06/02/2016  . Protein-calorie malnutrition, severe (HCC) [E43] 07/10/2015  . Transaminitis [R74.0] 07/07/2015  . Hypokalemia [E87.6] 07/07/2015  . Alcohol withdrawal delirium, acute, mixed level of activity (HCC) [F10.231] 07/07/2015  . 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 Alcohol-induced mood disorder (HCC) [F10.94] 04/06/2015  . Alcohol dependence with  alcohol-induced mood disorder (HCC) [F10.24] 03/09/2014  . Polysubstance abuse [F19.10] 03/09/2014  . Opioid dependence (HCC) [F11.20] 03/09/2014  . Alcohol use disorder, severe, dependence (HCC) [F10.20] 03/08/2014  . Substance induced mood disorder (HCC) [F19.94] 03/08/2014  . Alcohol abuse [F10.10] 06/28/2013  . Alcohol withdrawal syndrome without complication (HCC) [F10.230] 06/28/2013  . DTs (delirium tremens) (HCC) [F10.231] 06/28/2013  . Marijuana abuse [F12.10] 04/28/2011  . Alcoholism (HCC) [F10.20] 04/27/2011  . Toxic encephalopathy [G92] 04/27/2011  . Psychosis [F29] 04/27/2011  . Leukocytosis [D72.829] 04/27/2011  . Hyponatremia [E87.1] 04/27/2011  . Thrombocytopenia (HCC) [D69.6] 04/27/2011  . Elevated AST (SGOT) [R74.0] 04/27/2011  . Elevated bilirubin [R17] 04/27/2011   Total Time spent with patient: 20 minutes  Past Psychiatric History: As in H&P  Past Medical History:  Past Medical History:  Diagnosis Date  . Anxiety   . Depression   . Elevated liver enzymes   . ETOH abuse   . Hip dislocation   . Seizures (HCC)    ETOH withdrawl   History reviewed. No pertinent surgical history. Family History:  Family History  Problem Relation Age of Onset  . Heart attack Father    Family Psychiatric  History: As in H&P Social History:  History  Alcohol Use  . 4.8 oz/week  . 3 Cans of beer, 5 Shots of liquor per week    Comment: daily     History  Drug Use No    Comment: denies any drug use since he was seen for a heroin overdose on 07/07/15     Social History   Social History  . Marital status: Single    Spouse name: N/A  . Number of children: N/A  . Years of education: N/A  Social History Main Topics  . Smoking status: Current Every Day Smoker    Packs/day: 2.00    Types: Cigarettes  . Smokeless tobacco: Never Used  . Alcohol use 4.8 oz/week    3 Cans of beer, 5 Shots of liquor per week     Comment: daily  . Drug use: No     Comment: denies any  drug use since he was seen for a heroin overdose on 07/07/15   . Sexual activity: Yes   Other Topics Concern  . None   Social History Narrative  . None   Additional Social History:      Sleep: Good  Appetite:  Good  Current Medications: Current Facility-Administered Medications  Medication Dose Route Frequency Provider Last Rate Last Dose  . acetaminophen (TYLENOL) tablet 650 mg  650 mg Oral Q6H PRN Laveda Abbe, NP   650 mg at 08/27/16 2211  . alum & mag hydroxide-simeth (MAALOX/MYLANTA) 200-200-20 MG/5ML suspension 30 mL  30 mL Oral Q4H PRN Laveda Abbe, NP      . haloperidol (HALDOL) tablet 5 mg  5 mg Oral Q6H PRN Laveda Abbe, NP   5 mg at 08/29/16 2241   And  . benztropine (COGENTIN) tablet 1 mg  1 mg Oral Q6H PRN Laveda Abbe, NP   1 mg at 08/28/16 1729  . feeding supplement (ENSURE ENLIVE) (ENSURE ENLIVE) liquid 237 mL  237 mL Oral TID BM Cobos, Rockey Situ, MD   237 mL at 08/27/16 2054  . hydrOXYzine (ATARAX/VISTARIL) tablet 25 mg  25 mg Oral Q6H PRN Laveda Abbe, NP   25 mg at 08/29/16 2241  . loperamide (IMODIUM) capsule 2-4 mg  2-4 mg Oral PRN Laveda Abbe, NP      . LORazepam (ATIVAN) tablet 1 mg  1 mg Oral Q6H PRN Laveda Abbe, NP   1 mg at 08/29/16 2241  . LORazepam (ATIVAN) tablet 1 mg  1 mg Oral BID Laveda Abbe, NP   1 mg at 08/29/16 1839   Followed by  . [START ON 08/31/2016] LORazepam (ATIVAN) tablet 1 mg  1 mg Oral Daily Laveda Abbe, NP      . magnesium hydroxide (MILK OF MAGNESIA) suspension 30 mL  30 mL Oral Daily PRN Laveda Abbe, NP      . multivitamin with minerals tablet 1 tablet  1 tablet Oral Daily Laveda Abbe, NP   1 tablet at 08/30/16 0831  . naltrexone (DEPADE) tablet 50 mg  50 mg Oral Daily Georgiann Cocker, MD   50 mg at 08/30/16 0831  . nicotine (NICODERM CQ - dosed in mg/24 hours) patch 21 mg  21 mg Transdermal Daily Cobos, Rockey Situ, MD   21 mg at  08/30/16 0831  . ondansetron (ZOFRAN-ODT) disintegrating tablet 4 mg  4 mg Oral Q6H PRN Laveda Abbe, NP   4 mg at 08/27/16 2055  . thiamine (VITAMIN B-1) tablet 100 mg  100 mg Oral Daily Laveda Abbe, NP   100 mg at 08/30/16 0831  . traZODone (DESYREL) tablet 50 mg  50 mg Oral QHS PRN Laveda Abbe, NP   50 mg at 08/29/16 2241    Lab Results: No results found for this or any previous visit (from the past 48 hour(s)).  Blood Alcohol level:  Lab Results  Component Value Date   ETH 323 (HH) 08/26/2016   ETH 221 (H) 08/18/2016    Metabolic Disorder  Labs: No results found for: HGBA1C, MPG No results found for: PROLACTIN No results found for: CHOL, TRIG, HDL, CHOLHDL, VLDL, LDLCALC  Physical Findings: AIMS: Facial and Oral Movements Muscles of Facial Expression: None, normal Lips and Perioral Area: None, normal Jaw: None, normal Tongue: None, normal,Extremity Movements Upper (arms, wrists, hands, fingers): None, normal Lower (legs, knees, ankles, toes): None, normal, Trunk Movements Neck, shoulders, hips: None, normal, Overall Severity Severity of abnormal movements (highest score from questions above): None, normal Incapacitation due to abnormal movements: None, normal Patient's awareness of abnormal movements (rate only patient's report): No Awareness, Dental Status Current problems with teeth and/or dentures?: No Does patient usually wear dentures?: No  CIWA:  CIWA-Ar Total: 2 COWS:     Musculoskeletal: Strength & Muscle Tone: within normal limits Gait & Station: broad based Patient leans: N/A  Psychiatric Specialty Exam: Physical Exam  Constitutional: He is oriented to person, place, and time. No distress.  HENT:  Head: Normocephalic and atraumatic.  Respiratory: Effort normal.  Neurological: He is alert and oriented to person, place, and time.  Skin: He is not diaphoretic.  Psychiatric:  As above    ROS  Blood pressure 107/64, pulse 64,  temperature 98.3 F (36.8 C), temperature source Oral, resp. rate 18, height 5\' 11"  (1.803 m), weight 59 kg (130 lb).Body mass index is 18.13 kg/m.  General Appearance: In bed, no longer flushed. No tremors, not sweaty. Not confused or internally stimulated.   Eye Contact:  Good  Speech:  Clear and Coherent and Normal Rate  Volume:  Normal  Mood:  Mood is improving  Affect:  Restricted and mood congruent.   Thought Process:  Linear  Orientation:  Full (Time, Place, and Person)  Thought Content:  No delusional theme. No preoccupation with violent thoughts. No negative ruminations. No obsession.  No hallucination in any modality.   Suicidal Thoughts:  No  Homicidal Thoughts:  No  Memory:  WNL  Judgement: Better  Insight:  Good  Psychomotor Activity:  Decreased  Concentration:  Better  Recall:  WNL  Fund of Knowledge:  Good  Language:  Good  Akathisia:  Negative  Handed:    AIMS (if indicated):     Assets:  Desire for Improvement Resilience  ADL's:  Impaired  Cognition:  WNL  Sleep:  Number of Hours: 5.5     Assessment and Plan   Patient is smoothly coming off alcohol. Mood is gradually lifting. We plan to taper his benzodiazepines further today. Hopeful discharge early next week.   Psychiatric: AUD SIMD  Medical: HTN  Psychosocial:  Homelessness Unemployment No support  PLAN: 1. Continue medications at current dose 2. Continue to monitor mood, behavior and interactions  Georgiann Cocker, MD 08/30/2016, 1:37 PMPatient ID: Jake [name], male   DOB: 07/21/1970, 46 y.o.   MRN: [id number]

## 2016-08-30 NOTE — [facility] Group Notes (Signed)
[facility] LCSW Group Therapy Note  08/16/2016 at  10:20 to 11:15 AM  Type of Therapy and Topic:  Group Therapy: Avoiding Self-Sabotaging and Enabling Behaviors  Participation Level:  Did Not Attend; invited to participate yet did not despite overhead announcement and encouragement by staff   Description of Group The main focus of today's process group to discuss what "self-sabotage" means and use motivational iInterviewing to discuss what benefits, negative or positive, were involved in a self-identified self-sabotaging behavior. We then talked about reasons the patient may want to change the behavior and their current desire to change.   Therapeutic molalities: Cognitive Behavioral Therapy Person-Centered Therapy Motivational Interviewing  Therapeutic Goals: 1. Patients will demonstrate understanding of the concept of self sabotage 2. Patients will be able to identify pros and cons of their behaviors 3. Patients will be able to identify at least two motivating factors for l of their desire for change   Catherine C Harrill, LCSW 

## 2016-08-30 NOTE — Progress Notes (Signed)
D    Pt endorses depression and some moderate withdrawal symptoms   He was also worried about not getting any sleep    He is pleasant on approach and cooperative   He did not attend group but is otherwise compliant with treatment A    Verbal support given    Medications administered and effectiveness monitored   Q 15 min checks R   Pt is safe at present time and receptive to verbal support

## 2016-08-31 NOTE — Progress Notes (Signed)
D    Pt endorses depression and some moderate withdrawal symptoms      He is pleasant on approach and cooperative   He did not attend group but is otherwise compliant with treatment A    Verbal support given    Medications administered and effectiveness monitored   Q 15 min checks R   Pt is safe at present time and receptive to verbal support

## 2016-08-31 NOTE — Progress Notes (Signed)
[facility] MD Progress Note  08/31/2016 3:26 PM Jake [name]  MRN:  [id number] Subjective:   46 yo Caucasian male, single, never married, no kids, homeless and unemployed. Self presented to the ER seeking help for alcohol use. Background history of AUD and associated mood disorder. Drinks daily and had his last drink two hour prior to presentation. Had presented a couple of days earlier seeking help. This time expressed thoughts of jumping in front of a train or off the bridge. BAL at presentation was 323 mg/dl. AST 418, ALT 312. UDS and other laboratory parameters were essentially normal.  Chart reviewed today. Patient discussed at team today.  Staff reports that he up more. Some group attendance lately. He has not voiced any futility thoughts. He has not been observed to be internally preoccupied. Vitals and CIWA scores are stabilizing.   Seen today. Sitting out of his bed for the first time. He was reading a magazine. Tells me that he is feeling better. No longer having shakes. Says he is no longer sweaty. Feels he has completely come off the withdrawals. Says he attended some AA here. He hopes he can get into [employer] or [employer]. Plans to go to meetings and associate with people that would help him stay sober. No longer having suicidal thoughts. No abnormal perception. Patient does not have any abnormal belief. No thoughts of violence or homicide.  Principal Problem: Alcohol withdrawal syndrome without complication (HCC) Diagnosis:   Patient Active Problem List   Diagnosis Date Noted  . MDD (major depressive disorder), recurrent severe, without psychosis (HCC) [F33.2] 08/27/2016  . Acute bronchitis [J20.9] 06/03/2016  . Homeless [Z59.0] 06/03/2016  . Alcohol withdrawal (HCC) [F10.239] 06/02/2016  . Tobacco abuse [Z72.0] 06/02/2016  . Protein-calorie malnutrition, severe (HCC) [E43] 07/10/2015  . Transaminitis [R74.0] 07/07/2015  . Hypokalemia [E87.6] 07/07/2015  . Alcohol  withdrawal delirium, acute, mixed level of activity (HCC) [F10.231] 07/07/2015  . 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 Alcohol-induced mood disorder (HCC) [F10.94] 04/06/2015  . 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 Polysubstance abuse [F19.10] 03/09/2014  . Opioid dependence (HCC) [F11.20] 03/09/2014  . Alcohol use disorder, severe, dependence (HCC) [F10.20] 03/08/2014  . Substance induced mood disorder (HCC) [F19.94] 03/08/2014  . Alcohol abuse [F10.10] 06/28/2013  . Alcohol withdrawal syndrome without complication (HCC) [F10.230] 06/28/2013  . DTs (delirium tremens) (HCC) [F10.231] 06/28/2013  . Marijuana abuse [F12.10] 04/28/2011  . Alcoholism (HCC) [F10.20] 04/27/2011  . Toxic encephalopathy [G92] 04/27/2011  . Psychosis [F29] 04/27/2011  . Leukocytosis [D72.829] 04/27/2011  . Hyponatremia [E87.1] 04/27/2011  . Thrombocytopenia (HCC) [D69.6] 04/27/2011  . Elevated AST (SGOT) [R74.0] 04/27/2011  . Elevated bilirubin [R17] 04/27/2011   Total Time spent with patient: 20 minutes  Past Psychiatric History: As in H&P  Past Medical History:  Past Medical History:  Diagnosis Date  . Anxiety   . Depression   . Elevated liver enzymes   . ETOH abuse   . Hip dislocation   . Seizures (HCC)    ETOH withdrawl   History reviewed. No pertinent surgical history. Family History:  Family History  Problem Relation Age of Onset  . Heart attack Father    Family Psychiatric  History: As in H&P Social History:  History  Alcohol Use  . 4.8 oz/week  . 3 Cans of beer, 5 Shots of liquor per week    Comment: daily     History  Drug Use  No    Comment: denies any drug use since he was seen for a heroin overdose on 07/07/15     Social History   Social History  . Marital status: Single    Spouse name: N/A  . Number of children: N/A  . Years of education: N/A   Social History Main Topics  .  Smoking status: Current Every Day Smoker    Packs/day: 2.00    Types: Cigarettes  . Smokeless tobacco: Never Used  . Alcohol use 4.8 oz/week    3 Cans of beer, 5 Shots of liquor per week     Comment: daily  . Drug use: No     Comment: denies any drug use since he was seen for a heroin overdose on 07/07/15   . Sexual activity: Yes   Other Topics Concern  . None   Social History Narrative  . None   Additional Social History:      Sleep: Good  Appetite:  Good  Current Medications: Current Facility-Administered Medications  Medication Dose Route Frequency Provider Last Rate Last Dose  . acetaminophen (TYLENOL) tablet 650 mg  650 mg Oral Q6H PRN Laveda Abbe, NP   650 mg at 08/27/16 2211  . alum & mag hydroxide-simeth (MAALOX/MYLANTA) 200-200-20 MG/5ML suspension 30 mL  30 mL Oral Q4H PRN Laveda Abbe, NP      . haloperidol (HALDOL) tablet 5 mg  5 mg Oral Q6H PRN Laveda Abbe, NP   5 mg at 08/30/16 2225   And  . benztropine (COGENTIN) tablet 1 mg  1 mg Oral Q6H PRN Laveda Abbe, NP   1 mg at 08/28/16 1729  . feeding supplement (ENSURE ENLIVE) (ENSURE ENLIVE) liquid 237 mL  237 mL Oral TID BM Cobos, Rockey Situ, MD   237 mL at 08/31/16 0807  . hydrOXYzine (ATARAX/VISTARIL) tablet 25 mg  25 mg Oral Q6H PRN Laveda Abbe, NP   25 mg at 08/31/16 1254  . magnesium hydroxide (MILK OF MAGNESIA) suspension 30 mL  30 mL Oral Daily PRN Laveda Abbe, NP      . multivitamin with minerals tablet 1 tablet  1 tablet Oral Daily Laveda Abbe, NP   1 tablet at 08/31/16 4035  . naltrexone (DEPADE) tablet 50 mg  50 mg Oral Daily Georgiann Cocker, MD   50 mg at 08/31/16 0807  . nicotine (NICODERM CQ - dosed in mg/24 hours) patch 21 mg  21 mg Transdermal Daily Cobos, Rockey Situ, MD   21 mg at 08/31/16 0817  . thiamine (VITAMIN B-1) tablet 100 mg  100 mg Oral Daily Laveda Abbe, NP   100 mg at 08/31/16 2481  . traZODone (DESYREL)  tablet 50 mg  50 mg Oral QHS PRN Laveda Abbe, NP   50 mg at 08/30/16 2225    Lab Results: No results found for this or any previous visit (from the past 48 hour(s)).  Blood Alcohol level:  Lab Results  Component Value Date   ETH 323 (HH) 08/26/2016   ETH 221 (H) 08/18/2016    Metabolic Disorder Labs: No results found for: HGBA1C, MPG No results found for: PROLACTIN No results found for: CHOL, TRIG, HDL, CHOLHDL, VLDL, LDLCALC  Physical Findings: AIMS: Facial and Oral Movements Muscles of Facial Expression: None, normal Lips and Perioral Area: None, normal Jaw: None, normal Tongue: None, normal,Extremity Movements Upper (arms, wrists, hands, fingers): None, normal Lower (legs, knees, ankles, toes): None, normal, Trunk Movements  Neck, shoulders, hips: None, normal, Overall Severity Severity of abnormal movements (highest score from questions above): None, normal Incapacitation due to abnormal movements: None, normal Patient's awareness of abnormal movements (rate only patient's report): No Awareness, Dental Status Current problems with teeth and/or dentures?: No Does patient usually wear dentures?: No  CIWA:  CIWA-Ar Total: 5 COWS:     Musculoskeletal: Strength & Muscle Tone: within normal limits Gait & Station: broad based Patient leans: N/A  Psychiatric Specialty Exam: Physical Exam  Constitutional: He is oriented to person, place, and time. No distress.  HENT:  Head: Normocephalic and atraumatic.  Respiratory: Effort normal.  Neurological: He is alert and oriented to person, place, and time.  Skin: He is not diaphoretic.  Psychiatric:  As above    ROS  Blood pressure 92/63, pulse 79, temperature 98 F (36.7 C), resp. rate 18, height 5\' 11"  (1.803 m), weight 59 kg (130 lb).Body mass index is 18.13 kg/m.  General Appearance: In hospital clothing. Was reading just before interview. Better rapport today.  No tremors, not sweaty. Not internally stimulated.    Eye Contact:  Good  Speech:  Clear and Coherent and Normal Rate  Volume:  Normal  Mood:  Euthymic  Affect:  Mobilizing positive affect appropriately    Thought Process:  Linear  Orientation:  Full (Time, Place, and Person)  Thought Content:  Future oriented. No delusional theme. No preoccupation with violent thoughts. No negative ruminations. No obsession.  No hallucination in any modality.   Suicidal Thoughts:  No  Homicidal Thoughts:  No  Memory:  WNL  Judgement: Good  Insight:  Good  Psychomotor Activity:  Normal  Concentration:  Good  Recall:  WNL  Fund of Knowledge:  Good  Language:  Good  Akathisia:  Negative  Handed:    AIMS (if indicated):     Assets:  Desire for Improvement Resilience  ADL's:  Impaired  Cognition:  WNL  Sleep:  Number of Hours: 6.5     Assessment and Plan   Patient has completely come off alcohol. He had his last dose of benzodiazepine this morning. He is not psychotic. He is not a danger to himself or others. We plan to evaluate him overnight. Hopeful discharge tomorrow.   Psychiatric: AUD SIMD  Medical: HTN  Psychosocial:  Homelessness Unemployment No support  PLAN: 1. Continue medications at current dose 2. Continue to monitor mood, behavior and interactions  Georgiann Cocker, MD 08/31/2016, 3:26 PMPatient ID: Jake [name], male   DOB: 26-Oct-1970, 46 y.o.   MRN: [id number] Patient ID: Jake [name], male   DOB: [birth year], 46 y.o.   MRN: [id number]

## 2016-08-31 NOTE — Progress Notes (Signed)
D Pt  has spent much of his day asleep in his bed today. HE gets up for meals , but then he returns to his bed and stays there until the next meal. A He ompleted his daily assessment and on it he wrote he deneid SI today and he rated his depression, hopelessness and anxeity " 9/9/9/", respectively. R Safety in place. He states he hopes he can find out tomorrow what his dc staus is.

## 2016-08-31 NOTE — Progress Notes (Signed)
D    Pt endorses depression and some moderate withdrawal symptoms      He is pleasant on approach and cooperative   He did not attend group but is otherwise compliant with treatment  He reports some improvement and has performed grooming today  A    Verbal support given    Medications administered and effectiveness monitored   Q 15 min checks R   Pt is safe at present time and receptive to verbal support

## 2016-08-31 NOTE — [facility] Group Notes (Signed)
[facility] LCSW Group Therapy Note     08/31/2016 10:15 to 11:15 AM  Type of Therapy and Topic: Group Therapy: Feelings Around Returning Home & Establishing a Supportive Framework and Supporting Oneself When Supports Not Available  Participation Level: Minimal   Description of Group:  Patients first processed thoughts and feelings about up coming discharge. These included fears of upcoming changes, lack of change, new living environments, judgements and expectations from others and overall stigma of MH issues. We then discussed what is a supportive framework? What does it look like feel like and how do I discern it from and unhealthy non-supportive network? Learn how to cope when supports are not helpful and don't support you. Discuss what to do when your family/friends are not supportive.   Therapeutic Goals Addressed in Processing Group:  1. Patient will identify one healthy supportive network that they can use at discharge. 2. Patient will identify one factor of a supportive framework and how to tell it from an unhealthy network. 3. Patient able to identify one coping skill to use when they do not have positive supports from others. 4. Patient will demonstrate ability to communicate their needs through discussion and/or role plays.  Summary of Patient Progress:  Pt did not engage during group session even when asked direct questions. As patients processed their anxiety about discharge and described healthy supports patient remained quiet yet present; he did not appeared to be attentive.   Carney Bern, LCSW

## 2016-08-31 NOTE — Progress Notes (Signed)
Patient did attend the evening speaker AA meeting.  

## 2016-09-01 MED ORDER — HYDROXYZINE HCL 25 MG PO TABS: 25.0000 mg | ORAL_TABLET | Freq: Four times a day (QID) | ORAL | 0 refills | Status: DC | PRN

## 2016-09-01 MED ORDER — NICOTINE 21 MG/24HR TD PT24: 21.0000 mg | MEDICATED_PATCH | Freq: Every day | TRANSDERMAL | 0 refills | Status: DC

## 2016-09-01 MED ORDER — TRAZODONE HCL 50 MG PO TABS: 50.0000 mg | ORAL_TABLET | Freq: Every evening | ORAL | 0 refills | Status: DC | PRN

## 2016-09-01 MED ORDER — NALTREXONE HCL 50 MG PO TABS: 50.0000 mg | ORAL_TABLET | Freq: Every day | ORAL | 0 refills | Status: DC

## 2016-09-01 NOTE — Progress Notes (Signed)
  [facility] Adult Case Management Discharge Plan :  Will you be returning to the same living situation after discharge:  Yes,  pt returning to same living conditions. At discharge, do you have transportation home?: Yes,  pt has access to transportation. Do you have the ability to pay for your medications: Yes,  prescriptions and samples provided.  Release of information consent forms completed and in the chart;  Patient's signature needed at discharge.  Patient to Follow up at: Follow-up Information    [facility].   Specialty:  Behavioral Health Why:  Please go for a walk-in appointment to be established for intensive outpatient services. Walk-in hours are Mon, Wed, Fri 12p-3pm. Please arrive as early as possible to be sure that you are seen. Thank you. Contact information: [address] [place] [address] [phone]        [facility] Follow up.   Why:  Social worker has made a referral on your behalf for residential treatment. Please continue to call to check for bed availability. Thank you. Contact information: [address] [place], [place] [address] P: [phone] F: [phone]          Next level of care provider has access to [facility] Link:no  Safety Planning and Suicide Prevention discussed: Yes,  with pt.  Have you used any form of tobacco in the last 30 days? (Cigarettes, Smokeless Tobacco, Cigars, and/or Pipes): Yes  Has patient been referred to the Quitline?: Patient refused referral  Patient has been referred for addiction treatment: Yes  Jonathon Jordan, MSW, LCSWA 09/01/2016, 9:50 AM

## 2016-09-01 NOTE — Progress Notes (Signed)
Recreation Therapy Notes  Date: 09/01/16 Time: 0930 Location: [address] Dayroom  Group Topic: Stress Management  Goal Area(s) Addresses:  Patient will verbalize importance of using healthy stress management.  Patient will identify positive emotions associated with healthy stress management.   Intervention: Stress Management  Activity :  Peaceful Waves.  LRT introduced the stress management technique of guided imagery.  LRT played the sounds of waves at the beach from Youtube and read [name] script to allow patients to visualize being at the beach at sunrise.  Patients were to follow along as LRT read script to engage in the activity.    Education:  Stress Management, Discharge Planning.   Education Outcome: Acknowledges edcuation/In group clarification offered/Needs additional education  Clinical Observations/Feedback: Pt did not attend group.   Cellie Dardis, LRT/CTRS         Jake [name] 09/01/2016 11:13 AM 

## 2016-09-01 NOTE — Progress Notes (Signed)
Patient ID: Jake [name], male   DOB: 05-Mar-1970, 46 y.o.   MRN: [id number]  Discharge Note: Belongings returned to patient at time of discharge. Discharge instructions and medications were reviewed with patient. Patient verbalized understanding of both medications and discharge instructions. Patient discharged to lobby and provided with a bus pass. Patient discharged with no apparent distress. Q15 minute safety checks maintained until discharge.

## 2016-09-01 NOTE — Discharge Summary (Signed)
Physician Discharge Summary Note  Patient:  Jake [name] is an 46 y.o., male MRN:  [id number] DOB:  1970-04-27 Patient phone:  [phone] (home)  Patient address:   [place] [address],  Total Time spent with patient: 30 minutes  Date of Admission:  08/27/2016 Date of Discharge: 09/01/2016  Reason for Admission: Per HPI- 46 yo Caucasian male, single, never married, no kids, homeless and unemployed. Self presented to the ER seeking help for alcohol use. Background history of AUD and associated mood disorder. Drinks daily and had his last drink two hour prior to presentation. Had presented a couple of days earlier seeking help. This time expressed thoughts of jumping in front of a train or off the bridge. BAL at presentation was 323 mg/dl. AST 418, ALT 312. UDS and other laboratory parameters were essentially normal.  At interview, states that he has been drinking since his early teens. Was hearing things and seeing things prior to hospitalization. Hallucinations resolved with benzodiazepine treatment. Patient reports history of seizures while coming of alcohol. He usually drinks until he passes out. Has had falls over the years. Has had blackout over the years. Reports mood swings while under the influence. Gets into sleep while drunk but wakes up early in the morning. Currently not feeling as down as he was earlier. Says he has not had any hallucinations but things are a bit distorted at times. No paranoia or any other form of delusion. No feelings of being taken over by an external force. No current suicidal thoughts. No thoughts of violence. No homicidal thoughts. He has been able to hold down his meal. No diarrhea.  Patient states that his main stressor is being homeless. No legal issues. No interpersonal difficulties. He does not have access to weapons. Does not abuse any other substance. Says he was given opiates once while intoxicated. He passed out but was revived by his friend who  had Narcan  Principal Problem: Alcohol withdrawal syndrome without complication Jake [name]) Discharge Diagnoses: Patient Active Problem List   Diagnosis Date Noted  . MDD (major depressive disorder), recurrent severe, without psychosis (HCC) [F33.2] 08/27/2016  . Acute bronchitis [J20.9] 06/03/2016  . Homeless [Z59.0] 06/03/2016  . Alcohol withdrawal (HCC) [F10.239] 06/02/2016  . Tobacco abuse [Z72.0] 06/02/2016  . Protein-calorie malnutrition, severe (HCC) [E43] 07/10/2015  . Transaminitis [R74.0] 07/07/2015  . Hypokalemia [E87.6] 07/07/2015  . Alcohol withdrawal delirium, acute, mixed level of activity (HCC) [F10.231] 07/07/2015  . 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 Alcohol-induced mood disorder (HCC) [F10.94] 04/06/2015  . 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 Polysubstance abuse [F19.10] 03/09/2014  . Opioid dependence (HCC) [F11.20] 03/09/2014  . Alcohol use disorder, severe, dependence (HCC) [F10.20] 03/08/2014  . Substance induced mood disorder (HCC) [F19.94] 03/08/2014  . Alcohol abuse [F10.10] 06/28/2013  . Alcohol withdrawal syndrome without complication (HCC) [F10.230] 06/28/2013  . DTs (delirium tremens) (HCC) [F10.231] 06/28/2013  . Marijuana abuse [F12.10] 04/28/2011  . Alcoholism (HCC) [F10.20] 04/27/2011  . Toxic encephalopathy [G92] 04/27/2011  . Psychosis [F29] 04/27/2011  . Leukocytosis [D72.829] 04/27/2011  . Hyponatremia [E87.1] 04/27/2011  . Thrombocytopenia (HCC) [D69.6] 04/27/2011  . Elevated AST (SGOT) [R74.0] 04/27/2011  . Elevated bilirubin [R17] 04/27/2011    Past Psychiatric History:   Past Medical History:  Past Medical History:  Diagnosis Date  . Anxiety   . Depression   . Elevated liver enzymes   . ETOH abuse   . Hip dislocation   .  Seizures (HCC)    ETOH withdrawl   History reviewed. No pertinent surgical history. Family History:  Family  History  Problem Relation Age of Onset  . Heart attack Father    Family Psychiatric  History:  Social History:  History  Alcohol Use  . 4.8 oz/week  . 3 Cans of beer, 5 Shots of liquor per week    Comment: daily     History  Drug Use No    Comment: denies any drug use since he was seen for a heroin overdose on 07/07/15     Social History   Social History  . Marital status: Single    Spouse name: N/A  . Number of children: N/A  . Years of education: N/A   Social History Main Topics  . Smoking status: Current Every Day Smoker    Packs/day: 2.00    Types: Cigarettes  . Smokeless tobacco: Never Used  . Alcohol use 4.8 oz/week    3 Cans of beer, 5 Shots of liquor per week     Comment: daily  . Drug use: No     Comment: denies any drug use since he was seen for a heroin overdose on 07/07/15   . Sexual activity: Yes   Other Topics Concern  . None   Social History Narrative  . None    Hospital Course:  Jake [name] was admitted for Alcohol withdrawal syndrome without complication (HCC) and crisis management.  Pt was treated discharged with the medications listed below under Medication List.  Medical problems were identified and treated as needed.  Home medications were restarted as appropriate.  Improvement was monitored by observation and Jake [name] 's daily report of symptom reduction.  Emotional and mental status was monitored by daily self-inventory reports completed by Jake [name] and clinical staff.         Jake [name] was evaluated by the treatment team for stability and plans for continued recovery upon discharge. Jake [name] 's motivation was an integral factor for scheduling further treatment. Employment, transportation, bed availability, health status, family support, and any pending legal issues were also considered during hospital stay. Pt was offered further treatment options upon discharge including but not limited to Residential, Intensive  Outpatient, and Outpatient treatment.  Jake [name] will follow up with the services as listed below under Follow Up Information.     Upon completion of this admission the patient was both mentally and medically stable for discharge denying suicidal/homicidal ideation, auditory/visual/tactile hallucinations, delusional thoughts and paranoia.    Jake [name] responded well to treatment with Depade 50 mg  And trazodone 50 mg without adverse effects. Pt demonstrated improvement without reported or observed adverse effects to the point of stability appropriate for outpatient management. Pertinent labs include: CMP, CBC  for which outpatient follow-up is necessary for lab recheck as mentioned below. Reviewed CBC, CMP, BAL + 323, and UDS; all unremarkable aside from noted exceptions.   Physical Findings: AIMS: Facial and Oral Movements Muscles of Facial Expression: None, normal Lips and Perioral Area: None, normal Jaw: None, normal Tongue: None, normal,Extremity Movements Upper (arms, wrists, hands, fingers): None, normal Lower (legs, knees, ankles, toes): None, normal, Trunk Movements Neck, shoulders, hips: None, normal, Overall Severity Severity of abnormal movements (highest score from questions above): None, normal Incapacitation due to abnormal movements: None, normal Patient's awareness of abnormal movements (rate only patient's report): No Awareness, Dental Status Current problems with teeth and/or dentures?: No Does patient  usually wear dentures?: No  CIWA:  CIWA-Ar Total: 2 COWS:     Musculoskeletal: Strength & Muscle Tone: within normal limits Gait & Station: normal Patient leans: N/A  Psychiatric Specialty Exam: See SRA by MD Physical Exam  Constitutional: He is oriented to person, place, and time. He appears well-developed.  Cardiovascular: Regular rhythm.   Neurological: He is alert and oriented to person, place, and time.  Psychiatric: He has a normal mood and affect.  His behavior is normal.    Review of Systems  Psychiatric/Behavioral: Negative for depression (stable) and suicidal ideas. The patient is not nervous/anxious.     Blood pressure 95/62, pulse 88, temperature 98 F (36.7 C), temperature source Oral, resp. rate 16, height 5\' 11"  (1.803 m), weight 59 kg (130 lb).Body mass index is 18.13 kg/m.   Have you used any form of tobacco in the last 30 days? (Cigarettes, Smokeless Tobacco, Cigars, and/or Pipes): Yes  Has this patient used any form of tobacco in the last 30 days? (Cigarettes, Smokeless Tobacco, Cigars, and/or Pipes) Yes, A prescription for an FDA-approved tobacco cessation medication was offered at discharge and the patient refused  Blood Alcohol level:  Lab Results  Component Value Date   ETH 323 (HH) 08/26/2016   ETH 221 (H) 08/18/2016    Metabolic Disorder Labs:  No results found for: HGBA1C, MPG No results found for: PROLACTIN No results found for: CHOL, TRIG, HDL, CHOLHDL, VLDL, LDLCALC  See Psychiatric Specialty Exam and Suicide Risk Assessment completed by Attending Physician prior to discharge.  Discharge destination:  Home  Is patient on multiple antipsychotic therapies at discharge:  No   Has Patient had three or more failed trials of antipsychotic monotherapy by history:  No  Recommended Plan for Multiple Antipsychotic Therapies: NA  Discharge Instructions    Diet - low sodium heart healthy    Complete by:  As directed    Discharge instructions    Complete by:  As directed    Take all medications as prescribed. Keep all follow-up appointments as scheduled.  Do not consume alcohol or use illegal drugs while on prescription medications. Report any adverse effects from your medications to your primary care provider promptly.  In the event of recurrent symptoms or worsening symptoms, call 911, a crisis hotline, or go to the nearest emergency department for evaluation.   Increase activity slowly    Complete by:  As  directed      Allergies as of 09/01/2016   No Known Allergies     Medication List    STOP taking these medications   azithromycin 250 MG tablet Commonly known as:  ZITHROMAX   chlordiazePOXIDE 25 MG capsule Commonly known as:  LIBRIUM   feeding supplement (ENSURE ENLIVE) Liqd   folic acid 1 MG tablet Commonly known as:  FOLVITE   multivitamin with minerals Tabs tablet   thiamine 100 MG tablet     TAKE these medications     Indication  hydrOXYzine 25 MG tablet Commonly known as:  ATARAX/VISTARIL Take 1 tablet (25 mg total) by mouth every 6 (six) hours as needed for anxiety.  Indication:  Feeling Anxious   naltrexone 50 MG tablet Commonly known as:  DEPADE Take 1 tablet (50 mg total) by mouth daily.  Indication:  Excessive Use of Alcohol   nicotine 21 mg/24hr patch Commonly known as:  NICODERM CQ - dosed in mg/24 hours Place 1 patch (21 mg total) onto the skin daily.  Indication:  Nicotine Addiction  traZODone 50 MG tablet Commonly known as:  DESYREL Take 1 tablet (50 mg total) by mouth at bedtime as needed for sleep. What changed:  medication strength  how much to take  when to take this  reasons to take this  additional instructions  Indication:  Trouble Sleeping      Follow-up Information    [facility] Follow up.   Specialty:  Behavioral Health Why:  Please go for a walk-in appointment to be established for intensive outpatient services. Walk-in hours are Mon, Wed, Fri 12p-3pm. Please arrive as early as possible to be sure that you are seen. Thank you. Contact information: [address] [place] [address] [phone]        [facility] Follow up.   Why:  Social worker has made a referral on your behalf for residential treatment. Awaiting bed avilability.  Contact information: [address] [place], [place] [address] P: [phone] F: [phone]          Follow-up recommendations:  Activity:   as tolerated Diet:  heart healthy  Comments:  Take all medications as prescribed. Keep all follow-up appointments as scheduled.  Do not consume alcohol or use illegal drugs while on prescription medications. Report any adverse effects from your medications to your primary care provider promptly.  In the event of recurrent symptoms or worsening symptoms, call 911, a crisis hotline, or go to the nearest emergency department for evaluation.   Signed: Oneta Rack, NP 09/01/2016, 9:43 AM

## 2016-09-01 NOTE — [facility] Suicide Risk Assessment (Signed)
[facility] Discharge Suicide Risk Assessment   Principal Problem: Alcohol withdrawal syndrome without complication [facility]) Discharge Diagnoses:  Patient Active Problem List   Diagnosis Date Noted  . MDD (major depressive disorder), recurrent severe, without psychosis (HCC) [F33.2] 08/27/2016  . Acute bronchitis [J20.9] 06/03/2016  . Homeless [Z59.0] 06/03/2016  . Alcohol withdrawal (HCC) [F10.239] 06/02/2016  . Tobacco abuse [Z72.0] 06/02/2016  . Protein-calorie malnutrition, severe (HCC) [E43] 07/10/2015  . Transaminitis [R74.0] 07/07/2015  . Hypokalemia [E87.6] 07/07/2015  . Alcohol withdrawal delirium, acute, mixed level of activity (HCC) [F10.231] 07/07/2015  . 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 Alcohol-induced mood disorder (HCC) [F10.94] 04/06/2015  . 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 Polysubstance abuse [F19.10] 03/09/2014  . Opioid dependence (HCC) [F11.20] 03/09/2014  . Alcohol use disorder, severe, dependence (HCC) [F10.20] 03/08/2014  . Substance induced mood disorder (HCC) [F19.94] 03/08/2014  . Alcohol abuse [F10.10] 06/28/2013  . Alcohol withdrawal syndrome without complication (HCC) [F10.230] 06/28/2013  . DTs (delirium tremens) (HCC) [F10.231] 06/28/2013  . Marijuana abuse [F12.10] 04/28/2011  . Alcoholism (HCC) [F10.20] 04/27/2011  . Toxic encephalopathy [G92] 04/27/2011  . Psychosis [F29] 04/27/2011  . Leukocytosis [D72.829] 04/27/2011  . Hyponatremia [E87.1] 04/27/2011  . Thrombocytopenia (HCC) [D69.6] 04/27/2011  . Elevated AST (SGOT) [R74.0] 04/27/2011  . Elevated bilirubin [R17] 04/27/2011    Total Time spent with patient: 45 minutes  Musculoskeletal: Strength & Muscle Tone: within normal limits Gait & Station: normal Patient leans: N/A  Psychiatric Specialty Exam: Review of Systems  Constitutional: Negative.   HENT: Negative.   Eyes: Negative.    Respiratory: Negative.   Cardiovascular: Negative.   Gastrointestinal: Negative.   Genitourinary: Negative.   Musculoskeletal: Negative.   Skin: Negative.   Neurological: Negative.   Endo/Heme/Allergies: Negative.   Psychiatric/Behavioral: Negative for depression, hallucinations, memory loss and suicidal ideas. The patient is not nervous/anxious and does not have insomnia.     Blood pressure 95/62, pulse 88, temperature 98 F (36.7 C), temperature source Oral, resp. rate 16, height 5\' 11"  (1.803 m), weight 59 kg (130 lb).Body mass index is 18.13 kg/m.  General Appearance:  In scrubs, not shaky, not sweaty. Pleasant, engaging well and cooperative. Appropriate behavior. Not in any distress. Good relatedness. Not internally stimulated  Eye Contact::  Good  Speech: Spontaneous, normal prosody. Normal tone and rate.   Volume:  Normal  Mood:  Euthymic  Affect:  Appropriate and Full Range  Thought Process:  Goal Directed and Linear  Orientation:  Full (Time, Place, and Person)  Thought Content:  No delusional theme. No preoccupation with violent thoughts. No negative ruminations. No obsession.  No hallucination in any modality.   Suicidal Thoughts:  No  Homicidal Thoughts:  No  Memory:  Immediate;   Good Recent;   Good Remote;   Good  Judgement:  Good  Insight:  Partial   Psychomotor Activity:  Normal  Concentration:  Good  Recall:  Good  Fund of Knowledge:Good  Language: Good  Akathisia:  Negative  Handed:    AIMS (if indicated):     Assets:  Communication Skills Desire for Improvement Physical Health Resilience  Sleep:  Number of Hours: 5.75  Cognition: WNL  ADL's:  Intact   Clinical Assessment::   46 yo Caucasian male, single, never married, no kids, homeless and unemployed. Self presented to the ER seeking help for alcohol use. Background history of AUD and associated mood disorder. Drinks daily and had his last  drink two hour prior to presentation. Had presented a couple  of days earlier seeking help. This time expressed thoughts of jumping in front of a train or off the bridge. BAL at presentation was 323 mg/dl. AST 418, ALT 312. UDS and other laboratory parameters were essentially normal.   Seen today. Has completely come off alcohol. No residual withdrawal symptoms. In good spirits. Plans to engage with AA.  Not feeling depressed. Reports normal anxiety about going into the shelter. No suicidal thoughts. No evidence of mania. No evidence of psychosis. No overwhelming anxiety. No craving for substances. No thoughts of violence. No homicidal thoughts. Patient reports normal sleep. He feels rested. He is eating well and energy has returned back to normal.   Nursing staff reports that patient has been appropriate on the unit. Patient has been interacting well with peers. No behavioral issues. Patient has not voiced any suicidal thoughts. Patient has not been observed to be internally stimulated. Patient has been adherent with treatment recommendations. Patient has been tolerating their medication well.   Patient was discussed at team. Team members feels that patient is back to his baseline level of function. Team agrees with plan to discharge patient today.  Demographic Factors:  Male, Caucasian, Low socioeconomic status and Unemployed  Loss Factors: NA  Historical Factors: Prior suicide attempts and Impulsivity  Risk Reduction Factors:   Positive social support, Positive therapeutic relationship and Positive coping skills or problem solving skills  Continued Clinical Symptoms:  As above   Cognitive Features That Contribute To Risk:  None    Suicide Risk:  Minimal: No identifiable suicidal ideation.  Patient is not having any thoughts of suicide at this time. Modifiable risk factors targeted during this admission includes substance use and depression. Demographical and historical risk factors cannot be modified. Patient is now engaging well. Patient is  reliable and is future oriented. We have buffered patient's support structures. At this point, patient is at low risk of suicide. Patient is aware of the effects of psychoactive substances on decision making process. Patient has been provided with emergency contacts. Patient acknowledges to use resources provided if unforseen circumstances changes their current risk stratification.   Follow-up Information    [facility] Follow up.   Specialty:  Behavioral Health Why:  Please go for a walk-in appointment to be established for intensive outpatient services. Walk-in hours are Mon, Wed, Fri 12p-3pm. Please arrive as early as possible to be sure that you are seen. Thank you. Contact information: [address] [place] [address] [phone]        [facility] Follow up.   Why:  Social worker has made a referral on your behalf for residential treatment. Please continue to call to check for bed availability. Thank you. Contact information: [address] [place], [place] [address] P: [phone] F: [phone]          Plan Of Care/Follow-up recommendations:  1. Continue current psychotropic medications 2. Mental health and addiction follow up as arranged.  3.  Provided limited quantity of prescriptions   Georgiann Cocker, MD 09/01/2016, 10:01 AM

## 2016-09-01 NOTE — Progress Notes (Signed)
Patient ID: Jake [name], male   DOB: 1970/03/03, 46 y.o.   MRN: [id number]  DAR: Pt. Denies SI/HI and A/V Hallucinations. He reports sleep is good, appetite is good, energy level is low, and concentration is poor. He rates depression, hopelessness, and anxiety 9/10 respectively. Patient does not report any pain at this time. He reports lightheadedness ans blurred vision on his daily inventory sheet this morning. His gait is steady and appropriate. He is able to fill out daily inventory sheet with no apparent difficulty. Support and encouragement provided to the patient. Scheduled medications administered to patient per physician's orders. Patient is minimal but cooperative. He is seen in the milieu and intermittently interacting with peers. Q15 minute checks are maintained for safety.

## 2016-09-26 ENCOUNTER — Emergency Department (HOSPITAL_COMMUNITY)
Admission: EM | Admit: 2016-09-26 | Discharge: 2016-09-27 | Disposition: A | Discharge status: Other Acute Inpatient Hospital | Payer: Self-pay | Attending: Emergency Medicine | Admitting: Emergency Medicine

## 2016-09-26 ENCOUNTER — Encounter (HOSPITAL_COMMUNITY): Payer: Self-pay

## 2016-09-26 DIAGNOSIS — F17210 Nicotine dependence, cigarettes, uncomplicated: Secondary | ICD-10-CM | POA: Insufficient documentation

## 2016-09-26 DIAGNOSIS — R45851 Suicidal ideations: Secondary | ICD-10-CM | POA: Insufficient documentation

## 2016-09-26 DIAGNOSIS — Z590 Homelessness: Secondary | ICD-10-CM | POA: Insufficient documentation

## 2016-09-26 DIAGNOSIS — Z046 Encounter for general psychiatric examination, requested by authority: Secondary | ICD-10-CM | POA: Insufficient documentation

## 2016-09-26 DIAGNOSIS — F10920 Alcohol use, unspecified with intoxication, uncomplicated: Secondary | ICD-10-CM | POA: Insufficient documentation

## 2016-09-26 DIAGNOSIS — F1010 Alcohol abuse, uncomplicated: Secondary | ICD-10-CM

## 2016-09-26 LAB — COMPREHENSIVE METABOLIC PANEL
ALBUMIN: 4.8 g/dL (ref 3.5–5.0)
ALT: 430 U/L — ABNORMAL HIGH (ref 17–63)
ANION GAP: 10 (ref 5–15)
AST: 342 U/L — AB (ref 15–41)
Alkaline Phosphatase: 146 U/L — ABNORMAL HIGH (ref 38–126)
BUN: <5 mg/dL — ABNORMAL LOW (ref 6–20)
CHLORIDE: 101 mmol/L (ref 101–111)
CO2: 29 mmol/L (ref 22–32)
Calcium: 9.3 mg/dL (ref 8.9–10.3)
Creatinine, Ser: 0.72 mg/dL (ref 0.61–1.24)
GFR calc Af Amer: >60 mL/min (ref >60)
GFR calc non Af Amer: >60 mL/min (ref >60)
GLUCOSE: 100 mg/dL — AB (ref 65–99)
POTASSIUM: 3.5 mmol/L (ref 3.5–5.1)
Sodium: 140 mmol/L (ref 135–145)
TOTAL PROTEIN: 8.6 g/dL — AB (ref 6.5–8.1)
Total Bilirubin: 0.5 mg/dL (ref 0.3–1.2)

## 2016-09-26 LAB — CBC WITH DIFFERENTIAL/PLATELET
BASOS ABS: 0.1 10*3/uL (ref 0.0–0.1)
BASOS PCT: 1 %
EOS PCT: 2 %
Eosinophils Absolute: 0.2 10*3/uL (ref 0.0–0.7)
HEMATOCRIT: 47.5 % (ref 39.0–52.0)
Hemoglobin: 16.8 g/dL (ref 13.0–17.0)
Lymphocytes Relative: 46 %
Lymphs Abs: 4.4 10*3/uL — ABNORMAL HIGH (ref 0.7–4.0)
MCH: 30.9 pg (ref 26.0–34.0)
MCHC: 35.4 g/dL (ref 30.0–36.0)
MCV: 87.3 fL (ref 78.0–100.0)
MONO ABS: 1.2 10*3/uL — AB (ref 0.1–1.0)
MONOS PCT: 13 %
NEUTROS ABS: 3.5 10*3/uL (ref 1.7–7.7)
Neutrophils Relative %: 38 %
PLATELETS: 301 10*3/uL (ref 150–400)
RBC: 5.44 MIL/uL (ref 4.22–5.81)
RDW: 13 % (ref 11.5–15.5)
WBC: 9.4 10*3/uL (ref 4.0–10.5)

## 2016-09-26 LAB — RAPID URINE DRUG SCREEN, HOSP PERFORMED
AMPHETAMINES: NOT DETECTED
BARBITURATES: NOT DETECTED
Benzodiazepines: NOT DETECTED
COCAINE: NOT DETECTED
OPIATES: POSITIVE — AB
TETRAHYDROCANNABINOL: NOT DETECTED

## 2016-09-26 LAB — ETHANOL: Alcohol, Ethyl (B): 303 mg/dL (ref <5)

## 2016-09-26 MED ORDER — HYDROXYZINE HCL 25 MG PO TABS
25.0000 mg | ORAL_TABLET | Freq: Four times a day (QID) | ORAL | Status: DC | PRN
Start: 2016-09-26 — End: 2016-09-27
  Administered 2016-09-27: 25 mg via ORAL
  Filled 2016-09-26: qty 1

## 2016-09-26 MED ORDER — NICOTINE 21 MG/24HR TD PT24
21.0000 mg | MEDICATED_PATCH | Freq: Every day | TRANSDERMAL | Status: DC
  Administered 2016-09-27: 21 mg via TRANSDERMAL
  Filled 2016-09-26: qty 1

## 2016-09-26 MED ORDER — NALTREXONE HCL 50 MG PO TABS
50.0000 mg | ORAL_TABLET | Freq: Every day | ORAL | Status: DC
  Administered 2016-09-27: 50 mg via ORAL
  Filled 2016-09-26: qty 1

## 2016-09-26 MED ORDER — TRAZODONE HCL 50 MG PO TABS: 50.0000 mg | ORAL_TABLET | Freq: Every evening | ORAL | Status: DC | PRN

## 2016-09-26 NOTE — ED Triage Notes (Addendum)
Pt arrives stating that he is SI with plans to "step in front of a train and end it all." Pt was here a month ago with the same complaint, became sober after that visit for about 22 days, and recently relapsed after losing his job. Pt states he OD'd on heroin yesterday. Pt drinks approx 4 40's/daily, and last drink was 2 hours ago. Denies chest pain, short of breath.

## 2016-09-26 NOTE — ED Notes (Signed)
TTS at bedside. 
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
## 2016-09-26 NOTE — ED Notes (Signed)
Pt belongings and pt wanded, belongings at nurses station

## 2016-09-26 NOTE — ED Notes (Signed)
Bed: [facility] Expected date:  Expected time:  Means of arrival:  Comments: 

## 2016-09-26 NOTE — ED Provider Notes (Signed)
[facility]-EMERGENCY DEPT Provider Note   CSN: [id number] Arrival date & time: 09/26/16  1944     History   Chief Complaint Chief Complaint  Patient presents with  . Suicidal    HPI Jake [name] is a 46 y.o. male.  He presents for evaluation of hopelessness, loss of job, and suicidal ideation.  He states he has resumed drinking after 23 days of being sober, and that he lost his job.  Because of this he feels suicidal and plans on walking front of a train.  He is also homeless.  He was recently hospitalized at the behavioral health center, for similar problems.  He denies recent illnesses including fever, chills, nausea or vomiting.  There are no other known modifying factors.  HPI  Past Medical History:  Diagnosis Date  . Anxiety   . Depression   . Elevated liver enzymes   . ETOH abuse   . Hip dislocation   . Seizures (HCC)    ETOH withdrawl    Patient Active Problem List   Diagnosis Date Noted  . MDD (major depressive disorder), recurrent severe, without psychosis (HCC) 08/27/2016  . Acute bronchitis 06/03/2016  . Homeless 06/03/2016  . Alcohol withdrawal (HCC) 06/02/2016  . Tobacco abuse 06/02/2016  . Protein-calorie malnutrition, severe (HCC) 07/10/2015  . Transaminitis 07/07/2015  . Hypokalemia 07/07/2015  . Alcohol withdrawal delirium, acute, mixed level of activity (HCC) 07/07/2015  . Severe recurrent major depression without psychotic features (HCC) 04/10/2015  . 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 Alcohol dependence with alcohol-induced mood disorder (HCC) 03/09/2014  . Polysubstance abuse 03/09/2014  . Opioid dependence (HCC) 03/09/2014  . Alcohol use disorder, severe, dependence (HCC) 03/08/2014  . Substance induced mood disorder (HCC) 03/08/2014  . Alcohol abuse 06/28/2013  . Alcohol withdrawal syndrome without complication (HCC) 06/28/2013  . DTs (delirium tremens) (HCC) 06/28/2013  . Marijuana abuse 04/28/2011  . Alcoholism (HCC)  04/27/2011  . Toxic encephalopathy 04/27/2011  . Psychosis 04/27/2011  . Leukocytosis 04/27/2011  . Hyponatremia 04/27/2011  . Thrombocytopenia (HCC) 04/27/2011  . Elevated AST (SGOT) 04/27/2011  . Elevated bilirubin 04/27/2011    History reviewed. No pertinent surgical history.     Home Medications    Prior to Admission medications   Medication Sig Start Date End Date Taking? Authorizing Provider  hydrOXYzine (ATARAX/VISTARIL) 25 MG tablet Take 1 tablet (25 mg total) by mouth every 6 (six) hours as needed for anxiety. Patient not taking: Reported on 09/26/2016 09/01/16   Oneta Rack, NP  naltrexone (DEPADE) 50 MG tablet Take 1 tablet (50 mg total) by mouth daily. Patient not taking: Reported on 09/26/2016 09/02/16   Oneta Rack, NP  nicotine (NICODERM CQ - DOSED IN MG/24 HOURS) 21 mg/24hr patch Place 1 patch (21 mg total) onto the skin daily. Patient not taking: Reported on 09/26/2016 09/02/16   Oneta Rack, NP  traZODone (DESYREL) 50 MG tablet Take 1 tablet (50 mg total) by mouth at bedtime as needed for sleep. Patient not taking: Reported on 09/26/2016 09/01/16   Oneta Rack, NP    Family History Family History  Problem Relation Age of Onset  . Heart attack Father     Social History Social History  Substance Use Topics  . Smoking status: Current Every Day Smoker    Packs/day: 2.00    Types: Cigarettes  . Smokeless tobacco: Never Used  . Alcohol use 4.8 oz/week    3 Cans of beer, 5 Shots of liquor per week  Comment: daily     Allergies   Patient has no known allergies.   Review of Systems Review of Systems  All other systems reviewed and are negative.    Physical Exam Updated Vital Signs BP 126/87   Pulse 77   Temp 98.2 F (36.8 C) (Oral)   Resp 16   Ht  (1.854 m)   Wt 61.2 kg (135 lb)   SpO2 99%   BMI 17.81 kg/m   Physical Exam  Constitutional: He is oriented to person, place, and time. He appears well-developed. No distress.    He is somewhat disheveled  HENT:  Head: Normocephalic and atraumatic.  Right Ear: External ear normal.  Left Ear: External ear normal.  Eyes: Pupils are equal, round, and reactive to light. Conjunctivae and EOM are normal.  Neck: Normal range of motion and phonation normal. Neck supple.  Cardiovascular: Normal rate.   Pulmonary/Chest: Effort normal. He exhibits no bony tenderness.  Musculoskeletal: Normal range of motion.  Neurological: He is alert and oriented to person, place, and time. No cranial nerve deficit or sensory deficit. He exhibits normal muscle tone. Coordination normal.  No dysarthria or aphasia  Skin: Skin is warm, dry and intact.  Psychiatric: He has a normal mood and affect. His behavior is normal. Judgment and thought content normal.  Nursing note and vitals reviewed.    ED Treatments / Results  Labs (all labs ordered are listed, but only abnormal results are displayed) Labs Reviewed  COMPREHENSIVE METABOLIC PANEL - Abnormal; Notable for the following:       Result Value   Glucose, Bld 100 (*)    BUN <5 (*)    Total Protein 8.6 (*)    AST 342 (*)    ALT 430 (*)    Alkaline Phosphatase 146 (*)    All other components within normal limits  ETHANOL - Abnormal; Notable for the following:    Alcohol, Ethyl (B) 303 (*)    All other components within normal limits  RAPID URINE DRUG SCREEN, HOSP PERFORMED - Abnormal; Notable for the following:    Opiates POSITIVE (*)    All other components within normal limits  CBC WITH DIFFERENTIAL/PLATELET - Abnormal; Notable for the following:    Lymphs Abs 4.4 (*)    Monocytes Absolute 1.2 (*)    All other components within normal limits    EKG  EKG Interpretation None       Radiology No results found.  Procedures Procedures (including critical care time)  Medications Ordered in ED Medications - No data to display   Initial Impression / Assessment and Plan / ED Course  I have reviewed the triage vital  signs and the nursing notes.  Pertinent labs & imaging results that were available during my care of the patient were reviewed by me and considered in my medical decision making (see chart for details).      Patient Vitals for the past 24 hrs:  BP Temp Temp src Pulse Resp SpO2 Height Weight  09/26/16 2028 126/87 - - 77 - - - -  09/26/16 2011 - - - - - -  (1.854 m) 61.2 kg (135 lb)  09/26/16 2010 126/87 98.2 F (36.8 C) Oral 77 16 99 % - -    TTS consult     Final Clinical Impressions(s) / ED Diagnoses   Final diagnoses:  Suicidal ideation  Alcohol abuse  Alcoholic intoxication without complication [facility])    Patient presents for evaluation of  suicidal ideation, with hopelessness and homelessness.  He has a plan to step in front of a train.  He will require evaluation by psychiatry, prior to discharge.  He is voluntary.  Nursing Notes Reviewed/ Care Coordinated Applicable Imaging Reviewed Interpretation of Laboratory Data incorporated into ED treatment  Plan-as per TTS in conjunction with oncoming provider team   New Prescriptions New Prescriptions   No medications on file     Mancel Bale, MD 09/26/16 2248

## 2016-09-27 ENCOUNTER — Inpatient Hospital Stay (HOSPITAL_COMMUNITY)
Admission: AD | Admit: 2016-09-27 | Discharge: 2016-10-02 | DRG: 885 | Disposition: A | Discharge status: Home / Self Care | Payer: Federal, State, Local not specified - Other | Source: Intra-hospital | Attending: Psychiatry | Admitting: Psychiatry

## 2016-09-27 DIAGNOSIS — F17210 Nicotine dependence, cigarettes, uncomplicated: Secondary | ICD-10-CM | POA: Diagnosis present

## 2016-09-27 DIAGNOSIS — F10930 Alcohol use, unspecified with withdrawal, uncomplicated: Secondary | ICD-10-CM | POA: Diagnosis present

## 2016-09-27 DIAGNOSIS — R569 Unspecified convulsions: Secondary | ICD-10-CM | POA: Diagnosis present

## 2016-09-27 DIAGNOSIS — Y908 Blood alcohol level of 240 mg/100 ml or more: Secondary | ICD-10-CM | POA: Diagnosis present

## 2016-09-27 DIAGNOSIS — I10 Essential (primary) hypertension: Secondary | ICD-10-CM

## 2016-09-27 DIAGNOSIS — Z915 Personal history of self-harm: Secondary | ICD-10-CM

## 2016-09-27 DIAGNOSIS — Z79899 Other long term (current) drug therapy: Secondary | ICD-10-CM

## 2016-09-27 DIAGNOSIS — Z560 Unemployment, unspecified: Secondary | ICD-10-CM | POA: Diagnosis not present

## 2016-09-27 DIAGNOSIS — F10230 Alcohol dependence with withdrawal, uncomplicated: Secondary | ICD-10-CM | POA: Diagnosis present

## 2016-09-27 DIAGNOSIS — F322 Major depressive disorder, single episode, severe without psychotic features: Principal | ICD-10-CM | POA: Diagnosis present

## 2016-09-27 DIAGNOSIS — F419 Anxiety disorder, unspecified: Secondary | ICD-10-CM | POA: Diagnosis present

## 2016-09-27 DIAGNOSIS — F332 Major depressive disorder, recurrent severe without psychotic features: Secondary | ICD-10-CM

## 2016-09-27 DIAGNOSIS — F1020 Alcohol dependence, uncomplicated: Secondary | ICD-10-CM | POA: Diagnosis present

## 2016-09-27 DIAGNOSIS — Z590 Homelessness: Secondary | ICD-10-CM | POA: Diagnosis not present

## 2016-09-27 DIAGNOSIS — F1994 Other psychoactive substance use, unspecified with psychoactive substance-induced mood disorder: Secondary | ICD-10-CM | POA: Diagnosis present

## 2016-09-27 MED ORDER — CHLORDIAZEPOXIDE HCL 25 MG PO CAPS: 25.0000 mg | ORAL_CAPSULE | ORAL | Status: DC | PRN

## 2016-09-27 MED ORDER — TERBINAFINE HCL 1 % EX CREA
TOPICAL_CREAM | Freq: Two times a day (BID) | CUTANEOUS | Status: DC
  Administered 2016-09-28 – 2016-10-02 (×8): via TOPICAL
  Filled 2016-09-27 (×2): qty 12

## 2016-09-27 MED ORDER — LORAZEPAM 1 MG PO TABS
2.0000 mg | ORAL_TABLET | Freq: Three times a day (TID) | ORAL | Status: AC
  Administered 2016-09-27 – 2016-09-29 (×6): 2 mg via ORAL
  Filled 2016-09-27 (×6): qty 2

## 2016-09-27 MED ORDER — CHLORDIAZEPOXIDE HCL 25 MG PO CAPS: 25.0000 mg | ORAL_CAPSULE | Freq: Three times a day (TID) | ORAL | Status: DC

## 2016-09-27 MED ORDER — GABAPENTIN 600 MG PO TABS
300.0000 mg | ORAL_TABLET | Freq: Three times a day (TID) | ORAL | Status: DC
  Administered 2016-09-27: 300 mg via ORAL
  Filled 2016-09-27: qty 1
  Filled 2016-09-27 (×2): qty 0.5

## 2016-09-27 MED ORDER — FOLIC ACID 1 MG PO TABS
1.0000 mg | ORAL_TABLET | Freq: Every day | ORAL | Status: DC
  Administered 2016-09-27 – 2016-10-02 (×6): 1 mg via ORAL
  Filled 2016-09-27 (×9): qty 1

## 2016-09-27 MED ORDER — NALTREXONE HCL 50 MG PO TABS
50.0000 mg | ORAL_TABLET | Freq: Every day | ORAL | Status: DC
  Administered 2016-09-27 – 2016-10-02 (×6): 50 mg via ORAL
  Filled 2016-09-27: qty 7
  Filled 2016-09-27 (×7): qty 1
  Filled 2016-09-27: qty 7
  Filled 2016-09-27: qty 1

## 2016-09-27 MED ORDER — VITAMIN B-1 100 MG PO TABS
100.0000 mg | ORAL_TABLET | Freq: Every day | ORAL | Status: AC
  Administered 2016-09-27 – 2016-09-29 (×3): 100 mg via ORAL
  Filled 2016-09-27 (×4): qty 1

## 2016-09-27 MED ORDER — LORAZEPAM 1 MG PO TABS
2.0000 mg | ORAL_TABLET | Freq: Four times a day (QID) | ORAL | Status: DC | PRN
  Administered 2016-09-27 – 2016-10-01 (×5): 2 mg via ORAL
  Filled 2016-09-27 (×6): qty 2

## 2016-09-27 MED ORDER — MAGNESIUM HYDROXIDE 400 MG/5ML PO SUSP: 30.0000 mL | Freq: Every day | ORAL | Status: DC | PRN

## 2016-09-27 MED ORDER — ACETAMINOPHEN 325 MG PO TABS: 650.0000 mg | ORAL_TABLET | Freq: Four times a day (QID) | ORAL | Status: DC | PRN

## 2016-09-27 MED ORDER — TRAZODONE HCL 50 MG PO TABS
50.0000 mg | ORAL_TABLET | Freq: Every day | ORAL | Status: DC
  Administered 2016-09-27 – 2016-10-01 (×5): 50 mg via ORAL
  Filled 2016-09-27 (×5): qty 1
  Filled 2016-09-27 (×2): qty 7
  Filled 2016-09-27 (×3): qty 1

## 2016-09-27 MED ORDER — ALUM & MAG HYDROXIDE-SIMETH 200-200-20 MG/5ML PO SUSP: 30.0000 mL | ORAL | Status: DC | PRN

## 2016-09-27 NOTE — Progress Notes (Signed)
Adult Psychoeducational Group Note  Date:  09/27/2016 Time:  9:48 PM  Group Topic/Focus:  Wrap-Up Group:   The focus of this group is to help patients review their daily goal of treatment and discuss progress on daily workbooks.  Participation Level:  Did Not Attend  Participation Quality:  Did not attend  Affect:  Did not attend  Cognitive:  Did not attend  Insight: None  Engagement in Group:  Did not attend  Modes of Intervention:  Did not attend  Additional Comments:  Pt did not attend group.  Felipa Furnace 09/27/2016, 9:48 PM

## 2016-09-27 NOTE — ED Provider Notes (Signed)
Medically stable for transfer.  Vitals:   09/27/16 0138 09/27/16 0320  BP: 117/79 117/79  Pulse: 73 80  Resp:  16  Temp:    SpO2:  92%      Linwood Dibbles, MD 09/27/16 [phone]
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
## 2016-09-27 NOTE — Progress Notes (Signed)
D.  Pt pleasant on approach, new admission tonight.  Pt did not feel well enough to attend AA group this first night.  Pt denies SI/HI/AVH at this time.  Pt experiencing moderate withdrawal symptoms at present, has history of seizures and black outs with withdrawal.  A.  Support and encouragement offered, medication given as ordered for withdrawal.  R.  Pt remains safe on the unit, will continue to monitor.

## 2016-09-27 NOTE — BH Assessment (Signed)
[facility] Assessment Progress Note   [facility] Attending will be Dr. Jama Flavors, MD. Call to report [phone]. Bed is ready now. Huntley Dec, RN notified.
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
## 2016-09-27 NOTE — [facility] Group Notes (Signed)
[facility] LCSW Group Therapy Note  09/27/2016 at  9:10 to 10:00 AM  Type of Therapy and Topic:  Group Therapy: Avoiding Self-Sabotaging and Enabling Behaviors  Participation Level:  Did Not Attend; invited to participate yet did not despite overhead announcement and encouragement by staff     Aydia Maj C Loriene Taun[place], LCSW 

## 2016-09-27 NOTE — ED Notes (Signed)
Called report to [facility]. [facility] states they will take the patient in the morning due to the alcohol level being 303 at 2115.

## 2016-09-27 NOTE — Plan of Care (Signed)
Problem: Activity: Goal: Interest or engagement in activities will improve Outcome: Not Progressing Patient admitted earlier in the day, has been reclusive to his room all day, reported not feeling too well to participate in activities.  Problem: Safety: Goal: Periods of time without injury will increase Outcome: Progressing Patient currently on Q15 minute safety checks, will continue to monitor.  Problem: Education: Goal: Knowledge of disease or condition will improve Outcome: Progressing Patient reports being knowledgeable about the dangers of his substance use.  Comments: Patient arrived unit under voluntary status, endorses +SI, reported a plan to jump in front of a train prior to arrival, now denies having a plan.  Pt denies AVH/HI, verbally CFS in the hospital, reports that he will let staff know if suicidal thoughts worsen.    Patient observed to have a large macular indurated rash, covering his entire sacral area, and reports that he has had this rash "for many months".  Pt reports being homeless, states that this, in addition to his polysubstance abuse are his stressors.  Pt reports that he drinks at least 1/5th of Liquor every day, along with occasional cocaine and marijuana use.  Pt reports chills, restlessness, and anxiety on arrival to the unit, and medicated with Ativan  along with other scheduled meds this evening.  V/S are WNL, Q15 minute safety checks in place, pt oriented to care setting, verbalizes understanding, will report to oncoming shift.

## 2016-09-27 NOTE — ED Notes (Signed)
Pt sleeping quietly with even, unlabored respirations

## 2016-09-28 ENCOUNTER — Encounter (HOSPITAL_COMMUNITY): Payer: Self-pay | Admitting: *Deleted

## 2016-09-28 LAB — TSH: TSH: 1.275 u[IU]/mL (ref 0.350–4.500)

## 2016-09-28 LAB — LIPID PANEL
CHOLESTEROL: 198 mg/dL (ref 0–200)
HDL: 75 mg/dL (ref >40)
LDL Cholesterol: 94 mg/dL (ref 0–99)
TRIGLYCERIDES: 146 mg/dL (ref <150)
Total CHOL/HDL Ratio: 2.6 RATIO
VLDL: 29 mg/dL (ref 0–40)

## 2016-09-28 MED ORDER — NICOTINE 21 MG/24HR TD PT24
21.0000 mg | MEDICATED_PATCH | Freq: Every day | TRANSDERMAL | Status: DC
  Administered 2016-09-28 – 2016-10-02 (×5): 21 mg via TRANSDERMAL
  Filled 2016-09-28 (×7): qty 1

## 2016-09-28 MED ORDER — GABAPENTIN 300 MG PO CAPS
300.0000 mg | ORAL_CAPSULE | Freq: Three times a day (TID) | ORAL | Status: DC
  Administered 2016-09-28 – 2016-10-02 (×14): 300 mg via ORAL
  Filled 2016-09-28: qty 1
  Filled 2016-09-28 (×2): qty 21
  Filled 2016-09-28 (×2): qty 1
  Filled 2016-09-28: qty 21
  Filled 2016-09-28 (×2): qty 1
  Filled 2016-09-28: qty 21
  Filled 2016-09-28 (×4): qty 1
  Filled 2016-09-28: qty 21
  Filled 2016-09-28 (×6): qty 1
  Filled 2016-09-28: qty 21
  Filled 2016-09-28: qty 1

## 2016-09-28 NOTE — [facility] Group Notes (Signed)
[facility] LCSW Group Therapy Note  09/28/2016  10:15 to 11 AM   Type of Therapy and Topic: Group Therapy: Feelings Around Returning Home & Establishing a Supportive Framework   Participation Level: Did Not attend; invited to participate yet did not despite overhead announcement and encouragement by staff     Abbie Jablon C Holdyn Poyser, LCSW     

## 2016-09-28 NOTE — Progress Notes (Signed)
[facility] MD Progress Note  09/28/2016 1:02 PM Jake [name]  MRN:  [id number] Subjective:   46 yo Caucasian male, single, never married, no kids, homeless and unemployed. Background history of AUD. Recently discharged from our unit. Patient reports relapse after twenty two days of sobriety. Says he attempted to overdose with heroin the day before. He has been having thoughts of walking in front of a train.  BAL is 303 mg/dl. UDS is positive for heroin. Marked liver enzyme derangement. Other labs are essentially normal.  Chart reviewed today. Patient discussed at team today.  Staff reports that he has not been able to participate at groups. He is pleasant when approached. He has not been observed to be internally stimulated. He has not voiced any suicidal thoughts. He has been cooperative with care.   Seen today. In bed. Says he is marginally better today. Still sees dots and sparks of light. No auditory or tactile hallucination.  He has not been throwing up. Shakes are less. Says he has been in bed most of the time. No suicidal thoughts. Knows he would get better gradually. Encouraged.  Principal Problem: Alcohol withdrawal syndrome without complication (HCC) Diagnosis:   Patient Active Problem List   Diagnosis Date Noted  . Severe major depression (HCC) [F32.2] 09/27/2016  . MDD (major depressive disorder), recurrent severe, without psychosis (HCC) [F33.2] 08/27/2016  . Acute bronchitis [J20.9] 06/03/2016  . Homeless [Z59.0] 06/03/2016  . Alcohol withdrawal (HCC) [F10.239] 06/02/2016  . Tobacco abuse [Z72.0] 06/02/2016  . Protein-calorie malnutrition, severe (HCC) [E43] 07/10/2015  . Transaminitis [R74.0] 07/07/2015  . Hypokalemia [E87.6] 07/07/2015  . Alcohol withdrawal delirium, acute, mixed level of activity (HCC) [F10.231] 07/07/2015  . 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 Alcohol-induced mood disorder (HCC) [F10.94] 04/06/2015  .  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 Polysubstance abuse [F19.10] 03/09/2014  . Opioid dependence (HCC) [F11.20] 03/09/2014  . Alcohol use disorder, severe, dependence (HCC) [F10.20] 03/08/2014  . Substance induced mood disorder (HCC) [F19.94] 03/08/2014  . Alcohol abuse [F10.10] 06/28/2013  . Alcohol withdrawal syndrome without complication (HCC) [F10.230] 06/28/2013  . DTs (delirium tremens) (HCC) [F10.231] 06/28/2013  . Marijuana abuse [F12.10] 04/28/2011  . Alcoholism (HCC) [F10.20] 04/27/2011  . Toxic encephalopathy [G92] 04/27/2011  . Psychosis [F29] 04/27/2011  . Leukocytosis [D72.829] 04/27/2011  . Hyponatremia [E87.1] 04/27/2011  . Thrombocytopenia (HCC) [D69.6] 04/27/2011  . Elevated AST (SGOT) [R74.0] 04/27/2011  . Elevated bilirubin [R17] 04/27/2011   Total Time spent with patient: 20 minutes  Past Psychiatric History: As in H&P  Past Medical History:  Past Medical History:  Diagnosis Date  . Anxiety   . Depression   . Elevated liver enzymes   . ETOH abuse   . Hip dislocation   . Seizures (HCC)    ETOH withdrawl   No past surgical history on file. Family History:  Family History  Problem Relation Age of Onset  . Heart attack Father    Family Psychiatric  History: As in H&P Social History:  History  Alcohol Use  . 4.8 oz/week  . 3 Cans of beer, 5 Shots of liquor per week    Comment: daily     History  Drug Use No    Comment: recend heroine OD on 09/25/16 per pt    Social History   Social History  . Marital status: Single    Spouse name: N/A  . Number of children: N/A  . Years  of education: N/A   Social History Main Topics  . Smoking status: Current Every Day Smoker    Packs/day: 2.00    Types: Cigarettes  . Smokeless tobacco: Never Used  . Alcohol use 4.8 oz/week    3 Cans of beer, 5 Shots of liquor per week     Comment: daily  . Drug use: No     Comment: recend heroine OD on 09/25/16 per pt  .  Sexual activity: Yes   Other Topics Concern  . Not on file   Social History Narrative  . No narrative on file   Additional Social History:      Sleep: Good  Appetite:  Good  Current Medications: Current Facility-Administered Medications  Medication Dose Route Frequency Provider Last Rate Last Dose  . acetaminophen (TYLENOL) tablet 650 mg  650 mg Oral Q6H PRN Nira Conn A, NP      . alum & mag hydroxide-simeth (MAALOX/MYLANTA) 200-200-20 MG/5ML suspension 30 mL  30 mL Oral Q4H PRN Nira Conn A, NP      . folic acid (FOLVITE) tablet 1 mg  1 mg Oral Daily Izediuno, Delight Ovens, MD   1 mg at 09/28/16 0841  . gabapentin (NEURONTIN) capsule 300 mg  300 mg Oral TID Cobos, Rockey Situ, MD   300 mg at 09/28/16 1211  . LORazepam (ATIVAN) tablet 2 mg  2 mg Oral Q6H PRN Georgiann Cocker, MD   2 mg at 09/27/16 2121  . LORazepam (ATIVAN) tablet 2 mg  2 mg Oral TID Georgiann Cocker, MD   2 mg at 09/28/16 1212  . magnesium hydroxide (MILK OF MAGNESIA) suspension 30 mL  30 mL Oral Daily PRN Nira Conn A, NP      . naltrexone (DEPADE) tablet 50 mg  50 mg Oral Daily Izediuno, Delight Ovens, MD   50 mg at 09/28/16 0841  . nicotine (NICODERM CQ - dosed in mg/24 hours) patch 21 mg  21 mg Transdermal Daily Cobos, Rockey Situ, MD   21 mg at 09/28/16 0842  . terbinafine (LAMISIL) 1 % cream   Topical BID Nira Conn A, NP      . thiamine (VITAMIN B-1) tablet 100 mg  100 mg Oral Daily Izediuno, Delight Ovens, MD   100 mg at 09/28/16 0841  . traZODone (DESYREL) tablet 50 mg  50 mg Oral QHS Izediuno, Delight Ovens, MD   50 mg at 09/27/16 2121    Lab Results:  Results for orders placed or performed during the hospital encounter of 09/27/16 (from the past 48 hour(s))  Lipid panel     Status: None   Collection Time: 09/28/16  6:30 AM  Result Value Ref Range   Cholesterol 198 0 - 200 mg/dL   Triglycerides 086 <578 mg/dL   HDL 75 >46 mg/dL   Total CHOL/HDL Ratio 2.6 RATIO   VLDL 29 0 - 40 mg/dL   LDL  Cholesterol 94 0 - 99 mg/dL    Comment:        Total Cholesterol/HDL:CHD Risk Coronary Heart Disease Risk Table                     Men   Women  1/2 Average Risk   3.4   3.3  Average Risk       5.0   4.4  2 X Average Risk   9.6   7.1  3 X Average Risk  23.4   11.0  Use the calculated Patient Ratio above and the CHD Risk Table to determine the patient's CHD Risk.        ATP III CLASSIFICATION (LDL):  <100     mg/dL   Optimal  469-629  mg/dL   Near or Above                    Optimal  130-159  mg/dL   Borderline  528-413  mg/dL   High  >244     mg/dL   Very High Performed at [facility], [address], [place], [place] [address]   TSH     Status: None   Collection Time: 09/28/16  6:30 AM  Result Value Ref Range   TSH 1.275 0.350 - 4.500 uIU/mL    Comment: Performed by a 3rd Generation assay with a functional sensitivity of <=0.01 uIU/mL. Performed at [facility], [address], [place], [place] [address]     Blood Alcohol level:  Lab Results  Component Value Date   ETH 303 [facility]) 09/26/2016   ETH 323 ([facility]) 08/26/2016    Metabolic Disorder Labs: No results found for: HGBA1C, MPG No results found for: PROLACTIN Lab Results  Component Value Date   CHOL 198 09/28/2016   TRIG 146 09/28/2016   HDL 75 09/28/2016   CHOLHDL 2.6 09/28/2016   VLDL 29 09/28/2016   LDLCALC 94 09/28/2016    Physical Findings: AIMS:  , ,  ,  ,    CIWA:  CIWA-Ar Total: 5 COWS:     Musculoskeletal: Strength & Muscle Tone: within normal limits Gait & Station: broad based Patient leans: N/A  Psychiatric Specialty Exam: Physical Exam  Constitutional: He is oriented to person, place, and time. No distress.  HENT:  Head: Normocephalic and atraumatic.  Respiratory: Effort normal.  Neurological: He is alert and oriented to person, place, and time.  Skin: He is not diaphoretic.  Psychiatric:  As above    ROS  Blood pressure 118/66, pulse 69, temperature  97.9 F (36.6 C), temperature source Oral, resp. rate 16.There is no height or weight on file to calculate BMI.  General Appearance: In bed with the lights off. Has been under the sheets most of the day. Limited engagement.   Eye Contact:  Minimal  Speech:  Not loud or pressured  Volume:  Normal  Mood:  Dysphoric   Affect:  Mood congruent  Thought Process:  Linear  Orientation:  Full (Time, Place, and Person)  Thought Content:  No violent thoughts. visual hallucination.   Suicidal Thoughts:  No  Homicidal Thoughts:  No  Memory:  Did not assess today  Judgement: Fair  Insight:  Fair  Psychomotor Activity:  Decreased   Concentration:  Fair   Recall:  Did not assess today   Fund of Knowledge:  Fair  Language:  Good  Akathisia:  Negative  Handed:    AIMS (if indicated):     Assets:  Desire for Improvement Resilience  ADL's:  Impaired  Cognition:  WNL  Sleep:  Number of Hours: 6.25     Assessment and Plan   Patient is still in withdrawals. He is still on tapering dose of Ativan. Needs further evaluation.    Psychiatric: AUD SIMD  Medical: HTN  Psychosocial:  Homelessness Unemployment No support  PLAN: 1. Continue medications at current dose 2. Continue to monitor mood, behavior and interactions  Georgiann Cocker, MD 09/28/2016, 1:02 PM

## 2016-09-28 NOTE — Tx Team (Signed)
Initial Treatment Plan 09/28/2016 1:13 AM Desmond Lope ZHY:[id number]    PATIENT STRESSORS: Financial difficulties Substance abuse   PATIENT STRENGTHS: Average or above average intelligence Capable of independent living   PATIENT IDENTIFIED PROBLEMS: Substance abuse  Suicidal Ideation  Depression  "get sober"               DISCHARGE CRITERIA:  Adequate post-discharge living arrangements Improved stabilization in mood, thinking, and/or behavior Motivation to continue treatment in a less acute level of care Need for constant or close observation no longer present Verbal commitment to aftercare and medication compliance Withdrawal symptoms are absent or subacute and managed without 24-hour nursing intervention  PRELIMINARY DISCHARGE PLAN: Attend 12-step recovery group Placement in alternative living arrangements  PATIENT/FAMILY INVOLVEMENT: This treatment plan has been presented to and reviewed with the patient, Jake [name].  The patient and family have been given the opportunity to ask questions and make suggestions.  Juliann Pares, RN 09/28/2016, 1:13 AM

## 2016-09-28 NOTE — [facility] Group Notes (Signed)
[facility] Group Notes:  (Nursing/MHT/Case Management/Adjunct)  Date:  09/28/2016  Time:  1330   Type of Therapy:  Nurse Education - Meditation Group  Participation Level:  Did Not Attend  Participation Quality:    Affect:    Cognitive:    Insight:    Engagement in Group:    Modes of Intervention:    Summary of Progress/Problems: Patient invited however elected not to attend  Lawrence Marseilles 09/28/2016, 3:04 PM

## 2016-09-28 NOTE — Plan of Care (Signed)
Problem: Education: Goal: Verbalization of understanding the information provided will improve Outcome: Progressing Patient verbalizes understanding of information, education provided thus far.  Problem: Medication: Goal: Compliance with prescribed medication regimen will improve Outcome: Completed/Met Date Met: 09/28/16 Patient is med compliant and verbalizes intent to remain so both during admit but also upon discharge.

## 2016-09-28 NOTE — [facility] Suicide Risk Assessment (Signed)
[facility] Admission Suicide Risk Assessment   Nursing information obtained from:    Demographic factors:    Current Mental Status:    Loss Factors:    Historical Factors:    Risk Reduction Factors:     Total Time spent with patient: 45 minutes Principal Problem: Alcohol withdrawal syndrome without complication (HCC) Diagnosis:   Patient Active Problem List   Diagnosis Date Noted  . Severe major depression (HCC) [F32.2] 09/27/2016  . MDD (major depressive disorder), recurrent severe, without psychosis (HCC) [F33.2] 08/27/2016  . Acute bronchitis [J20.9] 06/03/2016  . Homeless [Z59.0] 06/03/2016  . Alcohol withdrawal (HCC) [F10.239] 06/02/2016  . Tobacco abuse [Z72.0] 06/02/2016  . Protein-calorie malnutrition, severe (HCC) [E43] 07/10/2015  . Transaminitis [R74.0] 07/07/2015  . Hypokalemia [E87.6] 07/07/2015  . Alcohol withdrawal delirium, acute, mixed level of activity (HCC) [F10.231] 07/07/2015  . 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 Alcohol-induced mood disorder (HCC) [F10.94] 04/06/2015  . 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 Polysubstance abuse [F19.10] 03/09/2014  . Opioid dependence (HCC) [F11.20] 03/09/2014  . Alcohol use disorder, severe, dependence (HCC) [F10.20] 03/08/2014  . Substance induced mood disorder (HCC) [F19.94] 03/08/2014  . Alcohol abuse [F10.10] 06/28/2013  . Alcohol withdrawal syndrome without complication (HCC) [F10.230] 06/28/2013  . DTs (delirium tremens) (HCC) [F10.231] 06/28/2013  . Marijuana abuse [F12.10] 04/28/2011  . Alcoholism (HCC) [F10.20] 04/27/2011  . Toxic encephalopathy [G92] 04/27/2011  . Psychosis [F29] 04/27/2011  . Leukocytosis [D72.829] 04/27/2011  . Hyponatremia [E87.1] 04/27/2011  . Thrombocytopenia (HCC) [D69.6] 04/27/2011  . Elevated AST (SGOT) [R74.0] 04/27/2011  . Elevated bilirubin [R17] 04/27/2011   Subjective Data:  46 yo  Caucasian male, single, never married, no kids, homeless and unemployed. Background history of AUD. Recently discharged from our unit. Patient reports relapse after twenty two days of sobriety. Says he attempted to overdose with heroin the day before. He has been having thoughts of walking in front of a train.  BAL is 303 mg/dl. UDS is positive for heroin. Marked liver enzyme derangement. Other labs are essentially normal.  Continued Clinical Symptoms:  Alcohol Use Disorder Identification Test Final Score (AUDIT): 37 The "Alcohol Use Disorders Identification Test", Guidelines for Use in Primary Care, Second Edition.  World Science writer [facility]). Score between 0-7:  no or low risk or alcohol related problems. Score between 8-15:  moderate risk of alcohol related problems. Score between 16-19:  high risk of alcohol related problems. Score 20 or above:  warrants further diagnostic evaluation for alcohol dependence and treatment.   CLINICAL FACTORS:   Alcohol/Substance Abuse/Dependencies   Musculoskeletal: Strength & Muscle Tone: within normal limits Gait & Station: broad based Patient leans: N/A  Psychiatric Specialty Exam: Physical Exam   ROS   Blood pressure 118/66, pulse 69, temperature 97.9 F (36.6 C), temperature source Oral, resp. rate 16.There is no height or weight on file to calculate BMI.  General Appearance: As in H&P  Eye Contact:  As in H&P  Speech:  As in H&P  Volume:  As in H&P  Mood:  As in H&P  Affect:  As in H&P  Thought Process:  As in H&P  Orientation:  As in H&P  Thought Content:  As in H&P  Suicidal Thoughts:  As in H&P  Homicidal Thoughts:  As in H&P  Memory:  As in H&P  Judgement:  As in H&P  Insight:  As in H&P  Psychomotor Activity:  As in H&P  Concentration:  As in H&P  Recall:  As in H&P  Fund of Knowledge:  As in H&P  Language:  As in H&P  Akathisia:  As in H&P  Handed:  As in H&P  AIMS (if indicated):     Assets:  As in H&P  ADL's:  As  in H&P  Cognition:  As in H&P  Sleep:  Number of Hours: 6.25      COGNITIVE FEATURES THAT CONTRIBUTE TO RISK:  None    SUICIDE RISK:   Minimal: No identifiable suicidal ideation.  Patients presenting with no risk factors but with morbid ruminations; may be classified as minimal risk based on the severity of the depressive symptoms  PLAN OF CARE:  As in H&P  I certify that inpatient services furnished can reasonably be expected to improve the patient's condition.   Georgiann Cocker, MD 09/28/2016, 1:01 PM

## 2016-09-28 NOTE — Progress Notes (Signed)
D: Patient observed isolative to room most of the day. Remains in scrubs, disheveled. Frequent contacts made 1:1 throughout shift. Patient verbalizes to this Clinical research associate he is experiencing withdrawal - noted to be tremulous, anxious and reports mild skin crawling and sweats. CIWA at noon was a "5", VSS. Patient's affect anxious with congruent mood. Per self inventory and discussions with writer, rates depression, hopelessness and anxiety all at a 9/10. Rates sleep as good, appetite as poor, energy as low and concentration as poor.  States goal for today is "staying sober" and "pray."  A: Medicated per orders, no prns requested or required. Level III obs in place for safety. Emotional support offered and self inventory reviewed. Encouraged completion of Suicide Safety Plan and programming participation. Discussed POC with MD, SW.  Fall prevention plan in place and reviewed with patient as pt is a high fall risk due to hx of frequent falls.   R: Patient verbalizes understanding of POC, falls prevention education.  Patient endorsing passive SI but verbally contracts for safety. No HI/AVH and remains safe on level III obs. Will continue to monitor closely and make verbal contact frequently.

## 2016-09-29 LAB — HEMOGLOBIN A1C
Hgb A1c MFr Bld: 5.7 % — ABNORMAL HIGH (ref 4.8–5.6)
Mean Plasma Glucose: 116.89 mg/dL

## 2016-09-29 MED ORDER — ADULT MULTIVITAMIN W/MINERALS CH
1.0000 | ORAL_TABLET | Freq: Every day | ORAL | Status: DC
  Administered 2016-09-29 – 2016-10-02 (×4): 1 via ORAL
  Filled 2016-09-29 (×7): qty 1

## 2016-09-29 MED ORDER — ENSURE ENLIVE PO LIQD
237.0000 mL | Freq: Three times a day (TID) | ORAL | Status: DC
  Administered 2016-09-29 – 2016-10-02 (×9): 237 mL via ORAL

## 2016-09-29 NOTE — Tx Team (Signed)
Interdisciplinary Treatment and Diagnostic Plan Update 09/29/2016 Time of Session: 9:30am  Jake [name]  MRN: [id number]  Principal Diagnosis: Alcohol withdrawal syndrome without complication [facility])  Secondary Diagnoses: Principal Problem:   Alcohol withdrawal syndrome without complication (HCC) Active Problems:   Alcoholism (HCC)   Substance induced mood disorder (HCC)   Severe major depression (HCC)   Current Medications:  Current Facility-Administered Medications  Medication Dose Route Frequency Provider Last Rate Last Dose  . acetaminophen (TYLENOL) tablet 650 mg  650 mg Oral Q6H PRN Jackelyn Poling, NP      . alum & mag hydroxide-simeth (MAALOX/MYLANTA) 200-200-20 MG/5ML suspension 30 mL  30 mL Oral Q4H PRN Nira Conn A, NP      . feeding supplement (ENSURE ENLIVE) (ENSURE ENLIVE) liquid 237 mL  237 mL Oral TID BM Cobos, Rockey Situ, MD      . folic acid (FOLVITE) tablet 1 mg  1 mg Oral Daily Izediuno, Delight Ovens, MD   1 mg at 09/29/16 0841  . gabapentin (NEURONTIN) capsule 300 mg  300 mg Oral TID Cobos, Rockey Situ, MD   300 mg at 09/29/16 0842  . LORazepam (ATIVAN) tablet 2 mg  2 mg Oral Q6H PRN Georgiann Cocker, MD   2 mg at 09/28/16 2146  . LORazepam (ATIVAN) tablet 2 mg  2 mg Oral TID Georgiann Cocker, MD   2 mg at 09/29/16 0842  . magnesium hydroxide (MILK OF MAGNESIA) suspension 30 mL  30 mL Oral Daily PRN Nira Conn A, NP      . multivitamin with minerals tablet 1 tablet  1 tablet Oral Daily Cobos, Fernando A, MD      . naltrexone (DEPADE) tablet 50 mg  50 mg Oral Daily Izediuno, Delight Ovens, MD   50 mg at 09/29/16 0842  . nicotine (NICODERM CQ - dosed in mg/24 hours) patch 21 mg  21 mg Transdermal Daily Cobos, Rockey Situ, MD   21 mg at 09/29/16 0845  . terbinafine (LAMISIL) 1 % cream   Topical BID Nira Conn A, NP      . traZODone (DESYREL) tablet 50 mg  50 mg Oral QHS Izediuno, Delight Ovens, MD   50 mg at 09/28/16 2146    PTA Medications: Prescriptions Prior to  Admission  Medication Sig Dispense Refill Last Dose  . hydrOXYzine (ATARAX/VISTARIL) 25 MG tablet Take 1 tablet (25 mg total) by mouth every 6 (six) hours as needed for anxiety. (Patient not taking: Reported on 09/26/2016) 30 tablet 0 Not Taking at Unknown time  . naltrexone (DEPADE) 50 MG tablet Take 1 tablet (50 mg total) by mouth daily. (Patient not taking: Reported on 09/26/2016) 30 tablet 0 Not Taking at Unknown time  . nicotine (NICODERM CQ - DOSED IN MG/24 HOURS) 21 mg/24hr patch Place 1 patch (21 mg total) onto the skin daily. (Patient not taking: Reported on 09/26/2016) 28 patch 0 Not Taking at Unknown time  . traZODone (DESYREL) 50 MG tablet Take 1 tablet (50 mg total) by mouth at bedtime as needed for sleep. (Patient not taking: Reported on 09/26/2016) 30 tablet 0 Not Taking at Unknown time    Treatment Modalities: Medication Management, Group therapy, Case management,  1 to 1 session with clinician, Psychoeducation, Recreational therapy.  Patient Stressors: Financial difficulties Substance abuse Patient Strengths: Average or above average intelligence Capable of independent living  Physician Treatment Plan for Primary Diagnosis: Alcohol withdrawal syndrome without complication (HCC) Long Term Goal(s): Improvement in symptoms so as ready for  discharge Short Term Goals: Ability to identify changes in lifestyle to reduce recurrence of condition will improve Ability to verbalize feelings will improve Ability to disclose and discuss suicidal ideas Ability to demonstrate self-control will improve Ability to identify and develop effective coping behaviors will improve Ability to maintain clinical measurements within normal limits will improve Compliance with prescribed medications will improve Ability to identify triggers associated with substance abuse/mental health issues will improve Ability to identify changes in lifestyle to reduce recurrence of condition will improve Ability to  verbalize feelings will improve Ability to disclose and discuss suicidal ideas Ability to demonstrate self-control will improve Ability to identify and develop effective coping behaviors will improve Ability to maintain clinical measurements within normal limits will improve Compliance with prescribed medications will improve Ability to identify triggers associated with substance abuse/mental health issues will improve  Medication Management: Evaluate patient's response, side effects, and tolerance of medication regimen.  Therapeutic Interventions: 1 to 1 sessions, Unit Group sessions and Medication administration.  Evaluation of Outcomes: Progressing  Physician Treatment Plan for Secondary Diagnosis: Principal Problem:   Alcohol withdrawal syndrome without complication (HCC) Active Problems:   Alcoholism (HCC)   Substance induced mood disorder (HCC)   Severe major depression (HCC)  Long Term Goal(s): Improvement in symptoms so as ready for discharge  Short Term Goals: Ability to identify changes in lifestyle to reduce recurrence of condition will improve Ability to verbalize feelings will improve Ability to disclose and discuss suicidal ideas Ability to demonstrate self-control will improve Ability to identify and develop effective coping behaviors will improve Ability to maintain clinical measurements within normal limits will improve Compliance with prescribed medications will improve Ability to identify triggers associated with substance abuse/mental health issues will improve Ability to identify changes in lifestyle to reduce recurrence of condition will improve Ability to verbalize feelings will improve Ability to disclose and discuss suicidal ideas Ability to demonstrate self-control will improve Ability to identify and develop effective coping behaviors will improve Ability to maintain clinical measurements within normal limits will improve Compliance with prescribed  medications will improve Ability to identify triggers associated with substance abuse/mental health issues will improve  Medication Management: Evaluate patient's response, side effects, and tolerance of medication regimen.  Therapeutic Interventions: 1 to 1 sessions, Unit Group sessions and Medication administration.  Evaluation of Outcomes: Progressing  RN Treatment Plan for Primary Diagnosis: Alcohol withdrawal syndrome without complication (HCC) Long Term Goal(s): Knowledge of disease and therapeutic regimen to maintain health will improve  Short Term Goals: Ability to verbalize feelings will improve, Ability to disclose and discuss suicidal ideas and Compliance with prescribed medications will improve  Medication Management: RN will administer medications as ordered by provider, will assess and evaluate patient's response and provide education to patient for prescribed medication. RN will report any adverse and/or side effects to prescribing provider.  Therapeutic Interventions: 1 on 1 counseling sessions, Psychoeducation, Medication administration, Evaluate responses to treatment, Monitor vital signs and CBGs as ordered, Perform/monitor CIWA, COWS, AIMS and Fall Risk screenings as ordered, Perform wound care treatments as ordered.  Evaluation of Outcomes: Progressing  LCSW Treatment Plan for Primary Diagnosis: Alcohol withdrawal syndrome without complication (HCC) Long Term Goal(s): Safe transition to appropriate next level of care at discharge, Engage patient in therapeutic group addressing interpersonal concerns. Short Term Goals: Engage patient in aftercare planning with referrals and resources, Facilitate patient progression through stages of change regarding substance use diagnoses and concerns, Identify triggers associated with mental health/substance abuse issues and Increase skills  for wellness and recovery  Therapeutic Interventions: Assess for all discharge needs, 1 to 1 time  with Social worker, Explore available resources and support systems, Assess for adequacy in community support network, Educate family and significant other(s) on suicide prevention, Complete Psychosocial Assessment, Interpersonal group therapy.  Evaluation of Outcomes: Progressing  Progress in Treatment: Attending groups: No Participating in groups: No Taking medication as prescribed: Yes, MD continues to assess for medication changes as needed Toleration medication: Yes, no side effects reported at this time Family/Significant other contact made: No, CSW assessing for appropriate contact Patient understands diagnosis: Continuing to assess Discussing patient identified problems/goals with staff: Yes Medical problems stabilized or resolved: Yes Denies suicidal/homicidal ideation: Yes Issues/concerns per patient self-inventory: None Other: N/A  New problem(s) identified: None identified at this time.   New Short Term/Long Term Goal(s): None identified at this time.   Discharge Plan or Barriers: CSW still assessing appropriate discharge plan.  Reason for Continuation of Hospitalization:  Anxiety  Depression Medication stabilization Suicidal ideation Withdrawal symptoms  Estimated Length of Stay: 1-4 days; Estimated discharge date 10/03/16  Attendees: Patient: 09/29/2016 12:02 PM  Physician: Dr. Jama Flavors 09/29/2016 12:02 PM  Nursing: Rodell Perna RN; Jesusita Oka, RN 09/29/2016 12:02 PM  RN Care Manager: Onnie Boer, RN 09/29/2016 12:02 PM  Social Worker: Donnelly Stager, LCSWA 09/29/2016 12:02 PM  Recreational Therapist:  09/29/2016 12:02 PM  Other: Armandina Stammer, NP 09/29/2016 12:02 PM  Other:  09/29/2016 12:02 PM  Other: 09/29/2016 12:02 PM  Scribe for Treatment Team: Jonathon Jordan, MSW,LCSWA 09/29/2016 12:02 PM

## 2016-09-29 NOTE — Progress Notes (Signed)
D.  Pt pleasant on approach, minimal interaction.  Pt denies complaints at this time other than continued withdrawal.  Pt did not feel well enough to attend evening wrap up group, minimal interaction on the unit this evening.  Pt denies SI/HI/AVH at this time.  A.  Support and encouragement offered, medication given as ordered  R  Pt remains safe on the unit, will continue to monitor.

## 2016-09-29 NOTE — Progress Notes (Signed)
D   Pt brightens some on approach but does have a sad depressed affect   He has been out on the milieu this evening and attended AA group   Pt reports poor sleep and mild to moderate withdrawal symptoms  A   Verbal support given   Medications administered and effectiveness monitored   Q 15 min checks  R   Pt is safe and receptive to verbal support

## 2016-09-29 NOTE — Progress Notes (Signed)
NUTRITION ASSESSMENT  Pt identified as at risk on the Malnutrition Screen Tool  INTERVENTION: 1. Supplements: Ensure Enlive po TID, each supplement provides 350 kcal and 20 grams of protein 2. Multivitamin with minerals daily  NUTRITION DIAGNOSIS: Unintentional weight loss related to sub-optimal intake as evidenced by pt report.   Goal: Pt to meet >/= 90% of their estimated nutrition needs.  Monitor:  PO intake  Assessment:  Pt admitted with ETOH and other substance abuse (heroin, marijuana). Pt reports drinking 1/5 of liquor daily. Pt attempted to OD on heroin PTA. Pt is homeless. Pt was seen in August 2018 at [facility]. Pt was found to have severe malnutrition based on intake and NFPE.  Pt would benefit from Ensure supplements TID and daily MVI.   Height: Ht Readings from Last 1 Encounters:  09/26/16  (1.854 m)    Weight: Wt Readings from Last 1 Encounters:  09/26/16 135 lb (61.2 kg)    Weight Hx: Wt Readings from Last 10 Encounters:  09/26/16 135 lb (61.2 kg)  08/27/16 130 lb (59 kg)  08/26/16 140 lb (63.5 kg)  08/18/16 140 lb (63.5 kg)  06/02/16 132 lb 11.5 oz (60.2 kg)  01/26/16 144 lb (65.3 kg)  07/09/15 132 lb (59.9 kg)  04/10/15 132 lb (59.9 kg)  04/10/15 140 lb (63.5 kg)  04/06/15 140 lb (63.5 kg)    BMI:  17.8 kg/m^2 Pt meets criteria for underweight based on current BMI.  Estimated Nutritional Needs: Kcal: 25-30 kcal/kg Protein: > 1 gram protein/kg Fluid: 1 ml/kcal  Diet Order: Diet regular Room service appropriate? Yes; Fluid consistency: Thin Pt is also offered choice of unit snacks mid-morning and mid-afternoon.  Pt is eating as desired.   Lab results and medications reviewed.   Tilda Franco, MS, RD, LDN Pager: [phone] After Hours Pager: [phone]
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
## 2016-09-29 NOTE — B[facility] Group Notes (Signed)
LCSW Group Therapy Note   09/29/2016 1:15pm   Type of Therapy and Topic:  Group Therapy:  Overcoming Obstacles   Participation Level: Active   Description of Group:    In this group patients will be encouraged to explore what they see as obstacles to their own wellness and recovery. They will be guided to discuss their thoughts, feelings, and behaviors related to these obstacles. The group will process together ways to cope with barriers, with attention given to specific choices patients can make. Each patient will be challenged to identify changes they are motivated to make in order to overcome their obstacles. This group will be process-oriented, with patients participating in exploration of their own experiences as well as giving and receiving support and challenge from other group members.   Therapeutic Goals: 1. Patient will identify personal and current obstacles as they relate to admission. 2. Patient will identify barriers that currently interfere with their wellness or overcoming obstacles.  3. Patient will identify feelings, thought process and behaviors related to these barriers. 4. Patient will identify two changes they are willing to make to overcome these obstacles:      Therapeutic Modalities:   Cognitive Behavioral Therapy Solution Focused Therapy Motivational Interviewing Relapse Prevention Therapy  Amman Bartel B Shadonna Benedick, MSW, LCSWA 09/29/2016 5:14 PM   

## 2016-09-29 NOTE — Progress Notes (Signed)
Dar Note: Patient presents with flat affect and depressed mood.  Remained withdrawn and isolative to his room most of this shift.  Medications given as prescribed.  Reports withdrawal symptoms of irritability, tremors, and anxiety.  Denies suicidal thoughts, auditory and visual hallucinations.  Support and encouragement offered as needed.  Safety checks maintained every 15 minutes.  Minimal interaction with staff.  Patient is safe on the unit.

## 2016-09-29 NOTE — Progress Notes (Signed)
[facility] MD Progress Note  09/29/2016 11:39 AM CADYN FANN  MRN:  [id number] Subjective:   46 yo Caucasian male, single, never married, no kids, homeless and unemployed. Background history of AUD. Recently discharged from our unit. Patient reports relapse after twenty two days of sobriety. Says he attempted to overdose with heroin the day before. He has been having thoughts of walking in front of a train.  BAL is 303 mg/dl. UDS is positive for heroin. Marked liver enzyme derangement. Other labs are essentially normal.  Chart reviewed today. Patient discussed at team today.  Staff reports that he is still in withdrawals. Pulse is still elevated. He is not grooming self. He is in bed all day. No interaction with peers. Not engaging with unit activities or groups. Has not voiced any suicidal thoughts.   Seen today. Was out in group for the first time today. Notes that shakes are lessening. He is holding down his food. No visual tactile or auditory hallucination. No suicidal thoughts. No thoughts of violence. Encouraged.   Principal Problem: Alcohol withdrawal syndrome without complication (HCC) Diagnosis:   Patient Active Problem List   Diagnosis Date Noted  . Severe major depression (HCC) [F32.2] 09/27/2016  . MDD (major depressive disorder), recurrent severe, without psychosis (HCC) [F33.2] 08/27/2016  . Acute bronchitis [J20.9] 06/03/2016  . Homeless [Z59.0] 06/03/2016  . Alcohol withdrawal (HCC) [F10.239] 06/02/2016  . Tobacco abuse [Z72.0] 06/02/2016  . Protein-calorie malnutrition, severe (HCC) [E43] 07/10/2015  . Transaminitis [R74.0] 07/07/2015  . Hypokalemia [E87.6] 07/07/2015  . Alcohol withdrawal delirium, acute, mixed level of activity (HCC) [F10.231] 07/07/2015  . 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 Alcohol-induced mood disorder (HCC) [F10.94] 04/06/2015  . Alcohol dependence with alcohol-induced mood disorder (HCC) [F10.24]  03/09/2014  . Polysubstance abuse [F19.10] 03/09/2014  . Opioid dependence (HCC) [F11.20] 03/09/2014  . Alcohol use disorder, severe, dependence (HCC) [F10.20] 03/08/2014  . Substance induced mood disorder (HCC) [F19.94] 03/08/2014  . Alcohol abuse [F10.10] 06/28/2013  . Alcohol withdrawal syndrome without complication (HCC) [F10.230] 06/28/2013  . DTs (delirium tremens) (HCC) [F10.231] 06/28/2013  . Marijuana abuse [F12.10] 04/28/2011  . Alcoholism (HCC) [F10.20] 04/27/2011  . Toxic encephalopathy [G92] 04/27/2011  . Psychosis [F29] 04/27/2011  . Leukocytosis [D72.829] 04/27/2011  . Hyponatremia [E87.1] 04/27/2011  . Thrombocytopenia (HCC) [D69.6] 04/27/2011  . Elevated AST (SGOT) [R74.0] 04/27/2011  . Elevated bilirubin [R17] 04/27/2011   Total Time spent with patient: 20 minutes  Past Psychiatric History: As in H&P  Past Medical History:  Past Medical History:  Diagnosis Date  . Anxiety   . Depression   . Elevated liver enzymes   . ETOH abuse   . Hip dislocation   . Seizures (HCC)    ETOH withdrawl   History reviewed. No pertinent surgical history. Family History:  Family History  Problem Relation Age of Onset  . Heart attack Father    Family Psychiatric  History: As in H&P Social History:  History  Alcohol Use  . 4.8 oz/week  . 3 Cans of beer, 5 Shots of liquor per week    Comment: daily     History  Drug Use No    Comment: recend heroine OD on 09/25/16 per pt    Social History   Social History  . Marital status: Single    Spouse name: N/A  . Number of children: N/A  . Years of education: N/A   Social History Main Topics  . Smoking status: Current  Every Day Smoker    Packs/day: 2.00    Types: Cigarettes  . Smokeless tobacco: Never Used  . Alcohol use 4.8 oz/week    3 Cans of beer, 5 Shots of liquor per week     Comment: daily  . Drug use: No     Comment: recend heroine OD on 09/25/16 per pt  . Sexual activity: Yes   Other Topics Concern  . None    Social History Narrative  . None   Additional Social History:      Sleep: Good  Appetite:  Good  Current Medications: Current Facility-Administered Medications  Medication Dose Route Frequency Provider Last Rate Last Dose  . acetaminophen (TYLENOL) tablet 650 mg  650 mg Oral Q6H PRN Jackelyn Poling, NP      . alum & mag hydroxide-simeth (MAALOX/MYLANTA) 200-200-20 MG/5ML suspension 30 mL  30 mL Oral Q4H PRN Nira Conn A, NP      . feeding supplement (ENSURE ENLIVE) (ENSURE ENLIVE) liquid 237 mL  237 mL Oral TID BM Cobos, Rockey Situ, MD      . folic acid (FOLVITE) tablet 1 mg  1 mg Oral Daily Izediuno, Delight Ovens, MD   1 mg at 09/29/16 0841  . gabapentin (NEURONTIN) capsule 300 mg  300 mg Oral TID Cobos, Rockey Situ, MD   300 mg at 09/29/16 0842  . LORazepam (ATIVAN) tablet 2 mg  2 mg Oral Q6H PRN Georgiann Cocker, MD   2 mg at 09/28/16 2146  . LORazepam (ATIVAN) tablet 2 mg  2 mg Oral TID Georgiann Cocker, MD   2 mg at 09/29/16 0842  . magnesium hydroxide (MILK OF MAGNESIA) suspension 30 mL  30 mL Oral Daily PRN Nira Conn A, NP      . multivitamin with minerals tablet 1 tablet  1 tablet Oral Daily Cobos, Fernando A, MD      . naltrexone (DEPADE) tablet 50 mg  50 mg Oral Daily Izediuno, Delight Ovens, MD   50 mg at 09/29/16 0842  . nicotine (NICODERM CQ - dosed in mg/24 hours) patch 21 mg  21 mg Transdermal Daily Cobos, Rockey Situ, MD   21 mg at 09/29/16 0845  . terbinafine (LAMISIL) 1 % cream   Topical BID Nira Conn A, NP      . traZODone (DESYREL) tablet 50 mg  50 mg Oral QHS Izediuno, Delight Ovens, MD   50 mg at 09/28/16 2146    Lab Results:  Results for orders placed or performed during the hospital encounter of 09/27/16 (from the past 48 hour(s))  Lipid panel     Status: None   Collection Time: 09/28/16  6:30 AM  Result Value Ref Range   Cholesterol 198 0 - 200 mg/dL   Triglycerides 161 <096 mg/dL   HDL 75 >04 mg/dL   Total CHOL/HDL Ratio 2.6 RATIO   VLDL 29 0 - 40  mg/dL   LDL Cholesterol 94 0 - 99 mg/dL    Comment:        Total Cholesterol/HDL:CHD Risk Coronary Heart Disease Risk Table                     Men   Women  1/2 Average Risk   3.4   3.3  Average Risk       5.0   4.4  2 X Average Risk   9.6   7.1  3 X Average Risk  23.4   11.0  Use the calculated Patient Ratio above and the CHD Risk Table to determine the patient's CHD Risk.        ATP III CLASSIFICATION (LDL):  <100     mg/dL   Optimal  161-096  mg/dL   Near or Above                    Optimal  130-159  mg/dL   Borderline  045-409  mg/dL   High  >811     mg/dL   Very High Performed at [facility], [address] [place], [place] [address]   TSH     Status: None   Collection Time: 09/28/16  6:30 AM  Result Value Ref Range   TSH 1.275 0.350 - 4.500 uIU/mL    Comment: Performed by a 3rd Generation assay with a functional sensitivity of <=0.01 uIU/mL. Performed at [facility] [address] [place], [place] [address]     Blood Alcohol level:  Lab Results  Component Value Date   ETH 303 [facility]) 09/26/2016   ETH 323 (HH) 08/26/2016    Metabolic Disorder Labs: No results found for: HGBA1C, MPG No results found for: PROLACTIN Lab Results  Component Value Date   CHOL 198 09/28/2016   TRIG 146 09/28/2016   HDL 75 09/28/2016   CHOLHDL 2.6 09/28/2016   VLDL 29 09/28/2016   LDLCALC 94 09/28/2016    Physical Findings: AIMS:  , ,  ,  ,    CIWA:  CIWA-Ar Total: 2 COWS:     Musculoskeletal: Strength & Muscle Tone: within normal limits Gait & Station: broad based Patient leans: N/A  Psychiatric Specialty Exam: Physical Exam  Constitutional: He is oriented to person, place, and time. No distress.  HENT:  Head: Normocephalic and atraumatic.  Respiratory: Effort normal.  Neurological: He is alert and oriented to person, place, and time.  Skin: He is not diaphoretic.  Psychiatric:  As above    ROS  Blood pressure 104/89, pulse 97,  temperature 98 F (36.7 C), temperature source Oral, resp. rate 18.There is no height or weight on file to calculate BMI.  General Appearance: Poor grooming, mild tremors. Not in any distress. Not internally distracted.    Eye Contact:  Better  Speech: More spontaneous. Normal tone  Volume:  Normal  Mood:  Feels better today.    Affect:  Restricted but appropriate   Thought Process:  Linear  Orientation:  Full (Time, Place, and Person)  Thought Content:  No violent thoughts. No hallucination in any modality. No negative ruminations.  Suicidal Thoughts:  No  Homicidal Thoughts:  No  Memory:  Able to register information given. Good immediate and delayed recall.   Judgement: Fair  Insight:  Fair  Psychomotor Activity:  Better today.  Concentration: Good  Recall:  Good  Fund of Knowledge:  Fair  Language:  Good  Akathisia:  Negative  Handed:    AIMS (if indicated):     Assets:  Desire for Improvement Resilience  ADL's:  Impaired  Cognition:  WNL  Sleep:  Number of Hours: 6.75     Assessment and Plan   Patient is still in withdrawals. He is not a danger to himself or others. We are still tapering his Ativan. Needs further evaluation.    Psychiatric: AUD SIMD  Medical: HTN  Psychosocial:  Homelessness Unemployment No support  PLAN: 1. Continue medications at current dose 2. Continue to monitor mood, behavior and interactions  Georgiann Cocker, MD 09/29/2016, 11:39 AMPatient ID: Desmond Lope, male   DOB: [birth year], 46 y.o.   MRN: [id number]

## 2016-09-29 NOTE — Progress Notes (Signed)
Recreation Therapy Notes  Date: 09/29/16 Time: 0930 Location: 300 Dayroom  Group Topic: Stress Management  Goal Area(s) Addresses:  Patient will verbalize importance of using healthy stress management.  Patient will identify positive emotions associated with healthy stress management.   Intervention: Stress Management  Activity :  Meditation.  LRT introduced the stress management technique of meditation.  Patients were to listen and follow along as [name] meditation played from the Calm app.    Education:  Stress Management, Discharge Planning.   Education Outcome: Acknowledges edcuation/In group clarification offered/Needs additional education  Clinical Observations/Feedback: Pt did not attend group.   Jake [name], LRT/CTRS         Jake [name] 09/29/2016 12:09 PM 

## 2016-09-30 DIAGNOSIS — F322 Major depressive disorder, single episode, severe without psychotic features: Principal | ICD-10-CM

## 2016-09-30 NOTE — Progress Notes (Signed)
D   Pt brightens some on approach but does have a sad depressed affect   He has been out on the milieu this evening and attended wrap up group group   Pt reports poor sleep and mild to moderate withdrawal symptoms including shakes increased anxiety and agitation A   Verbal support given   Medications administered and effectiveness monitored   Q 15 min checks  R   Pt is safe and receptive to verbal support

## 2016-09-30 NOTE — Progress Notes (Signed)
DAR NOTE: Patient presents with anxious affect and depressed mood.  Reports visual hallucination seeing shadows.  Reports suicidal thoughts but contracts for safety.  Rates depression at 10, hopelessness at 10, and anxiety at 10.  Reports withdrawal symptoms of tremors, chilling, cravings, agitation, cramping, runny nose and irritability on self inventory form.  Maintained on routine safety checks.  Medications given as prescribed.  Support and encouragement offered as needed.  Attended group and participated.  States goal for today is "think positive and try to stay sober."  Patient remained withdrawn and isolative to his room.

## 2016-09-30 NOTE — [facility] Suicide Risk Assessment (Signed)
[facility] INPATIENT:  Family/Significant Other Suicide Prevention Education  Suicide Prevention Education:  Patient Refusal for Family/Significant Other Suicide Prevention Education: The patient Jake [name] has refused to provide written consent for family/significant other to be provided Family/Significant Other Suicide Prevention Education during admission and/or prior to discharge.  Physician notified.  Jonathon Jordan, MSW, LCSWA  09/30/2016, 9:00 AM

## 2016-09-30 NOTE — Progress Notes (Signed)
[facility] MD Progress Note  09/30/2016 3:35 PM Jake [name]  MRN:  [id number]  Subjective: Jake [name] reports, "I don't feel good. I'm depressed. I'm lying down here wondering how long I'm going to remain homeless. What am I going to do about this homeless situation? I'm living on the streets since losing my job 9 months ago. I have been drinking heavily & using drugs because there is nothing else to do on the streets. Right now, I'm having hot flashes, abdominal cramps, I feel like bugs are crawling on my skin. I'm here voices & still feeling suicidal". Jake [name] is able to contract for safety verbally.  Objective: 46 yo Caucasian male, single, never married, no kids, homeless and unemployed. Background history of AUD. Recently discharged from our unit. Patient reports relapse after twenty two days of sobriety. Says he attempted to overdose with heroin the day before. He has been having thoughts of walking in front of a train.  BAL is 303 mg/dl. UDS is positive for heroin. Marked liver enzyme derangement. Other labs are essentially normal.  Chart reviewed today. Patient discussed at team today.  Staff reports that he is still in withdrawals. Pulse is still elevated. He is not grooming self. He is in bed all day most of the day. No interaction with peers. Not engaging with unit activities or groups. Has not voiced any suicidal thoughts.   Seen today. Was in his room by himself. Notes that shakes are lessening. He is holding down his food. No visual tactile or auditory hallucination. No suicidal thoughts. No thoughts of violence. Encouraged.   Principal Problem: Alcohol withdrawal syndrome without complication (HCC) Diagnosis:   Patient Active Problem List   Diagnosis Date Noted  . Severe major depression (HCC) [F32.2] 09/27/2016  . MDD (major depressive disorder), recurrent severe, without psychosis (HCC) [F33.2] 08/27/2016  . Acute bronchitis [J20.9] 06/03/2016  . Homeless [Z59.0] 06/03/2016  .  Alcohol withdrawal (HCC) [F10.239] 06/02/2016  . Tobacco abuse [Z72.0] 06/02/2016  . Protein-calorie malnutrition, severe (HCC) [E43] 07/10/2015  . Transaminitis [R74.0] 07/07/2015  . Hypokalemia [E87.6] 07/07/2015  . Alcohol withdrawal delirium, acute, mixed level of activity (HCC) [F10.231] 07/07/2015  . 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 Alcohol-induced mood disorder (HCC) [F10.94] 04/06/2015  . 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 Polysubstance abuse [F19.10] 03/09/2014  . Opioid dependence (HCC) [F11.20] 03/09/2014  . Alcohol use disorder, severe, dependence (HCC) [F10.20] 03/08/2014  . Substance induced mood disorder (HCC) [F19.94] 03/08/2014  . Alcohol abuse [F10.10] 06/28/2013  . Alcohol withdrawal syndrome without complication (HCC) [F10.230] 06/28/2013  . DTs (delirium tremens) (HCC) [F10.231] 06/28/2013  . Marijuana abuse [F12.10] 04/28/2011  . Alcoholism (HCC) [F10.20] 04/27/2011  . Toxic encephalopathy [G92] 04/27/2011  . Psychosis [F29] 04/27/2011  . Leukocytosis [D72.829] 04/27/2011  . Hyponatremia [E87.1] 04/27/2011  . Thrombocytopenia (HCC) [D69.6] 04/27/2011  . Elevated AST (SGOT) [R74.0] 04/27/2011  . Elevated bilirubin [R17] 04/27/2011   Total Time spent with patient: 15 minutes  Past Psychiatric History: As in H&P  Past Medical History:  Past Medical History:  Diagnosis Date  . Anxiety   . Depression   . Elevated liver enzymes   . ETOH abuse   . Hip dislocation   . Seizures (HCC)    ETOH withdrawl   History reviewed. No pertinent surgical history. Family History:  Family History  Problem Relation Age of Onset  . Heart attack Father    Family Psychiatric  History:  As in H&P  Social History:  History  Alcohol Use  . 4.8 oz/week  . 3 Cans of beer, 5 Shots of liquor per week    Comment: daily     History  Drug Use No    Comment: recend heroine  OD on 09/25/16 per pt    Social History   Social History  . Marital status: Single    Spouse name: N/A  . Number of children: N/A  . Years of education: N/A   Social History Main Topics  . Smoking status: Current Every Day Smoker    Packs/day: 2.00    Types: Cigarettes  . Smokeless tobacco: Never Used  . Alcohol use 4.8 oz/week    3 Cans of beer, 5 Shots of liquor per week     Comment: daily  . Drug use: No     Comment: recend heroine OD on 09/25/16 per pt  . Sexual activity: Yes   Other Topics Concern  . None   Social History Narrative  . None   Additional Social History:      Sleep: Good  Appetite:  Good  Current Medications: Current Facility-Administered Medications  Medication Dose Route Frequency Provider Last Rate Last Dose  . acetaminophen (TYLENOL) tablet 650 mg  650 mg Oral Q6H PRN Nira Conn A, NP      . alum & mag hydroxide-simeth (MAALOX/MYLANTA) 200-200-20 MG/5ML suspension 30 mL  30 mL Oral Q4H PRN Nira Conn A, NP      . feeding supplement (ENSURE ENLIVE) (ENSURE ENLIVE) liquid 237 mL  237 mL Oral TID BM Cobos, Rockey Situ, MD   237 mL at 09/30/16 1400  . folic acid (FOLVITE) tablet 1 mg  1 mg Oral Daily Izediuno, Delight Ovens, MD   1 mg at 09/30/16 0806  . gabapentin (NEURONTIN) capsule 300 mg  300 mg Oral TID Cobos, Rockey Situ, MD   300 mg at 09/30/16 1200  . LORazepam (ATIVAN) tablet 2 mg  2 mg Oral Q6H PRN Georgiann Cocker, MD   2 mg at 09/29/16 2151  . magnesium hydroxide (MILK OF MAGNESIA) suspension 30 mL  30 mL Oral Daily PRN Nira Conn A, NP      . multivitamin with minerals tablet 1 tablet  1 tablet Oral Daily Cobos, Rockey Situ, MD   1 tablet at 09/30/16 0805  . naltrexone (DEPADE) tablet 50 mg  50 mg Oral Daily Izediuno, Delight Ovens, MD   50 mg at 09/30/16 0806  . nicotine (NICODERM CQ - dosed in mg/24 hours) patch 21 mg  21 mg Transdermal Daily Cobos, Rockey Situ, MD   21 mg at 09/30/16 0807  . terbinafine (LAMISIL) 1 % cream   Topical BID  Nira Conn A, NP      . traZODone (DESYREL) tablet 50 mg  50 mg Oral QHS Izediuno, Delight Ovens, MD   50 mg at 09/29/16 2150   Lab Results:  Results for orders placed or performed during the hospital encounter of 09/27/16 (from the past 48 hour(s))  Hemoglobin A1c     Status: Abnormal   Collection Time: 09/29/16  6:41 PM  Result Value Ref Range   Hgb A1c MFr Bld 5.7 (H) 4.8 - 5.6 %    Comment: (NOTE) Pre diabetes:          5.7%-6.4% Diabetes:              >6.4% Glycemic control for   <7.0% adults with diabetes    Mean  Plasma Glucose 116.89 mg/dL    Comment: Performed at [facility], [address] [place], [place] [address]   Blood Alcohol level:  Lab Results  Component Value Date   ETH 303 [facility]) 09/26/2016   ETH 323 (HH) 08/26/2016   Metabolic Disorder Labs: Lab Results  Component Value Date   HGBA1C 5.7 (H) 09/29/2016   MPG 116.89 09/29/2016   No results found for: PROLACTIN Lab Results  Component Value Date   CHOL 198 09/28/2016   TRIG 146 09/28/2016   HDL 75 09/28/2016   CHOLHDL 2.6 09/28/2016   VLDL 29 09/28/2016   LDLCALC 94 09/28/2016   Physical Findings: AIMS: Facial and Oral Movements Muscles of Facial Expression: None, normal Lips and Perioral Area: None, normal Jaw: None, normal Tongue: None, normal,Extremity Movements Upper (arms, wrists, hands, fingers): None, normal Lower (legs, knees, ankles, toes): None, normal, Trunk Movements Neck, shoulders, hips: None, normal, Overall Severity Severity of abnormal movements (highest score from questions above): None, normal Incapacitation due to abnormal movements: None, normal Patient's awareness of abnormal movements (rate only patient's report): No Awareness, Dental Status Current problems with teeth and/or dentures?: No Does patient usually wear dentures?: No  CIWA:  CIWA-Ar Total: 4 COWS:     Musculoskeletal: Strength & Muscle Tone: within normal limits Gait & Station: broad based Patient  leans: N/A  Psychiatric Specialty Exam: Physical Exam  Constitutional: He is oriented to person, place, and time. No distress.  HENT:  Head: Normocephalic and atraumatic.  Respiratory: Effort normal.  Neurological: He is alert and oriented to person, place, and time.  Skin: He is not diaphoretic.  Psychiatric:  As above    ROS  Blood pressure 108/64, pulse 99, temperature 98.4 F (36.9 C), temperature source Oral, resp. rate 18.There is no height or weight on file to calculate BMI.  General Appearance: Poor grooming, mild tremors. Not in any distress. Not internally distracted.    Eye Contact:  Better  Speech: More spontaneous. Normal tone  Volume:  Normal  Mood:  Feels better today.    Affect:  Restricted but appropriate   Thought Process:  Linear  Orientation:  Full (Time, Place, and Person)  Thought Content:  No violent thoughts. No hallucination in any modality. No negative ruminations.  Suicidal Thoughts:  No  Homicidal Thoughts:  No  Memory:  Able to register information given. Good immediate and delayed recall.   Judgement: Fair  Insight:  Fair  Psychomotor Activity:  Better today.  Concentration: Good  Recall:  Good  Fund of Knowledge:  Fair  Language:  Good  Akathisia:  Negative  Handed:    AIMS (if indicated):     Assets:  Desire for Improvement Resilience  ADL's:  Impaired  Cognition:  WNL  Sleep:  Number of Hours: 6.75   Assessment and Plan  Patient is still in withdrawals. He is not a danger to himself or others. We are still tapering his Ativan. Needs further evaluation.    Psychiatric: AUD SIMD  Medical: HTN  Psychosocial:  Homelessness Unemployment No support  PLAN: 1. Continue medications at current dose. -Gabapentin 300 mg tid for agitation/substance withdrawal syndrome. -Naltrexone 50 mg daily for alcoholism. -Trazodone 50 mg at bedtime for sleep. -Ativan 2 mg prn for CIWA Q 6 hours. 2. Continue to monitor mood, behavior and  interactions.  Sanjuana Kava, NP, PMHNP, FNP-BC. 09/30/2016, 3:35 PMPatient ID: Desmond Lope, male   DOB: 01-29-1970, 46 y.o.   MRN: [id number]

## 2016-09-30 NOTE — Progress Notes (Signed)
Recreation Therapy Notes  Animal-Assisted Activity (AAA) Program Checklist/Progress Notes Patient Eligibility Criteria Checklist & Daily Group note for Rec TxIntervention  Date: 09.18.2018 Time: 2:45pm Location: 400 Hall Dayroom   AAA/T Program Assumption of Risk Form signed by Patient/ or Parent Legal Guardian Yes  Patient is free of allergies or sever asthma Yes  Patient reports no fear of animals Yes  Patient reports no history of cruelty to animals Yes  Patient understands his/her participation is voluntary Yes  Behavioral Response: Did not attend  Kathi Dohn L Consuelo Suthers, LRT/CTRS       Kendale Rembold L 09/30/2016 3:02 PM 

## 2016-09-30 NOTE — [facility] Group Notes (Signed)
[facility] Group Therapy 09/30/2016 1:15pm  Type of Therapy: [facility] [employer] Presentation  Participation Level: Active  Participation Quality: Attentive  Affect: Appropriate  Cognitive: Oriented  Insight: Developing/Improving  Engagement in Therapy: Engaged  Modes of Intervention: Discussion, Education and Socialization  Summary of Progress/Problems: Mental [employer] Association (MHA) Speaker came to talk about his personal journey with mental health. The pt processed ways by which to relate to the speaker. MHA speaker provided handouts and educational information pertaining to groups and services offered by the MHA. Pt was engaged in speaker's presentation and was receptive to resources provided.    Honest Vanleer N Smart, LCSW 09/30/2016 11:42 AM  

## 2016-09-30 NOTE — [facility] Group Notes (Signed)
Pt was sleeping and did not attend wrap up group.

## 2016-10-01 NOTE — Progress Notes (Signed)
Patient did attend the evening speaker NA meeting.  

## 2016-10-01 NOTE — Progress Notes (Signed)
Recreation Therapy Notes  Date: 10/01/16 Time: 0930 Location: [address] Dayroom  Group Topic: Stress Management  Goal Area(s) Addresses:  Patient will verbalize importance of using healthy stress management.  Patient will identify positive emotions associated with healthy stress management.   Intervention: Stress Management  Activity :  Progressive Muscle Relaxation.  LRT introduced the stress management technique of progressive muscle relaxation.  LRT read a script that instructed patients to tense then relax each muscle group individually.  Patients were to follow along as the script was read to fully engage in the practice.  Education:  Stress Management, Discharge Planning.   Education Outcome: Acknowledges edcuation/In group clarification offered/Needs additional education  Clinical Observations/Feedback: Pt did not attend group.    Spyros Winch, LRT/CTRS         Simran Mannis A 10/01/2016 12:22 PM 

## 2016-10-01 NOTE — Progress Notes (Signed)
Patient ID: Jake [name], male   DOB: 1970-02-04, 46 y.o.   MRN: [id number]  DAR: Pt. Denies HI and visual Hallucinations. He endorses passive SI and auditory hallucinations. He is able to contract for safety. He does continue to report withdrawal symptoms. He reports some dizziness and lightheadedness this morning. He is steady on his feet and vital signs are WDL. He reports sleep is fair, appetite is fair, energy level is low, and concentration is poor. He rates depression, hopelessness, and anxiety 10/10. Patient does not report any pain or discomfort at this time. Support and encouragement provided to the patient. Scheduled medications administered to patient per physician's orders. Patient is minimal but cooperative. He spends most of his time laying in bed throughout the day. Q15 minute checks are maintained for safety.

## 2016-10-01 NOTE — Progress Notes (Signed)
[facility] MD Progress Note  10/01/2016 2:29 PM [name]  MRN:  [id number]  Subjective: Jake [name] reports, "I;m still feeling depressed. I'm having the shakes, cold/hot sweats. I did get up to shower this morning, went to break & lunch, that was about it. I guess I'm going back to the streets after I get discharged from this hospital because I got no where else to go".  Objective: 46 yo Caucasian male, single, never married, no kids, homeless and unemployed. Background history of AUD. Recently discharged from our unit. Patient reports relapse after twenty two days of sobriety. Says he attempted to overdose with heroin the day before. He has been having thoughts of walking in front of a train.  BAL is 303 mg/dl. UDS is positive for heroin. Marked liver enzyme derangement. Other labs are essentially normal.  Chart reviewed today. Patient discussed at team today.  Staff reports that he is still in withdrawals. Pulse rate is normal today. He took a shower today. He is in most of the day, only gets up for meals. No interaction with peers. Not engaging with unit activities or groups. Has not voiced any suicidal thoughts.   Seen today. Was in his room by himself, lying down in bed. Notes that shakes are lessening, although continues to report worsening shakes. He is holding down his food. He is endorsing auditory hallucinations & suicidal thoughts. No thoughts of violence. Encouraged to get out of the room & attend groups. Able to verbally contract for safety.  Principal Problem: Alcohol withdrawal syndrome without complication (HCC) Diagnosis:   Patient Active Problem List   Diagnosis Date Noted  . Severe major depression (HCC) [F32.2] 09/27/2016  . MDD (major depressive disorder), recurrent severe, without psychosis (HCC) [F33.2] 08/27/2016  . Acute bronchitis [J20.9] 06/03/2016  . Homeless [Z59.0] 06/03/2016  . Alcohol withdrawal (HCC) [F10.239] 06/02/2016  . Tobacco abuse [Z72.0] 06/02/2016  .  Protein-calorie malnutrition, severe (HCC) [E43] 07/10/2015  . Transaminitis [R74.0] 07/07/2015  . Hypokalemia [E87.6] 07/07/2015  . Alcohol withdrawal delirium, acute, mixed level of activity (HCC) [F10.231] 07/07/2015  . 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 Alcohol-induced mood disorder (HCC) [F10.94] 04/06/2015  . 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 Polysubstance abuse [F19.10] 03/09/2014  . Opioid dependence (HCC) [F11.20] 03/09/2014  . Alcohol use disorder, severe, dependence (HCC) [F10.20] 03/08/2014  . Substance induced mood disorder (HCC) [F19.94] 03/08/2014  . Alcohol abuse [F10.10] 06/28/2013  . Alcohol withdrawal syndrome without complication (HCC) [F10.230] 06/28/2013  . DTs (delirium tremens) (HCC) [F10.231] 06/28/2013  . Marijuana abuse [F12.10] 04/28/2011  . Alcoholism (HCC) [F10.20] 04/27/2011  . Toxic encephalopathy [G92] 04/27/2011  . Psychosis [F29] 04/27/2011  . Leukocytosis [D72.829] 04/27/2011  . Hyponatremia [E87.1] 04/27/2011  . Thrombocytopenia (HCC) [D69.6] 04/27/2011  . Elevated AST (SGOT) [R74.0] 04/27/2011  . Elevated bilirubin [R17] 04/27/2011   Total Time spent with patient: 15 minutes  Past Psychiatric History: As in H&P  Past Medical History:  Past Medical History:  Diagnosis Date  . Anxiety   . Depression   . Elevated liver enzymes   . ETOH abuse   . Hip dislocation   . Seizures (HCC)    ETOH withdrawl   History reviewed. No pertinent surgical history. Family History:  Family History  Problem Relation Age of Onset  . Heart attack Father    Family Psychiatric  History: As in H&P  Social History:  History  Alcohol Use  . 4.8  oz/week  . 3 Cans of beer, 5 Shots of liquor per week    Comment: daily     History  Drug Use No    Comment: recend heroine OD on 09/25/16 per pt    Social History   Social History  . Marital status: Single     Spouse name: N/A  . Number of children: N/A  . Years of education: N/A   Social History Main Topics  . Smoking status: Current Every Day Smoker    Packs/day: 2.00    Types: Cigarettes  . Smokeless tobacco: Never Used  . Alcohol use 4.8 oz/week    3 Cans of beer, 5 Shots of liquor per week     Comment: daily  . Drug use: No     Comment: recend heroine OD on 09/25/16 per pt  . Sexual activity: Yes   Other Topics Concern  . None   Social History Narrative  . None   Additional Social History:      Sleep: Good  Appetite:  Good  Current Medications: Current Facility-Administered Medications  Medication Dose Route Frequency Provider Last Rate Last Dose  . acetaminophen (TYLENOL) tablet 650 mg  650 mg Oral Q6H PRN Nira Conn A, NP      . alum & mag hydroxide-simeth (MAALOX/MYLANTA) 200-200-20 MG/5ML suspension 30 mL  30 mL Oral Q4H PRN Nira Conn A, NP      . feeding supplement (ENSURE ENLIVE) (ENSURE ENLIVE) liquid 237 mL  237 mL Oral TID BM Cobos, Rockey Situ, MD   237 mL at 10/01/16 0901  . folic acid (FOLVITE) tablet 1 mg  1 mg Oral Daily Izediuno, Delight Ovens, MD   1 mg at 10/01/16 0858  . gabapentin (NEURONTIN) capsule 300 mg  300 mg Oral TID Cobos, Rockey Situ, MD   300 mg at 10/01/16 1210  . LORazepam (ATIVAN) tablet 2 mg  2 mg Oral Q6H PRN Georgiann Cocker, MD   2 mg at 09/30/16 1730  . magnesium hydroxide (MILK OF MAGNESIA) suspension 30 mL  30 mL Oral Daily PRN Nira Conn A, NP      . multivitamin with minerals tablet 1 tablet  1 tablet Oral Daily Cobos, Rockey Situ, MD   1 tablet at 10/01/16 0900  . naltrexone (DEPADE) tablet 50 mg  50 mg Oral Daily Izediuno, Delight Ovens, MD   50 mg at 10/01/16 0858  . nicotine (NICODERM CQ - dosed in mg/24 hours) patch 21 mg  21 mg Transdermal Daily Cobos, Rockey Situ, MD   21 mg at 10/01/16 0859  . terbinafine (LAMISIL) 1 % cream   Topical BID Nira Conn A, NP      . traZODone (DESYREL) tablet 50 mg  50 mg Oral QHS Izediuno,  Delight Ovens, MD   50 mg at 09/30/16 2056   Lab Results:  Results for orders placed or performed during the hospital encounter of 09/27/16 (from the past 48 hour(s))  Hemoglobin A1c     Status: Abnormal   Collection Time: 09/29/16  6:41 PM  Result Value Ref Range   Hgb A1c MFr Bld 5.7 (H) 4.8 - 5.6 %    Comment: (NOTE) Pre diabetes:          5.7%-6.4% Diabetes:              >6.4% Glycemic control for   <7.0% adults with diabetes    Mean Plasma Glucose 116.89 mg/dL    Comment: Performed at [facility],  [address] [place], [place] [address]   Blood Alcohol level:  Lab Results  Component Value Date   ETH 303 [facility]) 09/26/2016   ETH 323 (HH) 08/26/2016   Metabolic Disorder Labs: Lab Results  Component Value Date   HGBA1C 5.7 (H) 09/29/2016   MPG 116.89 09/29/2016   No results found for: PROLACTIN Lab Results  Component Value Date   CHOL 198 09/28/2016   TRIG 146 09/28/2016   HDL 75 09/28/2016   CHOLHDL 2.6 09/28/2016   VLDL 29 09/28/2016   LDLCALC 94 09/28/2016   Physical Findings: AIMS: Facial and Oral Movements Muscles of Facial Expression: None, normal Lips and Perioral Area: None, normal Jaw: None, normal Tongue: None, normal,Extremity Movements Upper (arms, wrists, hands, fingers): None, normal Lower (legs, knees, ankles, toes): None, normal, Trunk Movements Neck, shoulders, hips: None, normal, Overall Severity Severity of abnormal movements (highest score from questions above): None, normal Incapacitation due to abnormal movements: None, normal Patient's awareness of abnormal movements (rate only patient's report): No Awareness, Dental Status Current problems with teeth and/or dentures?: No Does patient usually wear dentures?: No  CIWA:  CIWA-Ar Total: 5 COWS:     Musculoskeletal: Strength & Muscle Tone: within normal limits Gait & Station: broad based Patient leans: N/A  Psychiatric Specialty Exam: Physical Exam  Constitutional: He is oriented  to person, place, and time. No distress.  HENT:  Head: Normocephalic and atraumatic.  Respiratory: Effort normal.  Neurological: He is alert and oriented to person, place, and time.  Skin: He is not diaphoretic.  Psychiatric:  As above    ROS  Blood pressure 110/74, pulse 72, temperature 98.1 F (36.7 C), temperature source Oral, resp. rate 16.There is no height or weight on file to calculate BMI.  General Appearance: Poor grooming, mild tremors. Not in any distress. Not internally distracted.    Eye Contact:  Better  Speech: More spontaneous. Normal tone  Volume:  Normal  Mood:  Feels better today.    Affect:  Restricted but appropriate   Thought Process:  Linear  Orientation:  Full (Time, Place, and Person)  Thought Content:  No violent thoughts. No hallucination in any modality. No negative ruminations.  Suicidal Thoughts:  No  Homicidal Thoughts:  No  Memory:  Able to register information given. Good immediate and delayed recall.   Judgement: Fair  Insight:  Fair  Psychomotor Activity:  Better today.  Concentration: Good  Recall:  Good  Fund of Knowledge:  Fair  Language:  Good  Akathisia:  Negative  Handed:    AIMS (if indicated):     Assets:  Desire for Improvement Resilience  ADL's:  Impaired  Cognition:  WNL  Sleep:  Number of Hours: 7   Assessment and Plan  Patient is still in withdrawals. He is not a danger to himself or others. However, he is still endorsing suicidal ideations without plans or intent. Able to contract for safety. We are still tapering his Ativan. Needs further evaluation.    Psychiatric: AUD SIMD  Medical: HTN  Psychosocial:  Homelessness Unemployment No support system  PLAN: 1. Continue medications at current dose. -Gabapentin 300 mg tid for agitation/substance withdrawal syndrome. -Naltrexone 50 mg daily for alcoholism. -Trazodone 50 mg at bedtime for sleep. -Ativan 2 mg prn for CIWA Q 6 hours. 2. Continue to monitor mood,  behavior and interactions.  Sanjuana Kava, NP, PMHNP, FNP-BC. 10/01/2016, 2:29 PMPatient ID: Jake [name], male   DOB: 1970/08/06, 46 y.o.   MRN:  [id number]  

## 2016-10-01 NOTE — [facility] Group Notes (Signed)
LCSW Group Therapy Note  10/01/2016 1:15pm  Type of Therapy/Topic:  Group Therapy:  Emotion Regulation  Participation Level:  Did Not Attend -pt invited. Chose to remain in bed.   Description of Group:   The purpose of this group is to assist patients in learning to regulate negative emotions and experience positive emotions. Patients will be guided to discuss ways in which they have been vulnerable to their negative emotions. These vulnerabilities will be juxtaposed with experiences of positive emotions or situations, and patients will be challenged to use positive emotions to combat negative ones. Special emphasis will be placed on coping with negative emotions in conflict situations, and patients will process healthy conflict resolution skills.  Therapeutic Goals: 1. Patient will identify two positive emotions or experiences to reflect on in order to balance out negative emotions 2. Patient will label two or more emotions that they find the most difficult to experience 3. Patient will demonstrate positive conflict resolution skills through discussion and/or role plays  Summary of Patient Progress:   x    Therapeutic Modalities:   Cognitive Behavioral Therapy Feelings Identification Dialectical Behavioral Therapy   Ledell Peoples Smart, LCSW 10/01/2016 3:56 PM

## 2016-10-02 DIAGNOSIS — F17210 Nicotine dependence, cigarettes, uncomplicated: Secondary | ICD-10-CM

## 2016-10-02 DIAGNOSIS — T1491XA Suicide attempt, initial encounter: Secondary | ICD-10-CM

## 2016-10-02 DIAGNOSIS — T401X2A Poisoning by heroin, intentional self-harm, initial encounter: Secondary | ICD-10-CM

## 2016-10-02 MED ORDER — NALTREXONE HCL 50 MG PO TABS: 50.0000 mg | ORAL_TABLET | Freq: Every day | ORAL | 0 refills | Status: AC

## 2016-10-02 MED ORDER — GABAPENTIN 300 MG PO CAPS: 300.0000 mg | ORAL_CAPSULE | Freq: Three times a day (TID) | ORAL | 0 refills | Status: DC

## 2016-10-02 MED ORDER — TRAZODONE HCL 50 MG PO TABS: 50.0000 mg | ORAL_TABLET | Freq: Every day | ORAL | 0 refills | Status: AC

## 2016-10-02 MED ORDER — HYDROXYZINE HCL 25 MG PO TABS: 25.0000 mg | ORAL_TABLET | Freq: Three times a day (TID) | ORAL | 0 refills | Status: AC | PRN

## 2016-10-02 NOTE — [facility] Suicide Risk Assessment (Signed)
[facility] Discharge Suicide Risk Assessment   Principal Problem: Alcohol withdrawal syndrome without complication [facility]) Discharge Diagnoses:  Patient Active Problem List   Diagnosis Date Noted  . Severe major depression (HCC) [F32.2] 09/27/2016  . MDD (major depressive disorder), recurrent severe, without psychosis (HCC) [F33.2] 08/27/2016  . Acute bronchitis [J20.9] 06/03/2016  . Homeless [Z59.0] 06/03/2016  . Alcohol withdrawal (HCC) [F10.239] 06/02/2016  . Tobacco abuse [Z72.0] 06/02/2016  . Protein-calorie malnutrition, severe (HCC) [E43] 07/10/2015  . Transaminitis [R74.0] 07/07/2015  . Hypokalemia [E87.6] 07/07/2015  . Alcohol withdrawal delirium, acute, mixed level of activity (HCC) [F10.231] 07/07/2015  . 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 Alcohol-induced mood disorder (HCC) [F10.94] 04/06/2015  . 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 Polysubstance abuse [F19.10] 03/09/2014  . Opioid dependence (HCC) [F11.20] 03/09/2014  . Alcohol use disorder, severe, dependence (HCC) [F10.20] 03/08/2014  . Substance induced mood disorder (HCC) [F19.94] 03/08/2014  . Alcohol abuse [F10.10] 06/28/2013  . Alcohol withdrawal syndrome without complication (HCC) [F10.230] 06/28/2013  . DTs (delirium tremens) (HCC) [F10.231] 06/28/2013  . Marijuana abuse [F12.10] 04/28/2011  . Alcoholism (HCC) [F10.20] 04/27/2011  . Toxic encephalopathy [G92] 04/27/2011  . Psychosis [F29] 04/27/2011  . Leukocytosis [D72.829] 04/27/2011  . Hyponatremia [E87.1] 04/27/2011  . Thrombocytopenia (HCC) [D69.6] 04/27/2011  . Elevated AST (SGOT) [R74.0] 04/27/2011  . Elevated bilirubin [R17] 04/27/2011    Total Time spent with patient: 30 minutes  Musculoskeletal: Strength & Muscle Tone: within normal limits Gait & Station: normal Patient leans: N/A  Psychiatric Specialty Exam: ROS  Denies chest pain, no shortness of  breath, no vomiting, no diarrhea, no fever   Blood pressure 124/74, pulse 81, temperature 98.3 F (36.8 C), temperature source Oral, resp. rate 16.There is no height or weight on file to calculate BMI.  General Appearance: Fairly Groomed  Patent attorney::  Fair  Speech:  Normal [id number]  Volume:  Normal  Mood:  reports some improvement , less depression  Affect:  mildly anxious, but reactive   Thought Process:  Linear and Descriptions of Associations: Intact  Orientation:  Full (Time, Place, and Person)  Thought Content:  describes improved hallucinations , describes distant noises and whispers at times. Not internally preoccupied, no delusions expressed   Suicidal Thoughts:  No denies suicidal plan or intention  Homicidal Thoughts:  No denies homicidal ideations  Memory:  recent and remote grossly intact   Judgement:  Fair  Insight:  Fair  Psychomotor Activity:  Normal- no tremors , no diaphoresis, no acute distress or discomfort  Concentration:  Good  Recall:  Good  Fund of Knowledge:Good  Language: Good  Akathisia:  Negative  Handed:  Right  AIMS (if indicated):     Assets:  Communication Skills Desire for Improvement Resilience  Sleep:  Number of Hours: 5  Cognition: WNL  ADL's:  Intact   Mental Status Per Nursing Assessment::   On Admission:     Demographic Factors:  46 year old single male, no children, currently homeless, no legal issues   Loss Factors: Homeless   Historical Factors: History of alcohol use disorder , history of depression.   Risk Reduction Factors:   Positive coping skills or problem solving skills  Continued Clinical Symptoms:  At this time patient is alert, attentive, fair eye contact, no psychomotor agitation, no current tremors or diaphoresis, vitals are stable, presents comfortable and in no acute distress . Mood is described as partially improved but reports ongoing  sadness and anxiety related to homelessness . Denies suicidal ideations,  denies homicidal ideations, denies  hallucinations, no delusions, not internally preoccupied He is currently future oriented, states he plans to start going to AA meetings regularly  . Denies medication side effects  As discussed with staff, CSW, patient has declined referral to Rehab or residential settings   Cognitive Features That Contribute To Risk:  No gross cognitive deficits noted upon discharge. Is alert , attentive, and oriented x 3   Suicide Risk:  Mild:  Suicidal ideation of limited frequency, intensity, duration, and specificity.  There are no identifiable plans, no associated intent, mild dysphoria and related symptoms, good self-control (both objective and subjective assessment), few other risk factors, and identifiable protective factors, including available and accessible social support.  Follow-up Information    [facility] Follow up on 10/08/2016.   Specialty:  Behavioral Health Why:  Hospital follow-up on Wednesday at 8:15AM. Thank you.  Contact information: [address] [place] [address] [phone]           Plan Of Care/Follow-up recommendations:  Activity:  as tolerated Diet:  Regular  Tests:  NA Other:  See below  Patient has requested discharge- no current grounds for involuntary commitment  Encouraged to follow up as above, and to go to AA meetings regularly.   Craige Cotta, MD 10/02/2016, 11:33 AM

## 2016-10-02 NOTE — Progress Notes (Signed)
D    Pt is appropriate and pleasant   He reports some mild withdrawal symptoms   He attends and participates in groups and interacts appropriately with staff and peers   He said he will probably be discharged tomorrow and he is probably going to have to go back to the streets since he is homeless and is not looking forward to that A    Verbal support given    Medications administered and efffectiveness monitored    Q 15 min checks D   Pt remains ssafe and receptive to verbal support
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
## 2016-10-02 NOTE — Progress Notes (Signed)
  [facility] Adult Case Management Discharge Plan :  Will you be returning to the same living situation after discharge:  Yes,  returning to shelter or street (pt declined referral for residential treatment) given [facility] house/halfway house information but declined to call. At discharge, do you have transportation home?: Yes,  bus pass Do you have the ability to pay for your medications: Yes,  mental health  Release of information consent forms completed and submitted to medical records by CSW.  Patient to Follow up at: Follow-up Information    [facility] Follow up on 10/08/2016.   Specialty:  Behavioral Health Why:  Hospital follow-up on Wednesday at 8:15AM. Thank you.  Contact information: [address] [place] [address] [phone]           Next level of care provider has access to [facility] Link:no  Safety Planning and Suicide Prevention discussed: Yes,  SPE completed with pt; pt declined to consent to family contact. SPI pamphlet and Mobile Crisis information provided.  Have you used any form of tobacco in the last 30 days? (Cigarettes, Smokeless Tobacco, Cigars, and/or Pipes): Yes  Has patient been referred to the Quitline?: Patient refused referral  Patient has been referred for addiction treatment: Yes  [employer], LCSW 10/02/2016, 10:19 AM

## 2016-10-02 NOTE — Progress Notes (Signed)
Pt discharged home on a bus pass. Pt was ambulatory, stable and appreciative at that time. All papers and prescriptions were given and valuables returned. Verbal understanding expressed. Denies SI/HI and A/VH. Pt given opportunity to express concerns and ask questions.  

## 2016-10-09 ENCOUNTER — Emergency Department (HOSPITAL_COMMUNITY): Admission: EM | Admit: 2016-10-09 | Discharge: 2016-10-09 | Discharge status: Against Medical Advice | Payer: Self-pay

## 2016-10-09 NOTE — ED Notes (Signed)
Patient called x3 for triage 

## 2016-10-09 NOTE — ED Notes (Signed)
Pt called for triage no response from lobby 

## 2016-10-09 NOTE — ED Notes (Signed)
Bed: [facility] Expected date:  Expected time:  Means of arrival:  Comments: 

## 2016-10-10 ENCOUNTER — Emergency Department (HOSPITAL_COMMUNITY)
Admission: EM | Admit: 2016-10-10 | Discharge: 2016-10-11 | Disposition: A | Discharge status: Home / Self Care | Payer: Self-pay | Attending: Emergency Medicine | Admitting: Emergency Medicine

## 2016-10-10 ENCOUNTER — Encounter (HOSPITAL_COMMUNITY): Payer: Self-pay

## 2016-10-10 DIAGNOSIS — F333 Major depressive disorder, recurrent, severe with psychotic symptoms: Secondary | ICD-10-CM | POA: Insufficient documentation

## 2016-10-10 DIAGNOSIS — Y908 Blood alcohol level of 240 mg/100 ml or more: Secondary | ICD-10-CM | POA: Insufficient documentation

## 2016-10-10 DIAGNOSIS — R45851 Suicidal ideations: Secondary | ICD-10-CM | POA: Insufficient documentation

## 2016-10-10 DIAGNOSIS — F1910 Other psychoactive substance abuse, uncomplicated: Secondary | ICD-10-CM | POA: Insufficient documentation

## 2016-10-10 DIAGNOSIS — Z79899 Other long term (current) drug therapy: Secondary | ICD-10-CM | POA: Insufficient documentation

## 2016-10-10 DIAGNOSIS — F1024 Alcohol dependence with alcohol-induced mood disorder: Secondary | ICD-10-CM | POA: Diagnosis present

## 2016-10-10 DIAGNOSIS — F10120 Alcohol abuse with intoxication, uncomplicated: Secondary | ICD-10-CM | POA: Insufficient documentation

## 2016-10-10 DIAGNOSIS — F1010 Alcohol abuse, uncomplicated: Secondary | ICD-10-CM

## 2016-10-10 DIAGNOSIS — F17210 Nicotine dependence, cigarettes, uncomplicated: Secondary | ICD-10-CM | POA: Insufficient documentation

## 2016-10-10 LAB — COMPREHENSIVE METABOLIC PANEL
ALBUMIN: 3.9 g/dL (ref 3.5–5.0)
ALT: 124 U/L — ABNORMAL HIGH (ref 17–63)
AST: 87 U/L — AB (ref 15–41)
Alkaline Phosphatase: 103 U/L (ref 38–126)
Anion gap: 9 (ref 5–15)
BUN: 8 mg/dL (ref 6–20)
CHLORIDE: 103 mmol/L (ref 101–111)
CO2: 27 mmol/L (ref 22–32)
Calcium: 8.8 mg/dL — ABNORMAL LOW (ref 8.9–10.3)
Creatinine, Ser: 0.71 mg/dL (ref 0.61–1.24)
GFR calc Af Amer: >60 mL/min (ref >60)
GFR calc non Af Amer: >60 mL/min (ref >60)
GLUCOSE: 120 mg/dL — AB (ref 65–99)
POTASSIUM: 3.8 mmol/L (ref 3.5–5.1)
SODIUM: 139 mmol/L (ref 135–145)
TOTAL PROTEIN: 7.5 g/dL (ref 6.5–8.1)
Total Bilirubin: 0.7 mg/dL (ref 0.3–1.2)

## 2016-10-10 LAB — CBC
HEMATOCRIT: 43 % (ref 39.0–52.0)
Hemoglobin: 15.1 g/dL (ref 13.0–17.0)
MCH: 30.8 pg (ref 26.0–34.0)
MCHC: 35.1 g/dL (ref 30.0–36.0)
MCV: 87.6 fL (ref 78.0–100.0)
PLATELETS: 315 10*3/uL (ref 150–400)
RBC: 4.91 MIL/uL (ref 4.22–5.81)
RDW: 13.2 % (ref 11.5–15.5)
WBC: 10.3 10*3/uL (ref 4.0–10.5)

## 2016-10-10 LAB — RAPID URINE DRUG SCREEN, HOSP PERFORMED
AMPHETAMINES: NOT DETECTED
BARBITURATES: NOT DETECTED
BENZODIAZEPINES: NOT DETECTED
COCAINE: NOT DETECTED
Opiates: NOT DETECTED
Tetrahydrocannabinol: NOT DETECTED

## 2016-10-10 LAB — ETHANOL: Alcohol, Ethyl (B): 327 mg/dL (ref <10)

## 2016-10-10 LAB — ACETAMINOPHEN LEVEL

## 2016-10-10 LAB — SALICYLATE LEVEL: Salicylate Lvl: <7 mg/dL (ref 2.8–30.0)

## 2016-10-10 MED ORDER — LORAZEPAM 1 MG PO TABS
0.0000 mg | ORAL_TABLET | Freq: Four times a day (QID) | ORAL | Status: DC
Start: 2016-10-10 — End: 2016-10-10

## 2016-10-10 MED ORDER — LORAZEPAM 1 MG PO TABS
1.0000 mg | ORAL_TABLET | Freq: Four times a day (QID) | ORAL | Status: DC
  Administered 2016-10-10 – 2016-10-11 (×3): 1 mg via ORAL
  Filled 2016-10-10 (×3): qty 1

## 2016-10-10 MED ORDER — VITAMIN B-1 100 MG PO TABS: 100.0000 mg | ORAL_TABLET | Freq: Every day | ORAL | Status: DC

## 2016-10-10 MED ORDER — LORAZEPAM 2 MG/ML IJ SOLN: 0.0000 mg | Freq: Four times a day (QID) | INTRAMUSCULAR | Status: DC

## 2016-10-10 MED ORDER — LORAZEPAM 2 MG/ML IJ SOLN: 0.0000 mg | Freq: Two times a day (BID) | INTRAMUSCULAR | Status: DC

## 2016-10-10 MED ORDER — LORAZEPAM 1 MG PO TABS
0.0000 mg | ORAL_TABLET | Freq: Two times a day (BID) | ORAL | Status: DC
Start: 2016-10-12 — End: 2016-10-10

## 2016-10-10 MED ORDER — ONDANSETRON 4 MG PO TBDP: 4.0000 mg | ORAL_TABLET | Freq: Four times a day (QID) | ORAL | Status: DC | PRN

## 2016-10-10 MED ORDER — LOPERAMIDE HCL 2 MG PO CAPS: 2.0000 mg | ORAL_CAPSULE | ORAL | Status: DC | PRN

## 2016-10-10 MED ORDER — THIAMINE HCL 100 MG/ML IJ SOLN: 100.0000 mg | Freq: Once | INTRAMUSCULAR | Status: DC

## 2016-10-10 MED ORDER — HYDROXYZINE HCL 25 MG PO TABS: 25.0000 mg | ORAL_TABLET | Freq: Four times a day (QID) | ORAL | Status: DC | PRN

## 2016-10-10 MED ORDER — LORAZEPAM 1 MG PO TABS: 1.0000 mg | ORAL_TABLET | Freq: Two times a day (BID) | ORAL | Status: DC

## 2016-10-10 MED ORDER — VITAMIN B-1 100 MG PO TABS
100.0000 mg | ORAL_TABLET | Freq: Every day | ORAL | Status: DC
  Administered 2016-10-10 – 2016-10-11 (×2): 100 mg via ORAL
  Filled 2016-10-10 (×2): qty 1

## 2016-10-10 MED ORDER — THIAMINE HCL 100 MG/ML IJ SOLN: 100.0000 mg | Freq: Every day | INTRAMUSCULAR | Status: DC

## 2016-10-10 MED ORDER — ADULT MULTIVITAMIN W/MINERALS CH
1.0000 | ORAL_TABLET | Freq: Every day | ORAL | Status: DC
  Administered 2016-10-10 – 2016-10-11 (×2): 1 via ORAL
  Filled 2016-10-10 (×2): qty 1

## 2016-10-10 MED ORDER — LORAZEPAM 1 MG PO TABS
1.0000 mg | ORAL_TABLET | Freq: Four times a day (QID) | ORAL | Status: DC | PRN
  Administered 2016-10-10: 1 mg via ORAL
  Filled 2016-10-10: qty 1

## 2016-10-10 MED ORDER — LORAZEPAM 1 MG PO TABS: 1.0000 mg | ORAL_TABLET | Freq: Three times a day (TID) | ORAL | Status: DC

## 2016-10-10 MED ORDER — NICOTINE 21 MG/24HR TD PT24: 21.0000 mg | MEDICATED_PATCH | Freq: Once | TRANSDERMAL | Status: DC

## 2016-10-10 MED ORDER — LORAZEPAM 1 MG PO TABS: 1.0000 mg | ORAL_TABLET | Freq: Every day | ORAL | Status: DC

## 2016-10-10 NOTE — ED Provider Notes (Signed)
[facility]-EMERGENCY DEPT Provider Note   CSN: [id number] Arrival date & time: 10/10/16  0946     History   Chief Complaint Chief Complaint  Patient presents with  . Medical Clearance  . Suicidal    HPI LEELAN RAJEWSKI is a 46 y.o. male.  Patient is a 46 year old male who presents with suicidal ideations.he has a history of alcohol abuse and alcohol withdrawal seizures. He is currently homeless. He states that he last had an alcoholic drink about 3 minutes prior to arrival. He states that he feels like he has bugs crawling all over him and that he is withdrawing from alcohol. He states he is hearing voices and feels like he doesn't have any point to go on living. He's had thoughts of jumping out in front of a train. He does state he also uses drugs including heroin, methamphetamines and cocaine.      Past Medical History:  Diagnosis Date  . Anxiety   . Depression   . Elevated liver enzymes   . ETOH abuse   . Hip dislocation   . Seizures (HCC)    ETOH withdrawl    Patient Active Problem List   Diagnosis Date Noted  . Severe major depression (HCC) 09/27/2016  . MDD (major depressive disorder), recurrent severe, without psychosis (HCC) 08/27/2016  . Acute bronchitis 06/03/2016  . Homeless 06/03/2016  . Alcohol withdrawal (HCC) 06/02/2016  . Tobacco abuse 06/02/2016  . Protein-calorie malnutrition, severe (HCC) 07/10/2015  . Transaminitis 07/07/2015  . Hypokalemia 07/07/2015  . Alcohol withdrawal delirium, acute, mixed level of activity (HCC) 07/07/2015  . Severe recurrent major depression without psychotic features (HCC) 04/10/2015  . 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 Alcohol dependence with alcohol-induced mood disorder (HCC) 03/09/2014  . Polysubstance abuse 03/09/2014  . Opioid dependence (HCC) 03/09/2014  . Alcohol use disorder, severe, dependence (HCC) 03/08/2014  . Substance induced mood disorder (HCC) 03/08/2014  . Alcohol abuse 06/28/2013  .  Alcohol withdrawal syndrome without complication (HCC) 06/28/2013  . DTs (delirium tremens) (HCC) 06/28/2013  . Marijuana abuse 04/28/2011  . Alcoholism (HCC) 04/27/2011  . Toxic encephalopathy 04/27/2011  . Psychosis 04/27/2011  . Leukocytosis 04/27/2011  . Hyponatremia 04/27/2011  . Thrombocytopenia (HCC) 04/27/2011  . Elevated AST (SGOT) 04/27/2011  . Elevated bilirubin 04/27/2011    Past Surgical History:  Procedure Laterality Date  . FACIAL FRACTURE SURGERY         Home Medications    Prior to Admission medications   Medication Sig Start Date End Date Taking? Authorizing Provider  gabapentin (NEURONTIN) 300 MG capsule Take 1 capsule (300 mg total) by mouth 3 (three) times daily. Patient not taking: Reported on 10/10/2016 10/02/16   Money, Gerlene Burdock, FNP  hydrOXYzine (ATARAX/VISTARIL) 25 MG tablet Take 1 tablet (25 mg total) by mouth 3 (three) times daily as needed for anxiety. Patient not taking: Reported on 10/10/2016 10/02/16   Money, Gerlene Burdock, FNP  naltrexone (DEPADE) 50 MG tablet Take 1 tablet (50 mg total) by mouth daily. For alcoholism Patient not taking: Reported on 10/10/2016 10/03/16   Money, Gerlene Burdock, FNP  traZODone (DESYREL) 50 MG tablet Take 1 tablet (50 mg total) by mouth at bedtime. Patient not taking: Reported on 10/10/2016 10/02/16   Money, Gerlene Burdock, FNP    Family History Family History  Problem Relation Age of Onset  . Heart attack Father     Social History Social History  Substance Use Topics  . Smoking status: Current Every Day Smoker  Packs/day: 2.00    Types: Cigarettes  . Smokeless tobacco: Never Used  . Alcohol use 4.8 oz/week    3 Cans of beer, 5 Shots of liquor per week     Comment: daily     Allergies   Patient has no known allergies.   Review of Systems Review of Systems  Constitutional: Negative for chills, diaphoresis, fatigue and fever.  HENT: Negative for congestion, rhinorrhea and sneezing.   Eyes: Negative.   Respiratory:  Negative for cough, chest tightness and shortness of breath.   Cardiovascular: Negative for chest pain and leg swelling.  Gastrointestinal: Negative for abdominal pain, blood in stool, diarrhea, nausea and vomiting.  Genitourinary: Negative for difficulty urinating, flank pain, frequency and hematuria.  Musculoskeletal: Negative for arthralgias and back pain.  Skin: Negative for rash.  Neurological: Negative for dizziness, speech difficulty, weakness, numbness and headaches.  Psychiatric/Behavioral: Positive for suicidal ideas. The patient is nervous/anxious.      Physical Exam Updated Vital Signs BP (!) 133/94   Pulse 79   Temp 98.4 F (36.9 C) (Oral)   Resp 14   Ht  (1.854 m)   Wt 61.2 kg (135 lb)   SpO2 96%   BMI 17.81 kg/m   Physical Exam  Constitutional: He is oriented to person, place, and time. He appears well-developed and well-nourished.  disheveled  HENT:  Head: Normocephalic and atraumatic.  Eyes: Pupils are equal, round, and reactive to light.  Neck: Normal range of motion. Neck supple.  Cardiovascular: Normal rate, regular rhythm and normal heart sounds.   Pulmonary/Chest: Effort normal and breath sounds normal. No respiratory distress. He has no wheezes. He has no rales. He exhibits no tenderness.  Abdominal: Soft. Bowel sounds are normal. There is no tenderness. There is no rebound and no guarding.  Musculoskeletal: Normal range of motion. He exhibits no edema.  Lymphadenopathy:    He has no cervical adenopathy.  Neurological: He is alert and oriented to person, place, and time.  Skin: Skin is warm and dry. No rash noted.  Psychiatric: He has a normal mood and affect.     ED Treatments / Results  Labs (all labs ordered are listed, but only abnormal results are displayed) Labs Reviewed  COMPREHENSIVE METABOLIC PANEL - Abnormal; Notable for the following:       Result Value   Glucose, Bld 120 (*)    Calcium 8.8 (*)    AST 87 (*)    ALT 124 (*)     All other components within normal limits  ETHANOL - Abnormal; Notable for the following:    Alcohol, Ethyl (B) 327 (*)    All other components within normal limits  ACETAMINOPHEN LEVEL - Abnormal; Notable for the following:    Acetaminophen (Tylenol), Serum <10 (*)    All other components within normal limits  SALICYLATE LEVEL  CBC  RAPID URINE DRUG SCREEN, HOSP PERFORMED    EKG  EKG Interpretation None       Radiology No results found.  Procedures Procedures (including critical care time)  Medications Ordered in ED Medications  nicotine (NICODERM CQ - dosed in mg/24 hours) patch 21 mg (0 mg Transdermal Hold 10/10/16 1323)  multivitamin with minerals tablet 1 tablet (0 tablets Oral Hold 10/10/16 1338)  LORazepam (ATIVAN) tablet 1 mg (not administered)  hydrOXYzine (ATARAX/VISTARIL) tablet 25 mg (not administered)  loperamide (IMODIUM) capsule 2-4 mg (not administered)  ondansetron (ZOFRAN-ODT) disintegrating tablet 4 mg (not administered)  LORazepam (ATIVAN) tablet 1 mg (  0 mg Oral Hold 10/10/16 1322)    Followed by  LORazepam (ATIVAN) tablet 1 mg (not administered)    Followed by  LORazepam (ATIVAN) tablet 1 mg (not administered)    Followed by  LORazepam (ATIVAN) tablet 1 mg (not administered)  thiamine (B-1) injection 100 mg (100 mg Intramuscular Refused 10/10/16 1319)  thiamine (VITAMIN B-1) tablet 100 mg (not administered)     Initial Impression / Assessment and Plan / ED Course  I have reviewed the triage vital signs and the nursing notes.  Pertinent labs & imaging results that were available during my care of the patient were reviewed by me and considered in my medical decision making (see chart for details).     Patient is a 46 year old male who presents with suicidal ideations. His alcohol level is elevated in the 300 range. On my evaluation he is conversing without slurred speech, he is alert and oriented. He doesn't have any symptoms concerning for current  withdrawal.  I did wait about 4-5 hours and requested a TTS evaluation. This is currently pending.  Final Clinical Impressions(s) / ED Diagnoses   Final diagnoses:  Suicidal ideation  Alcohol abuse    New Prescriptions New Prescriptions   No medications on file     Rolan Bucco, MD 10/10/16 1500

## 2016-10-10 NOTE — ED Notes (Signed)
Pt has been changed into scrubs and wanded by security.  

## 2016-10-10 NOTE — ED Triage Notes (Signed)
Patient c/o feeling suicidal. Patient states his plan was to "jump in front of 2 [employer] trains and let the trains mangle his body because I do not give a f---" Patient states, "I'm homeless, I do not have family. I do not see a need for me to live any longer." Patient states he drank"a 40 ounce approx 30 minutes ago. I have to drink in order to function."  Patient states he last used heroin a week ago. Patient states he smokes marijuana, meth, and Molly. I do it all. Patient denies HI. Patient states he hears voices that tell him to kill himself. Patient also reports seeing weird dark clouds in front of him.

## 2016-10-10 NOTE — ED Notes (Signed)
Called for triage no answer  

## 2016-10-10 NOTE — Progress Notes (Signed)
10/10/16 1346:  LRT went to pt room to offer activities, pt declined.Caroll Rancher, LRT/CTRS

## 2016-10-10 NOTE — ED Notes (Signed)
Bed: [facility] Expected date:  Expected time:  Means of arrival:  Comments: 
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
## 2016-10-10 NOTE — BH Assessment (Signed)
[facility] Assessment Progress Note   Case was staffed with Shaune Pollack DNP who recommended detox protocol until medically stable.

## 2016-10-10 NOTE — ED Notes (Signed)
Bed: [facility]39 Expected date:  Expected time:  Means of arrival:  Comments: TR 4  

## 2016-10-11 DIAGNOSIS — F17210 Nicotine dependence, cigarettes, uncomplicated: Secondary | ICD-10-CM

## 2016-10-11 DIAGNOSIS — R451 Restlessness and agitation: Secondary | ICD-10-CM

## 2016-10-11 DIAGNOSIS — F1190 Opioid use, unspecified, uncomplicated: Secondary | ICD-10-CM

## 2016-10-11 DIAGNOSIS — F1024 Alcohol dependence with alcohol-induced mood disorder: Secondary | ICD-10-CM

## 2016-10-11 DIAGNOSIS — R4587 Impulsiveness: Secondary | ICD-10-CM

## 2016-10-11 DIAGNOSIS — F332 Major depressive disorder, recurrent severe without psychotic features: Secondary | ICD-10-CM

## 2016-10-11 DIAGNOSIS — F1290 Cannabis use, unspecified, uncomplicated: Secondary | ICD-10-CM

## 2016-10-11 DIAGNOSIS — F1490 Cocaine use, unspecified, uncomplicated: Secondary | ICD-10-CM

## 2016-10-11 MED ORDER — GABAPENTIN 300 MG PO CAPS
300.0000 mg | ORAL_CAPSULE | Freq: Two times a day (BID) | ORAL | Status: DC
  Administered 2016-10-11: 300 mg via ORAL
  Filled 2016-10-11: qty 1

## 2016-10-11 NOTE — ED Notes (Signed)
Patient continues to endorse SI with a plan to jump in front of a train. Patient denies HI/AVH at this time. Plan of care discussed. Encouragement and support provided and safety maintain. Q 15 min safety checks in place and video monitoring.

## 2016-10-11 NOTE — [facility] Suicide Risk Assessment (Signed)
Suicide Risk Assessment  Discharge Assessment   [facility] Discharge Suicide Risk Assessment   Principal Problem: Alcohol dependence with alcohol-induced mood disorder [facility]) Discharge Diagnoses:  Patient Active Problem List   Diagnosis Date Noted  . Severe major depression (HCC) [F32.2] 09/27/2016  . MDD (major depressive disorder), recurrent severe, without psychosis (HCC) [F33.2] 08/27/2016  . Acute bronchitis [J20.9] 06/03/2016  . Homeless [Z59.0] 06/03/2016  . Alcohol withdrawal (HCC) [F10.239] 06/02/2016  . Tobacco abuse [Z72.0] 06/02/2016  . Protein-calorie malnutrition, severe (HCC) [E43] 07/10/2015  . Transaminitis [R74.0] 07/07/2015  . Hypokalemia [E87.6] 07/07/2015  . Alcohol withdrawal delirium, acute, mixed level of activity (HCC) [F10.231] 07/07/2015  . 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 Alcohol use with alcohol-induced mood disorder (HCC) [F10.94] 04/06/2015  . 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 Polysubstance abuse [F19.10] 03/09/2014  . Opioid dependence (HCC) [F11.20] 03/09/2014  . Alcohol use disorder, severe, dependence (HCC) [F10.20] 03/08/2014  . Substance induced mood disorder (HCC) [F19.94] 03/08/2014  . Alcohol abuse [F10.10] 06/28/2013  . Alcohol withdrawal syndrome without complication (HCC) [F10.230] 06/28/2013  . DTs (delirium tremens) (HCC) [F10.231] 06/28/2013  . Marijuana abuse [F12.10] 04/28/2011  . Alcoholism (HCC) [F10.20] 04/27/2011  . Toxic encephalopathy [G92] 04/27/2011  . Psychosis [F29] 04/27/2011  . Leukocytosis [D72.829] 04/27/2011  . Hyponatremia [E87.1] 04/27/2011  . Thrombocytopenia (HCC) [D69.6] 04/27/2011  . Elevated AST (SGOT) [R74.0] 04/27/2011  . Elevated bilirubin [R17] 04/27/2011    Total Time spent with patient: 45 minutes  Musculoskeletal: Strength & Muscle Tone: within normal limits Gait & Station: normal Patient leans:  N/A   Psychiatric Specialty Exam: Physical Exam  Constitutional: He is oriented to person, place, and time. He appears well-developed.  HENT:  Head: Normocephalic.  Respiratory: Effort normal.  Musculoskeletal: Normal range of motion.  Neurological: He is alert and oriented to person, place, and time.  Psychiatric: His speech is normal. Thought content normal. He is agitated. Cognition and memory are normal. He expresses impulsivity. He exhibits a depressed mood.   Review of Systems  Psychiatric/Behavioral: Positive for depression and substance abuse. Negative for hallucinations, memory loss and suicidal ideas. The patient is not nervous/anxious and does not have insomnia.   All other systems reviewed and are negative.  Blood pressure 114/87, pulse 70, temperature 98.3 F (36.8 C), temperature source Oral, resp. rate 16, height  (1.854 m), weight 61.2 kg (135 lb), SpO2 98 %.Body mass index is 17.81 kg/m. General Appearance: Disheveled Eye Contact:  Fair Speech:  Clear and Coherent Volume:  Normal Mood:  Depressed and Irritable Affect:  Congruent and Depressed Thought Process:  Coherent and Linear Orientation:  Full (Time, Place, and Person) Thought Content:  Illogical Suicidal Thoughts:  No Homicidal Thoughts:  No Memory:  Immediate;   Good Recent;   Fair Remote;   Fair Judgement:  Poor Insight:  Lacking Psychomotor Activity:  Normal Concentration:  Concentration: Good and Attention Span: Fair Recall:  Good Fund of Knowledge:  Good Language:  Good Akathisia:  No Handed:  Right AIMS (if indicated):    Assets:  Architect Resilience ADL's:  Intact Cognition:  WNL   Mental Status Per Nursing Assessment::   On Admission:   Suicidal ideation with alcohol ideation  Demographic Factors:  Male and Low socioeconomic status  Loss Factors: Financial problems/change in socioeconomic status  Historical  Factors: Impulsivity  Risk Reduction Factors:   Sense of responsibility to family and Living with  another person, especially a relative  Continued Clinical Symptoms:  Depression:   Comorbid alcohol abuse/dependence Impulsivity Alcohol/Substance Abuse/Dependencies More than one psychiatric diagnosis Previous Psychiatric Diagnoses and Treatments  Cognitive Features That Contribute To Risk:  Closed-mindedness    Suicide Risk:  Minimal: No identifiable suicidal ideation.  Patients presenting with no risk factors but with morbid ruminations; may be classified as minimal risk based on the severity of the depressive symptoms    Plan Of Care/Follow-up recommendations:  Activity:  as tolerated Diet:  Heart Healthy  Laveda Abbe, NP 10/11/2016, 12:58 PM

## 2016-10-11 NOTE — Consult Note (Signed)
[place] [facility] Consult   Reason for Consult:  Intoxication Referring Physician:  EDP Patient Identification: Jake [name] MRN:  [id number] Principal Diagnosis: Alcohol dependence with alcohol-induced mood disorder [facility]) Diagnosis:   Patient Active Problem List   Diagnosis Date Noted  . Severe major depression ([place]) [F32.2] 09/27/2016  . MDD (major depressive disorder), recurrent severe, without psychosis ([place]) [F33.2] 08/27/2016  . Acute bronchitis [J20.9] 06/03/2016  . Homeless [Z59.0] 06/03/2016  . Alcohol withdrawal ([place]) [F10.239] 06/02/2016  . Tobacco abuse [Z72.0] 06/02/2016  . Protein-calorie malnutrition, severe ([place]) [E43] 07/10/2015  . Transaminitis [R74.0] 07/07/2015  . Hypokalemia [E87.6] 07/07/2015  . Alcohol withdrawal delirium, acute, mixed level of activity ([place]) [F10.231] 07/07/2015  . Severe recurrent major depression without psychotic features ([place]) [F33.2] 04/10/2015  . Alcohol use with alcohol-induced mood disorder ([place]) [F10.94] 04/06/2015  . Alcohol dependence with alcohol-induced mood disorder ([place]) [F10.24] 03/09/2014  . Polysubstance abuse [F19.10] 03/09/2014  . Opioid dependence ([place]) [F11.20] 03/09/2014  . Alcohol use disorder, severe, dependence ([place]) [F10.20] 03/08/2014  . Substance induced mood disorder ([place]) [F19.94] 03/08/2014  . Alcohol abuse [F10.10] 06/28/2013  . Alcohol withdrawal syndrome without complication ([place]) [Z99.357] 06/28/2013  . DTs (delirium tremens) ([place]) [F10.231] 06/28/2013  . Marijuana abuse [F12.10] 04/28/2011  . Alcoholism ([place]) [F10.20] 04/27/2011  . Toxic encephalopathy [G92] 04/27/2011  . Psychosis [F29] 04/27/2011  . Leukocytosis [D72.829] 04/27/2011  . Hyponatremia [E87.1] 04/27/2011  . Thrombocytopenia ([place]) [D69.6] 04/27/2011  . Elevated AST (SGOT) [R74.0] 04/27/2011  . Elevated bilirubin [R17] 04/27/2011    Total Time spent with patient: 45 minutes  Subjective:   Jake [name] is a 46 y.o.  male patient admitted with suicidal ideations with alcohol intoxication.  HPI:  Pt was seen and chart reviewed with treatment team and Dr Darleene Cleaver. Pt's BAL 327 and UDS negative on admission. Pt was previously hospitalized at [facility] on 8/15 and 9/15 for the same issue. Pt stated he did not follow up with outpatient resources on discharge because he had to work and then he relapsed.  Pt denies suicidal/homicidal ideation, denies auditory/visual hallucinations and does not appear to be responding to internal stimuli. Pt was seen by Peer Support services and stated to them that all he wants to do is drink and declined their help. Pt will be referred to [facility] for substance abuse treatment, if he chooses to go. Pt is psychiatrically clear for discharge.   Past Psychiatric History: As above  Risk to Self: None Risk to Others: None Prior Inpatient Therapy: Prior Inpatient Therapy: Yes Prior Therapy Dates: 2018, 2017, 2016 Prior Therapy Facilty/Provider(s): [facility] Reason for Treatment: SA, ALCOHOL ABUSE  Prior Outpatient Therapy: Prior Outpatient Therapy: Yes Prior Therapy Dates: 2 to 3 years ago Prior Therapy Facilty/Provider(s): Unable to recall Reason for Treatment: Unable to recall Does patient have an ACCT team?: No Does patient have Intensive In-House Services?  : No Does patient have [facility] services? : No Does patient have P4CC services?: No  Past Medical History:  Past Medical History:  Diagnosis Date  . Anxiety   . Depression   . Elevated liver enzymes   . ETOH abuse   . Hip dislocation   . Seizures ([place])    ETOH withdrawl    Past Surgical History:  Procedure Laterality Date  . FACIAL FRACTURE SURGERY     Family History:  Family History  Problem Relation Age of Onset  . Heart attack Father    Family Psychiatric  History: Unknown Social  History:  History  Alcohol Use  . 4.8 oz/week  . 3 Cans of beer, 5 Shots of liquor per week    Comment: daily      History  Drug Use  . Types: Marijuana, Cocaine, IV, Heroin, Oxycodone, "Crack" cocaine, Methamphetamines    Comment: recent heroine OD on 09/25/16 per pt, and molly    Social History   Social History  . Marital status: Single    Spouse name: N/A  . Number of children: N/A  . Years of education: N/A   Social History Main Topics  . Smoking status: Current Every Day Smoker    Packs/day: 2.00    Types: Cigarettes  . Smokeless tobacco: Never Used  . Alcohol use 4.8 oz/week    3 Cans of beer, 5 Shots of liquor per week     Comment: daily  . Drug use: Yes    Types: Marijuana, Cocaine, IV, Heroin, Oxycodone, "Crack" cocaine, Methamphetamines     Comment: recent heroine OD on 09/25/16 per pt, and molly  . Sexual activity: Yes   Other Topics Concern  . None   Social History Narrative  . None   Additional Social History:    Allergies:  No Known Allergies  Labs:  Results for orders placed or performed during the hospital encounter of 10/10/16 (from the past 48 hour(s))  Rapid urine drug screen (hospital performed)     Status: None   Collection Time: 10/10/16 10:58 AM  Result Value Ref Range   Opiates NONE DETECTED NONE DETECTED   Cocaine NONE DETECTED NONE DETECTED   Benzodiazepines NONE DETECTED NONE DETECTED   Amphetamines NONE DETECTED NONE DETECTED   Tetrahydrocannabinol NONE DETECTED NONE DETECTED   Barbiturates NONE DETECTED NONE DETECTED    Comment:        DRUG SCREEN FOR MEDICAL PURPOSES ONLY.  IF CONFIRMATION IS NEEDED FOR ANY PURPOSE, NOTIFY LAB WITHIN 5 DAYS.        LOWEST DETECTABLE LIMITS FOR URINE DRUG SCREEN Drug Class       Cutoff (ng/mL) Amphetamine      1000 Barbiturate      200 Benzodiazepine   329 Tricyclics       518 Opiates          300 Cocaine          300 THC              50   Comprehensive metabolic panel     Status: Abnormal   Collection Time: 10/10/16 11:24 AM  Result Value Ref Range   Sodium 139 135 - 145 mmol/L   Potassium 3.8 3.5  - 5.1 mmol/L   Chloride 103 101 - 111 mmol/L   CO2 27 22 - 32 mmol/L   Glucose, Bld 120 (H) 65 - 99 mg/dL   BUN 8 6 - 20 mg/dL   Creatinine, Ser 0.71 0.61 - 1.24 mg/dL   Calcium 8.8 (L) 8.9 - 10.3 mg/dL   Total Protein 7.5 6.5 - 8.1 g/dL   Albumin 3.9 3.5 - 5.0 g/dL   AST 87 (H) 15 - 41 U/L   ALT 124 (H) 17 - 63 U/L   Alkaline Phosphatase 103 38 - 126 U/L   Total Bilirubin 0.7 0.3 - 1.2 mg/dL   GFR calc non Af Amer >60 >60 mL/min   GFR calc Af Amer >60 >60 mL/min    Comment: (NOTE) The eGFR has been calculated using the CKD EPI equation. This calculation has not been  validated in all clinical situations. eGFR's persistently <60 mL/min signify possible Chronic Kidney Disease.    Anion gap 9 5 - 15  Ethanol     Status: Abnormal   Collection Time: 10/10/16 11:24 AM  Result Value Ref Range   Alcohol, Ethyl (B) 327 (HH) <10 mg/dL    Comment:        LOWEST DETECTABLE LIMIT FOR SERUM ALCOHOL IS 10 mg/dL FOR MEDICAL PURPOSES ONLY Please note change in reference range. CRITICAL RESULT CALLED TO, READ BACK BY AND VERIFIED WITH: L.LONDON RN [id number] A.[email or web address]   Salicylate level     Status: None   Collection Time: 10/10/16 11:24 AM  Result Value Ref Range   Salicylate Lvl <7.3 2.8 - 30.0 mg/dL  Acetaminophen level     Status: Abnormal   Collection Time: 10/10/16 11:24 AM  Result Value Ref Range   Acetaminophen (Tylenol), Serum <10 (L) 10 - 30 ug/mL    Comment:        THERAPEUTIC CONCENTRATIONS VARY SIGNIFICANTLY. A RANGE OF 10-30 ug/mL MAY BE AN EFFECTIVE CONCENTRATION FOR MANY PATIENTS. HOWEVER, SOME ARE BEST TREATED AT CONCENTRATIONS OUTSIDE THIS RANGE. ACETAMINOPHEN CONCENTRATIONS >150 ug/mL AT 4 HOURS AFTER INGESTION AND >50 ug/mL AT 12 HOURS AFTER INGESTION ARE OFTEN ASSOCIATED WITH TOXIC REACTIONS.   cbc     Status: None   Collection Time: 10/10/16 11:24 AM  Result Value Ref Range   WBC 10.3 4.0 - 10.5 K/uL   RBC 4.91 4.22 - 5.81 MIL/uL   Hemoglobin 15.1 13.0  - 17.0 g/dL   HCT 43.0 39.0 - 52.0 %   MCV 87.6 78.0 - 100.0 fL   MCH 30.8 26.0 - 34.0 pg   MCHC 35.1 30.0 - 36.0 g/dL   RDW 13.2 11.5 - 15.5 %   Platelets 315 150 - 400 K/uL    Current Facility-Administered Medications  Medication Dose Route Frequency Provider Last Rate Last Dose  . gabapentin (NEURONTIN) capsule 300 mg  300 mg Oral BID Zohal Reny, MD      . hydrOXYzine (ATARAX/VISTARIL) tablet 25 mg  25 mg Oral Q6H PRN Patrecia Pour, NP      . loperamide (IMODIUM) capsule 2-4 mg  2-4 mg Oral PRN Patrecia Pour, NP      . LORazepam (ATIVAN) tablet 1 mg  1 mg Oral Q6H PRN Patrecia Pour, NP   1 mg at 10/10/16 1820  . LORazepam (ATIVAN) tablet 1 mg  1 mg Oral QID Patrecia Pour, NP   1 mg at 10/11/16 1031   Followed by  . LORazepam (ATIVAN) tablet 1 mg  1 mg Oral TID Patrecia Pour, NP       Followed by  . [START ON 10/12/2016] LORazepam (ATIVAN) tablet 1 mg  1 mg Oral BID Patrecia Pour, NP       Followed by  . [START ON 10/14/2016] LORazepam (ATIVAN) tablet 1 mg  1 mg Oral Daily Waylan Boga Y, NP      . multivitamin with minerals tablet 1 tablet  1 tablet Oral Daily Patrecia Pour, NP   1 tablet at 10/11/16 1031  . nicotine (NICODERM CQ - dosed in mg/24 hours) patch 21 mg  21 mg Transdermal Once Malvin Johns, MD   Stopped at 10/10/16 1323  . ondansetron (ZOFRAN-ODT) disintegrating tablet 4 mg  4 mg Oral Q6H PRN Patrecia Pour, NP      . thiamine (B-1) injection 100 mg  100 mg Intramuscular  Once Patrecia Pour, NP      . thiamine (VITAMIN B-1) tablet 100 mg  100 mg Oral Daily Patrecia Pour, NP   100 mg at 10/11/16 1031   Current Outpatient Prescriptions  Medication Sig Dispense Refill  . gabapentin (NEURONTIN) 300 MG capsule Take 1 capsule (300 mg total) by mouth 3 (three) times daily. (Patient not taking: Reported on 10/10/2016) 90 capsule 0  . hydrOXYzine (ATARAX/VISTARIL) 25 MG tablet Take 1 tablet (25 mg total) by mouth 3 (three) times daily as needed for  anxiety. (Patient not taking: Reported on 10/10/2016) 30 tablet 0  . naltrexone (DEPADE) 50 MG tablet Take 1 tablet (50 mg total) by mouth daily. For alcoholism (Patient not taking: Reported on 10/10/2016) 30 tablet 0  . traZODone (DESYREL) 50 MG tablet Take 1 tablet (50 mg total) by mouth at bedtime. (Patient not taking: Reported on 10/10/2016) 30 tablet 0    Musculoskeletal: Strength & Muscle Tone: within normal limits Gait & Station: normal Patient leans: N/A  Psychiatric Specialty Exam: Physical Exam  Constitutional: He is oriented to person, place, and time. He appears well-developed.  HENT:  Head: Normocephalic.  Respiratory: Effort normal.  Musculoskeletal: Normal range of motion.  Neurological: He is alert and oriented to person, place, and time.  Psychiatric: His speech is normal. Thought content normal. He is agitated. Cognition and memory are normal. He expresses impulsivity. He exhibits a depressed mood.    Review of Systems  Psychiatric/Behavioral: Positive for depression and substance abuse. Negative for hallucinations, memory loss and suicidal ideas. The patient is not nervous/anxious and does not have insomnia.   All other systems reviewed and are negative.   Blood pressure 114/87, pulse 70, temperature 98.3 F (36.8 C), temperature source Oral, resp. rate 16, height 6' 1"  (1.854 m), weight 61.2 kg (135 lb), SpO2 98 %.Body mass index is 17.81 kg/m.  General Appearance: Disheveled  Eye Contact:  Fair  Speech:  Clear and Coherent  Volume:  Normal  Mood:  Depressed and Irritable  Affect:  Congruent and Depressed  Thought Process:  Coherent and Linear  Orientation:  Full (Time, Place, and Person)  Thought Content:  Illogical  Suicidal Thoughts:  No  Homicidal Thoughts:  No  Memory:  Immediate;   Good Recent;   Fair Remote;   Fair  Judgement:  Poor  Insight:  Lacking  Psychomotor Activity:  Normal  Concentration:  Concentration: Good and Attention Span: Fair   Recall:  Good  Fund of Knowledge:  Good  Language:  Good  Akathisia:  No  Handed:  Right  AIMS (if indicated):     Assets:  Agricultural consultant Resilience  ADL's:  Intact  Cognition:  WNL  Sleep:        Treatment Plan Summary: Plan Alcohol dependence with  alcohol-induced mood disorder  Discharge Home Follow up with [facility] and Drug services for substance abuse treatment Avoid the use of alcohol and illicit drugs  Disposition: No evidence of imminent risk to self or others at present.   Patient does not meet criteria for psychiatric inpatient admission. Supportive therapy provided about ongoing stressors. Discussed crisis plan, support from social network, calling 911, coming to the Emergency Department, and calling Suicide Hotline.  Ethelene Hal, NP 10/11/2016 12:21 PM  Patient seen face-to-face for psychiatric evaluation, chart reviewed and case discussed with the physician extender and developed treatment plan. Reviewed the information documented and agree with the treatment plan. Corena Pilgrim, MD

## 2016-10-16 ENCOUNTER — Emergency Department (HOSPITAL_COMMUNITY)
Admission: EM | Admit: 2016-10-16 | Discharge: 2016-10-17 | Disposition: A | Discharge status: Home / Self Care | Payer: Self-pay | Attending: Emergency Medicine | Admitting: Emergency Medicine

## 2016-10-16 ENCOUNTER — Encounter (HOSPITAL_COMMUNITY): Payer: Self-pay | Admitting: Emergency Medicine

## 2016-10-16 DIAGNOSIS — F17210 Nicotine dependence, cigarettes, uncomplicated: Secondary | ICD-10-CM | POA: Insufficient documentation

## 2016-10-16 DIAGNOSIS — F1020 Alcohol dependence, uncomplicated: Secondary | ICD-10-CM | POA: Diagnosis present

## 2016-10-16 DIAGNOSIS — F1010 Alcohol abuse, uncomplicated: Secondary | ICD-10-CM

## 2016-10-16 DIAGNOSIS — R45851 Suicidal ideations: Secondary | ICD-10-CM | POA: Insufficient documentation

## 2016-10-16 DIAGNOSIS — Z046 Encounter for general psychiatric examination, requested by authority: Secondary | ICD-10-CM | POA: Insufficient documentation

## 2016-10-16 DIAGNOSIS — Z79899 Other long term (current) drug therapy: Secondary | ICD-10-CM | POA: Insufficient documentation

## 2016-10-16 DIAGNOSIS — F1024 Alcohol dependence with alcohol-induced mood disorder: Secondary | ICD-10-CM | POA: Diagnosis present

## 2016-10-16 LAB — RAPID URINE DRUG SCREEN, HOSP PERFORMED
Amphetamines: NOT DETECTED
BARBITURATES: NOT DETECTED
BENZODIAZEPINES: NOT DETECTED
COCAINE: NOT DETECTED
OPIATES: POSITIVE — AB
Tetrahydrocannabinol: POSITIVE — AB

## 2016-10-16 LAB — SALICYLATE LEVEL

## 2016-10-16 LAB — CBC
HEMATOCRIT: 43 % (ref 39.0–52.0)
Hemoglobin: 15.4 g/dL (ref 13.0–17.0)
MCH: 31 pg (ref 26.0–34.0)
MCHC: 35.8 g/dL (ref 30.0–36.0)
MCV: 86.5 fL (ref 78.0–100.0)
PLATELETS: 296 10*3/uL (ref 150–400)
RBC: 4.97 MIL/uL (ref 4.22–5.81)
RDW: 13.3 % (ref 11.5–15.5)
WBC: 7.7 10*3/uL (ref 4.0–10.5)

## 2016-10-16 LAB — COMPREHENSIVE METABOLIC PANEL
ALBUMIN: 4.4 g/dL (ref 3.5–5.0)
ALT: 290 U/L — AB (ref 17–63)
AST: 325 U/L — AB (ref 15–41)
Alkaline Phosphatase: 95 U/L (ref 38–126)
Anion gap: 12 (ref 5–15)
BUN: 6 mg/dL (ref 6–20)
CALCIUM: 8.6 mg/dL — AB (ref 8.9–10.3)
CHLORIDE: 102 mmol/L (ref 101–111)
CO2: 26 mmol/L (ref 22–32)
Creatinine, Ser: 0.66 mg/dL (ref 0.61–1.24)
GFR calc Af Amer: >60 mL/min (ref >60)
GFR calc non Af Amer: >60 mL/min (ref >60)
GLUCOSE: 89 mg/dL (ref 65–99)
POTASSIUM: 4.2 mmol/L (ref 3.5–5.1)
SODIUM: 140 mmol/L (ref 135–145)
TOTAL PROTEIN: 7.8 g/dL (ref 6.5–8.1)
Total Bilirubin: 0.9 mg/dL (ref 0.3–1.2)

## 2016-10-16 LAB — ACETAMINOPHEN LEVEL

## 2016-10-16 LAB — ETHANOL: ALCOHOL ETHYL (B): 316 mg/dL — AB (ref <10)

## 2016-10-16 MED ORDER — VITAMIN B-1 100 MG PO TABS
100.0000 mg | ORAL_TABLET | Freq: Every day | ORAL | Status: DC
  Administered 2016-10-16 – 2016-10-17 (×2): 100 mg via ORAL
  Filled 2016-10-16 (×2): qty 1

## 2016-10-16 MED ORDER — THIAMINE HCL 100 MG/ML IJ SOLN: 100.0000 mg | Freq: Every day | INTRAMUSCULAR | Status: DC

## 2016-10-16 MED ORDER — LORAZEPAM 1 MG PO TABS
0.0000 mg | ORAL_TABLET | Freq: Two times a day (BID) | ORAL | Status: DC
Start: 2016-10-19 — End: 2016-10-17
  Administered 2016-10-17: 1 mg via ORAL

## 2016-10-16 MED ORDER — LORAZEPAM 2 MG/ML IJ SOLN
0.0000 mg | Freq: Four times a day (QID) | INTRAMUSCULAR | Status: DC
Start: 2016-10-16 — End: 2016-10-17

## 2016-10-16 MED ORDER — ALUM & MAG HYDROXIDE-SIMETH 200-200-20 MG/5ML PO SUSP: 30.0000 mL | Freq: Four times a day (QID) | ORAL | Status: DC | PRN

## 2016-10-16 MED ORDER — CHLORDIAZEPOXIDE HCL 25 MG PO CAPS
100.0000 mg | ORAL_CAPSULE | Freq: Once | ORAL | Status: AC
  Administered 2016-10-16: 100 mg via ORAL
  Filled 2016-10-16: qty 4

## 2016-10-16 MED ORDER — NICOTINE 21 MG/24HR TD PT24
21.0000 mg | MEDICATED_PATCH | Freq: Every day | TRANSDERMAL | Status: DC
  Administered 2016-10-16: 21 mg via TRANSDERMAL
  Filled 2016-10-16: qty 1

## 2016-10-16 MED ORDER — LORAZEPAM 1 MG PO TABS
0.0000 mg | ORAL_TABLET | Freq: Four times a day (QID) | ORAL | Status: DC
Start: 2016-10-16 — End: 2016-10-17
  Administered 2016-10-16 (×2): 1 mg via ORAL
  Filled 2016-10-16 (×3): qty 1

## 2016-10-16 MED ORDER — LORAZEPAM 2 MG/ML IJ SOLN: 0.0000 mg | Freq: Two times a day (BID) | INTRAMUSCULAR | Status: DC

## 2016-10-16 MED ORDER — ONDANSETRON HCL 4 MG PO TABS: 4.0000 mg | ORAL_TABLET | Freq: Three times a day (TID) | ORAL | Status: DC | PRN

## 2016-10-16 NOTE — ED Notes (Signed)
Bed: [facility] Expected date:  Expected time:  Means of arrival:  Comments: 

## 2016-10-16 NOTE — ED Notes (Signed)
Bed: [facility] Expected date:  Expected time:  Means of arrival:  Comments: Tri2

## 2016-10-16 NOTE — ED Notes (Signed)
Pt admitted to room #41. Pt smells of alcohol. Pt pleasant on approach, cooperative. Pt endorsing SI. Pt endorsing AH. Pt reports he was just d/c from hospital on Saturday and is currently homeless. Encouragement and support provided. CIWA protocol in place. Special checks q 15 mins in place for safety, Video monitoring in place. Will continue to monitor.

## 2016-10-16 NOTE — ED Notes (Signed)
Pt rewanded by Security.  

## 2016-10-16 NOTE — ED Notes (Signed)
Critical lab value received from lab. BAL 316 this nurse notified EDP Dr.Floyd., no new orders given
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
## 2016-10-16 NOTE — ED Triage Notes (Signed)
Pt verbalizes here "for hearing voices and having thoughts of hurting myself." Pt verbalizes ETOH use until 1300 today. Pt alert and oriented x4 with steady gait.

## 2016-10-16 NOTE — ED Provider Notes (Signed)
[facility]-EMER[facility]ENCY DEPT Provider Note   CSN: [id number] Arrival date & time: 10/16/16  1552     History   Chief Complaint Chief Complaint  Patient presents with  . Alcohol Intoxication  . Suicidal    HPI Jake [name] is a 46 y.o. male.  46 yo M with a chief complaint of suicidal ideation. Patient states he is hearing voices telling him that he needs to kill himself. States they told him jump in front of traffic on [address]. Also is a heavy drinker. Patient was just here a week ago.  Doesn't feel that the voices have changed.  Feels that he was having these dispite his etoh use.    The history is provided by the patient.  Alcohol Intoxication  This is a new problem. The current episode started 2 days ago. The problem occurs constantly. The problem has not changed since onset.Pertinent negatives include no chest pain, no abdominal pain, no headaches and no shortness of breath. Nothing aggravates the symptoms. Nothing relieves the symptoms. He has tried nothing for the symptoms. The treatment provided no relief.    Past Medical History:  Diagnosis Date  . Anxiety   . Depression   . Elevated liver enzymes   . ETOH abuse   . Hip dislocation   . Seizures (HCC)    ETOH withdrawl    Patient Active Problem List   Diagnosis Date Noted  . Severe major depression (HCC) 09/27/2016  . MDD (major depressive disorder), recurrent severe, without psychosis (HCC) 08/27/2016  . Acute bronchitis 06/03/2016  . Homeless 06/03/2016  . Alcohol withdrawal (HCC) 06/02/2016  . Tobacco abuse 06/02/2016  . Protein-calorie malnutrition, severe (HCC) 07/10/2015  . Transaminitis 07/07/2015  . Hypokalemia 07/07/2015  . Alcohol withdrawal delirium, acute, mixed level of activity (HCC) 07/07/2015  . Severe recurrent major depression without psychotic features (HCC) 04/10/2015  . Alcohol use with alcohol-induced mood disorder (HCC) 04/06/2015  . Alcohol dependence with alcohol-induced mood  disorder (HCC) 03/09/2014  . Polysubstance abuse (HCC) 03/09/2014  . Opioid dependence (HCC) 03/09/2014  . Alcohol use disorder, severe, dependence (HCC) 03/08/2014  . Substance induced mood disorder (HCC) 03/08/2014  . Alcohol abuse 06/28/2013  . Alcohol withdrawal syndrome without complication (HCC) 06/28/2013  . DTs (delirium tremens) (HCC) 06/28/2013  . Marijuana abuse 04/28/2011  . Alcoholism (HCC) 04/27/2011  . Toxic encephalopathy 04/27/2011  . Psychosis (HCC) 04/27/2011  . Leukocytosis 04/27/2011  . Hyponatremia 04/27/2011  . Thrombocytopenia (HCC) 04/27/2011  . Elevated AST (SGOT) 04/27/2011  . Elevated bilirubin 04/27/2011    Past Surgical History:  Procedure Laterality Date  . FACIAL FRACTURE SURGERY         Home Medications    Prior to Admission medications   Medication Sig Start Date End Date Taking? Authorizing Provider  traZODone (DESYREL) 50 MG tablet Take 1 tablet (50 mg total) by mouth at bedtime. 10/02/16  Yes Money, Gerlene Burdock, FNP  gabapentin (NEURONTIN) 300 MG capsule Take 1 capsule (300 mg total) by mouth 3 (three) times daily. Patient not taking: Reported on 10/10/2016 10/02/16   Money, Gerlene Burdock, FNP  hydrOXYzine (ATARAX/VISTARIL) 25 MG tablet Take 1 tablet (25 mg total) by mouth 3 (three) times daily as needed for anxiety. Patient not taking: Reported on 10/10/2016 10/02/16   Money, Gerlene Burdock, FNP  naltrexone (DEPADE) 50 MG tablet Take 1 tablet (50 mg total) by mouth daily. For alcoholism Patient not taking: Reported on 10/10/2016 10/03/16   Money, Gerlene Burdock, FNP  Family History Family History  Problem Relation Age of Onset  . Heart attack Father     Social History Social History  Substance Use Topics  . Smoking status: Current Every Day Smoker    Packs/day: 2.00    Types: Cigarettes  . Smokeless tobacco: Never Used  . Alcohol use 4.8 oz/week    3 Cans of beer, 5 Shots of liquor per week     Comment: daily     Allergies   Patient has no  known allergies.   Review of Systems Review of Systems  Constitutional: Negative for chills and fever.  HENT: Negative for congestion and facial swelling.   Eyes: Negative for discharge and visual disturbance.  Respiratory: Negative for shortness of breath.   Cardiovascular: Negative for chest pain and palpitations.  Gastrointestinal: Negative for abdominal pain, diarrhea and vomiting.  Musculoskeletal: Negative for arthralgias and myalgias.  Skin: Negative for color change and rash.  Neurological: Negative for tremors, syncope and headaches.  Psychiatric/Behavioral: Positive for hallucinations and suicidal ideas. Negative for confusion and dysphoric mood.     Physical Exam Updated Vital Signs BP 112/75 (BP Location: Left Arm)   Pulse 72   Temp 97.8 F (36.6 C) (Oral)   Resp 18   SpO2 99%   Physical Exam  Constitutional: He is oriented to person, place, and time. He appears well-developed and well-nourished.  HENT:  Head: Normocephalic and atraumatic.  Eyes: Pupils are equal, round, and reactive to light. EOM are normal.  Neck: Normal range of motion. Neck supple. No JVD present.  Cardiovascular: Normal rate and regular rhythm.  Exam reveals no gallop and no friction rub.   No murmur heard. Pulmonary/Chest: No respiratory distress. He has no wheezes.  Abdominal: He exhibits no distension and no mass. There is no tenderness. There is no rebound and no guarding.  Musculoskeletal: Normal range of motion.  Neurological: He is alert and oriented to person, place, and time.  Skin: No rash noted. No pallor.  Psychiatric: He has a normal mood and affect. His behavior is normal.  Nursing note and vitals reviewed.    ED Treatments / Results  Labs (all labs ordered are listed, but only abnormal results are displayed) Labs Reviewed  COMPREHENSIVE METABOLIC PANEL - Abnormal; Notable for the following:       Result Value   Calcium 8.6 (*)    AST 325 (*)    ALT 290 (*)    All  other components within normal limits  ETHANOL - Abnormal; Notable for the following:    Alcohol, Ethyl (B) 316 (*)    All other components within normal limits  ACETAMINOPHEN LEVEL - Abnormal; Notable for the following:    Acetaminophen (Tylenol), Serum <10 (*)    All other components within normal limits  RAPID URINE DRUG SCREEN, HOSP PERFORMED - Abnormal; Notable for the following:    Opiates POSITIVE (*)    Tetrahydrocannabinol POSITIVE (*)    All other components within normal limits  SALICYLATE LEVEL  CBC    EKG  EKG Interpretation None       Radiology No results found.  Procedures Procedures (including critical care time)  Medications Ordered in ED Medications  LORazepam (ATIVAN) injection 0-4 mg ( Intravenous See Alternative 10/16/16 1804)    Or  LORazepam (ATIVAN) tablet 0-4 mg (1 mg Oral Given 10/16/16 1804)  LORazepam (ATIVAN) injection 0-4 mg (not administered)    Or  LORazepam (ATIVAN) tablet 0-4 mg (not administered)  thiamine (VITAMIN  B-1) tablet 100 mg (100 mg Oral Given 10/16/16 1804)    Or  thiamine (B-1) injection 100 mg ( Intravenous See Alternative 10/16/16 1804)  ondansetron (ZOFRAN) tablet 4 mg (not administered)  alum & mag hydroxide-simeth (MAALOX/MYLANTA) 200-200-20 MG/5ML suspension 30 mL (not administered)  nicotine (NICODERM CQ - dosed in mg/24 hours) patch 21 mg (21 mg Transdermal Patch Applied 10/16/16 1804)  chlordiazePOXIDE (LIBRIUM) capsule 100 mg (100 mg Oral Given 10/16/16 1816)     Initial Impression / Assessment and Plan / ED Course  I have reviewed the triage vital signs and the nursing notes.  Pertinent labs & imaging results that were available during my care of the patient were reviewed by me and considered in my medical decision making (see chart for details).     46 yo M with a cc of SI.  Having hallucinations telling him to do the same.  Will have TTS eval.  Feel medically clear at this time.   The patients results and plan  were reviewed and discussed.   Any x-rays performed were independently reviewed by myself.   Differential diagnosis were considered with the presenting HPI.  Medications  LORazepam (ATIVAN) injection 0-4 mg ( Intravenous See Alternative 10/16/16 1804)    Or  LORazepam (ATIVAN) tablet 0-4 mg (1 mg Oral Given 10/16/16 1804)  LORazepam (ATIVAN) injection 0-4 mg (not administered)    Or  LORazepam (ATIVAN) tablet 0-4 mg (not administered)  thiamine (VITAMIN B-1) tablet 100 mg (100 mg Oral Given 10/16/16 1804)    Or  thiamine (B-1) injection 100 mg ( Intravenous See Alternative 10/16/16 1804)  ondansetron (ZOFRAN) tablet 4 mg (not administered)  alum & mag hydroxide-simeth (MAALOX/MYLANTA) 200-200-20 MG/5ML suspension 30 mL (not administered)  nicotine (NICODERM CQ - dosed in mg/24 hours) patch 21 mg (21 mg Transdermal Patch Applied 10/16/16 1804)  chlordiazePOXIDE (LIBRIUM) capsule 100 mg (100 mg Oral Given 10/16/16 1816)    Vitals:   10/16/16 1612 10/16/16 1738  BP: 115/73 112/75  Pulse: 77 72  Resp: 18 18  Temp: 97.8 F (36.6 C)   TempSrc: Oral   SpO2: 99% 99%    Final diagnoses:  Suicidal ideation  Alcohol abuse       Final Clinical Impressions(s) / ED Diagnoses   Final diagnoses:  Suicidal ideation  Alcohol abuse    New Prescriptions New Prescriptions   No medications on file     Melene Plan, DO 10/16/16 2259

## 2016-10-16 NOTE — ED Notes (Signed)
Bed: [facility] Expected date:  Expected time:  Means of arrival:  Comments: 

## 2016-10-16 NOTE — ED Notes (Signed)
Per EDP Adela Lank, Pt is clinically sober and cleared to go to [facility].

## 2016-10-16 NOTE — ED Notes (Signed)
Patient met sleeping. Continues to sleep at this time. Respiration even and unlabored. No signs of distress noted. Will continue to monitor patient.

## 2016-10-17 DIAGNOSIS — F1190 Opioid use, unspecified, uncomplicated: Secondary | ICD-10-CM

## 2016-10-17 DIAGNOSIS — F1290 Cannabis use, unspecified, uncomplicated: Secondary | ICD-10-CM

## 2016-10-17 DIAGNOSIS — F1910 Other psychoactive substance abuse, uncomplicated: Secondary | ICD-10-CM

## 2016-10-17 DIAGNOSIS — F1024 Alcohol dependence with alcohol-induced mood disorder: Secondary | ICD-10-CM

## 2016-10-17 DIAGNOSIS — F1490 Cocaine use, unspecified, uncomplicated: Secondary | ICD-10-CM

## 2016-10-17 DIAGNOSIS — F17210 Nicotine dependence, cigarettes, uncomplicated: Secondary | ICD-10-CM

## 2016-10-17 MED ORDER — GABAPENTIN 100 MG PO CAPS
200.0000 mg | ORAL_CAPSULE | Freq: Two times a day (BID) | ORAL | Status: DC
Start: 2016-10-17 — End: 2016-10-17
  Administered 2016-10-17: 200 mg via ORAL
  Filled 2016-10-17: qty 2

## 2016-10-17 MED ORDER — GABAPENTIN 300 MG PO CAPS: 300.0000 mg | ORAL_CAPSULE | Freq: Three times a day (TID) | ORAL | 0 refills | Status: AC

## 2016-10-17 NOTE — [facility] Suicide Risk Assessment (Signed)
Suicide Risk Assessment  Discharge Assessment   [facility] Discharge Suicide Risk Assessment   Principal Problem: Alcohol dependence with alcohol-induced mood disorder [facility]) Discharge Diagnoses:  Patient Active Problem List   Diagnosis Date Noted  . Alcohol dependence with alcohol-induced mood disorder (HCC) [F10.24] 03/09/2014    Priority: High  . Alcohol use disorder, severe, dependence (HCC) [F10.20] 03/08/2014    Priority: High  . Severe major depression (HCC) [F32.2] 09/27/2016  . MDD (major depressive disorder), recurrent severe, without psychosis (HCC) [F33.2] 08/27/2016  . Acute bronchitis [J20.9] 06/03/2016  . Homeless [Z59.0] 06/03/2016  . Alcohol withdrawal (HCC) [F10.239] 06/02/2016  . Tobacco abuse [Z72.0] 06/02/2016  . Protein-calorie malnutrition, severe (HCC) [E43] 07/10/2015  . Transaminitis [R74.0] 07/07/2015  . Hypokalemia [E87.6] 07/07/2015  . Alcohol withdrawal delirium, acute, mixed level of activity (HCC) [F10.231] 07/07/2015  . 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 Alcohol use with alcohol-induced mood disorder (HCC) [F10.94] 04/06/2015  . Polysubstance abuse (HCC) [F19.10] 03/09/2014  . Opioid dependence (HCC) [F11.20] 03/09/2014  . Substance induced mood disorder (HCC) [F19.94] 03/08/2014  . Alcohol abuse [F10.10] 06/28/2013  . Alcohol withdrawal syndrome without complication (HCC) [F10.230] 06/28/2013  . DTs (delirium tremens) (HCC) [F10.231] 06/28/2013  . Marijuana abuse [F12.10] 04/28/2011  . Alcoholism (HCC) [F10.20] 04/27/2011  . Toxic encephalopathy [G92] 04/27/2011  . Psychosis (HCC) [F29] 04/27/2011  . Leukocytosis [D72.829] 04/27/2011  . Hyponatremia [E87.1] 04/27/2011  . Thrombocytopenia (HCC) [D69.6] 04/27/2011  . Elevated AST (SGOT) [R74.0] 04/27/2011  . Elevated bilirubin [R17] 04/27/2011    Total Time spent with patient: 45 minutes   Musculoskeletal: Strength & Muscle Tone: within normal  limits Gait & Station: normal Patient leans: N/A  Psychiatric Specialty Exam: Physical Exam  Constitutional: He is oriented to person, place, and time. He appears well-developed and well-nourished.  HENT:  Head: Normocephalic.  Neck: Normal range of motion.  Respiratory: Effort normal.  Musculoskeletal: Normal range of motion.  Neurological: He is alert and oriented to person, place, and time.  Psychiatric: He has a normal mood and affect. His speech is normal and behavior is normal. Judgment and thought content normal. Cognition and memory are normal.    Review of Systems  Psychiatric/Behavioral: Positive for substance abuse.  All other systems reviewed and are negative.   Blood pressure 110/81, pulse 87, temperature 98.2 F (36.8 C), temperature source Oral, resp. rate 18, SpO2 98 %.There is no height or weight on file to calculate BMI.  General Appearance: Casual  Eye Contact:  Good  Speech:  Normal Rate  Volume:  Normal  Mood:  Euthymic  Affect:  Congruent  Thought Process:  Coherent and Descriptions of Associations: Intact  Orientation:  Full (Time, Place, and Person)  Thought Content:  WDL and Logical  Suicidal Thoughts:  No  Homicidal Thoughts:  No  Memory:  Immediate;   Good Recent;   Good Remote;   Good  Judgement:  Fair  Insight:  Fair  Psychomotor Activity:  Normal  Concentration:  Concentration: Good and Attention Span: Good  Recall:  Good  Fund of Knowledge:  Fair  Language:  Good  Akathisia:  No  Handed:  Right  AIMS (if indicated):     Assets:  Leisure Time Physical Health Resilience Social Support  ADL's:  Intact  Cognition:  WNL  Sleep:       Mental Status Per Nursing Assessment::   On Admission:   alcohol intoxication  Demographic Factors:  Male and Caucasian  Loss Factors: NA  Historical Factors: NA  Risk Reduction Factors:   Sense of responsibility to family and Positive social support  Continued Clinical Symptoms:   NOne  Cognitive Features That Contribute To Risk:  None    Suicide Risk:  Minimal: No identifiable suicidal ideation.  Patients presenting with no risk factors but with morbid ruminations; may be classified as minimal risk based on the severity of the depressive symptoms    Plan Of Care/Follow-up recommendations:  Activity:  as tolerated Diet:  heart healthy diet  Almer Littleton, NP 10/17/2016, 11:10 AM

## 2016-10-17 NOTE — Discharge Instructions (Signed)
To help you maintain a sober lifestyle, a substance abuse treatment program may be beneficial to you.  Contact one of the following facilities at your earliest opportunity to ask about enrolling: ° °RESIDENTIAL PROGRAMS: ° °     [facility] °     [address] °     [place], [place] [address] °     [phone] ° °     [facility] °     [address] °     [place], [place] [address] °     [phone] ° °     [facility] °     [address] °     [place], [place] [address] °     [phone] ° °OUTPATIENT PROGRAMS: ° °     [facility] °     [address] °     [place], [place] [address] °     [phone] °     New patients are seen at the walk-in clinic every Tuesday from 9:00 am - 12:00 pm °

## 2016-10-17 NOTE — ED Notes (Signed)
Pt discharged home. Discharged instructions read to pt who verbalized understanding. All belongings returned to pt who signed for same. Denies SI/HI, is not delusional and not responding to internal stimuli. Escorted pt to the ED exit.    

## 2016-10-17 NOTE — Patient Outreach (Signed)
ED Peer Support Specialist Patient Intake (Complete at intake & 30-60 Day Follow-up)  Name: Jake [name]  MRN: [id number]  Age: 46 y.o.   Date of Admission: 10/17/2016  Intake: 30 Day Comments:   Follow up with Pt to make sure that he is attending the AA groups   Primary Reason Admitted: "alcohol withrawal." Pt reported, he was suicidal with a plan to walk onto traffic on [address]. Pt reported, hearing voices telling him to kill himself for the past couple of months. Clinician asked the pt, if he had previous suicide attempts. Pt replied, "too many." Pt reported, previous history of cutting but not currently. Pt reported, access to guns and knifes. Pt denies, HI  Lab values: Alcohol/ETOH: Positive Positive UDS? No Amphetamines: No Barbiturates: No Benzodiazepines: No Cocaine: No Opiates: No Cannabinoids: No  Demographic information: Gender: Male Ethnicity: White Marital Status: Single Insurance Status: Uninsured/Self-pay Control and instrumentation engineer (Work Engineer, agricultural, Sales executive, etc.: No Lives with: Alone Living situation: House/Apartment  Reported Patient History: Patient reported health conditions: Depression Patient aware of HIV and hepatitis status: No  In past year, has patient visited ED for any reason? Yes  Number of ED visits: 1  Reason(s) for visit: Alcohol induced mood disorder  In past year, has patient been hospitalized for any reason? Yes (Alcohol induced mood disorder)  Number of hospitalizations: 2  Reason(s) for hospitalization:    In past year, has patient been arrested? No  Number of arrests:    Reason(s) for arrest:    In past year, has patient been incarcerated? No  Number of incarcerations:    Reason(s) for incarceration:    In past year, has patient received medication-assisted treatment? No  In past year, patient received the following treatments: Individual therapy, Group therapy  In past year, has patient  received any harm reduction services? No  Did this include any of the following?    In past year, has patient received care from a mental health provider for diagnosis other than SUD? No  In past year, is this first time patient has overdosed? No  Number of past overdoses: 0  In past year, is this first time patient has been hospitalized for an overdose? No  Number of hospitalizations for overdose(s): 0  Is patient currently receiving treatment for a mental health diagnosis? No  Patient reports experiencing difficulty participating in SUD treatment: No    Most important reason(s) for this difficulty?    Has patient received prior services for treatment? Unsure  In past, patient has received services from following agencies:    Plan of Care:  Suggested follow up at these agencies/treatment centers: Individual therapy  Other information: Pt stated that he cannot attend any treatment facility because he has to work. [facility] encouraged Pt to continue to attending AA groups.    Arlys John Ray Gervasi, CPSS  10/17/2016 11:42 AM

## 2016-10-17 NOTE — Consult Note (Signed)
[facility] Consult   Reason for Consult:  Alcohol intoxication Referring Physician:  EDP Patient Identification: Jake [name] MRN:  [id number] Principal Diagnosis: Alcohol dependence with alcohol-induced mood disorder [facility]) Diagnosis:   Patient Active Problem List   Diagnosis Date Noted  . Alcohol dependence with alcohol-induced mood disorder ([place]) [F10.24] 03/09/2014    Priority: High  . Alcohol use disorder, severe, dependence ([place]) [F10.20] 03/08/2014    Priority: High  . Severe major depression ([place]) [F32.2] 09/27/2016  . MDD (major depressive disorder), recurrent severe, without psychosis ([place]) [F33.2] 08/27/2016  . Acute bronchitis [J20.9] 06/03/2016  . Homeless [Z59.0] 06/03/2016  . Alcohol withdrawal ([place]) [F10.239] 06/02/2016  . Tobacco abuse [Z72.0] 06/02/2016  . Protein-calorie malnutrition, severe ([place]) [E43] 07/10/2015  . Transaminitis [R74.0] 07/07/2015  . Hypokalemia [E87.6] 07/07/2015  . Alcohol withdrawal delirium, acute, mixed level of activity ([place]) [F10.231] 07/07/2015  . Severe recurrent major depression without psychotic features ([place]) [F33.2] 04/10/2015  . Alcohol use with alcohol-induced mood disorder ([place]) [F10.94] 04/06/2015  . Polysubstance abuse ([place]) [F19.10] 03/09/2014  . Opioid dependence ([place]) [F11.20] 03/09/2014  . Substance induced mood disorder ([place]) [F19.94] 03/08/2014  . Alcohol abuse [F10.10] 06/28/2013  . Alcohol withdrawal syndrome without complication ([place]) [V25.366] 06/28/2013  . DTs (delirium tremens) ([place]) [F10.231] 06/28/2013  . Marijuana abuse [F12.10] 04/28/2011  . Alcoholism ([place]) [F10.20] 04/27/2011  . Toxic encephalopathy [G92] 04/27/2011  . Psychosis ([place]) [F29] 04/27/2011  . Leukocytosis [D72.829] 04/27/2011  . Hyponatremia [E87.1] 04/27/2011  . Thrombocytopenia ([place]) [D69.6] 04/27/2011  . Elevated AST (SGOT) [R74.0] 04/27/2011  . Elevated bilirubin [R17] 04/27/2011    Total Time spent with patient: 45  minutes  Subjective:   Jake [name] is a 46 y.o. male patient does not warrant admission.  HPI:  46 yo male who presented to the ED with alcohol intoxication.  Denies suicidal/homicidal ideations, hallucinations, or withdrawal symptoms.  He was recently at [facility] but did not follow-up with any outpatient resources.  Maynard stated he had to work and could not make AA or other appointments.  Peer support referral in place.  Past Psychiatric History: substance abuse  Risk to Self: NOne Risk to Others: NOne Prior Inpatient Therapy: Prior Inpatient Therapy: Yes (Per chart. ) Prior Therapy Dates: Per chart, 2018, 2017, and 2016. Prior Therapy Facilty/Provider(s): Per chart, [facility].  Reason for Treatment: Per chart, SA, ALCOHOL ABUSE  Prior Outpatient Therapy: Prior Outpatient Therapy: No Prior Therapy Dates: NA Prior Therapy Facilty/Provider(s): NA Reason for Treatment: NA Does patient have an ACCT team?: No Does patient have Intensive In-House Services?  : No Does patient have [facility] services? : No Does patient have P4CC services?: No  Past Medical History:  Past Medical History:  Diagnosis Date  . Anxiety   . Depression   . Elevated liver enzymes   . ETOH abuse   . Hip dislocation   . Seizures ([place])    ETOH withdrawl    Past Surgical History:  Procedure Laterality Date  . FACIAL FRACTURE SURGERY     Family History:  Family History  Problem Relation Age of Onset  . Heart attack Father    Family Psychiatric  History: substance abuse Social History:  History  Alcohol Use  . 4.8 oz/week  . 3 Cans of beer, 5 Shots of liquor per week    Comment: daily     History  Drug Use  . Types: Marijuana, Cocaine, IV, Heroin, Oxycodone, "Crack" cocaine, Methamphetamines    Comment: recent heroine OD  on 09/25/16 per pt, and molly    Social History   Social History  . Marital status: Single    Spouse name: N/A  . Number of children: N/A  . Years of education: N/A   Social  History Main Topics  . Smoking status: Current Every Day Smoker    Packs/day: 2.00    Types: Cigarettes  . Smokeless tobacco: Never Used  . Alcohol use 4.8 oz/week    3 Cans of beer, 5 Shots of liquor per week     Comment: daily  . Drug use: Yes    Types: Marijuana, Cocaine, IV, Heroin, Oxycodone, "Crack" cocaine, Methamphetamines     Comment: recent heroine OD on 09/25/16 per pt, and molly  . Sexual activity: Yes   Other Topics Concern  . None   Social History Narrative  . None   Additional Social History:    Allergies:  No Known Allergies  Labs:  Results for orders placed or performed during the hospital encounter of 10/16/16 (from the past 48 hour(s))  Comprehensive metabolic panel     Status: Abnormal   Collection Time: 10/16/16  5:00 PM  Result Value Ref Range   Sodium 140 135 - 145 mmol/L   Potassium 4.2 3.5 - 5.1 mmol/L   Chloride 102 101 - 111 mmol/L   CO2 26 22 - 32 mmol/L   Glucose, Bld 89 65 - 99 mg/dL   BUN 6 6 - 20 mg/dL   Creatinine, Ser 0.66 0.61 - 1.24 mg/dL   Calcium 8.6 (L) 8.9 - 10.3 mg/dL   Total Protein 7.8 6.5 - 8.1 g/dL   Albumin 4.4 3.5 - 5.0 g/dL   AST 325 (H) 15 - 41 U/L   ALT 290 (H) 17 - 63 U/L   Alkaline Phosphatase 95 38 - 126 U/L   Total Bilirubin 0.9 0.3 - 1.2 mg/dL   GFR calc non Af Amer >60 >60 mL/min   GFR calc Af Amer >60 >60 mL/min    Comment: (NOTE) The eGFR has been calculated using the CKD EPI equation. This calculation has not been validated in all clinical situations. eGFR's persistently <60 mL/min signify possible Chronic Kidney Disease.    Anion gap 12 5 - 15  Ethanol     Status: Abnormal   Collection Time: 10/16/16  5:00 PM  Result Value Ref Range   Alcohol, Ethyl (B) 316 (HH) <10 mg/dL    Comment:        LOWEST DETECTABLE LIMIT FOR SERUM ALCOHOL IS 10 mg/dL FOR MEDICAL PURPOSES ONLY Please note change in reference range. CRITICAL RESULT CALLED TO, READ BACK BY AND VERIFIED WITH: STRADER,A AT 1801 ON 10/16/2016  BY MOSLEY,J   Salicylate level     Status: None   Collection Time: 10/16/16  5:00 PM  Result Value Ref Range   Salicylate Lvl <5.4 2.8 - 30.0 mg/dL  Acetaminophen level     Status: Abnormal   Collection Time: 10/16/16  5:00 PM  Result Value Ref Range   Acetaminophen (Tylenol), Serum <10 (L) 10 - 30 ug/mL    Comment:        THERAPEUTIC CONCENTRATIONS VARY SIGNIFICANTLY. A RANGE OF 10-30 ug/mL MAY BE AN EFFECTIVE CONCENTRATION FOR MANY PATIENTS. HOWEVER, SOME ARE BEST TREATED AT CONCENTRATIONS OUTSIDE THIS RANGE. ACETAMINOPHEN CONCENTRATIONS >150 ug/mL AT 4 HOURS AFTER INGESTION AND >50 ug/mL AT 12 HOURS AFTER INGESTION ARE OFTEN ASSOCIATED WITH TOXIC REACTIONS.   cbc     Status: None   Collection  Time: 10/16/16  5:00 PM  Result Value Ref Range   WBC 7.7 4.0 - 10.5 K/uL   RBC 4.97 4.22 - 5.81 MIL/uL   Hemoglobin 15.4 13.0 - 17.0 g/dL   HCT 43.0 39.0 - 52.0 %   MCV 86.5 78.0 - 100.0 fL   MCH 31.0 26.0 - 34.0 pg   MCHC 35.8 30.0 - 36.0 g/dL   RDW 13.3 11.5 - 15.5 %   Platelets 296 150 - 400 K/uL  Rapid urine drug screen (hospital performed)     Status: Abnormal   Collection Time: 10/16/16  5:02 PM  Result Value Ref Range   Opiates POSITIVE (A) NONE DETECTED   Cocaine NONE DETECTED NONE DETECTED   Benzodiazepines NONE DETECTED NONE DETECTED   Amphetamines NONE DETECTED NONE DETECTED   Tetrahydrocannabinol POSITIVE (A) NONE DETECTED   Barbiturates NONE DETECTED NONE DETECTED    Comment:        DRUG SCREEN FOR MEDICAL PURPOSES ONLY.  IF CONFIRMATION IS NEEDED FOR ANY PURPOSE, NOTIFY LAB WITHIN 5 DAYS.        LOWEST DETECTABLE LIMITS FOR URINE DRUG SCREEN Drug Class       Cutoff (ng/mL) Amphetamine      1000 Barbiturate      200 Benzodiazepine   983 Tricyclics       382 Opiates          300 Cocaine          300 THC              50     Current Facility-Administered Medications  Medication Dose Route Frequency Provider Last Rate Last Dose  . alum & mag  hydroxide-simeth (MAALOX/MYLANTA) 200-200-20 MG/5ML suspension 30 mL  30 mL Oral Q6H PRN Deno Etienne, DO      . gabapentin (NEURONTIN) capsule 200 mg  200 mg Oral BID Christophe Rising, MD      . LORazepam (ATIVAN) injection 0-4 mg  0-4 mg Intravenous Q6H Deno Etienne, DO       Or  . LORazepam (ATIVAN) tablet 0-4 mg  0-4 mg Oral Q6H Deno Etienne, DO   1 mg at 10/16/16 2318  . [START ON 10/19/2016] LORazepam (ATIVAN) injection 0-4 mg  0-4 mg Intravenous Q12H Deno Etienne, DO       Or  . Derrill Memo ON 10/19/2016] LORazepam (ATIVAN) tablet 0-4 mg  0-4 mg Oral Q12H Deno Etienne, DO   1 mg at 10/17/16 0544  . nicotine (NICODERM CQ - dosed in mg/24 hours) patch 21 mg  21 mg Transdermal Daily Deno Etienne, DO   21 mg at 10/16/16 1804  . ondansetron (ZOFRAN) tablet 4 mg  4 mg Oral Q8H PRN Deno Etienne, DO      . thiamine (VITAMIN B-1) tablet 100 mg  100 mg Oral Daily Deno Etienne, DO   100 mg at 10/16/16 1804   Or  . thiamine (B-1) injection 100 mg  100 mg Intravenous Daily Deno Etienne, DO       Current Outpatient Prescriptions  Medication Sig Dispense Refill  . traZODone (DESYREL) 50 MG tablet Take 1 tablet (50 mg total) by mouth at bedtime. 30 tablet 0  . gabapentin (NEURONTIN) 300 MG capsule Take 1 capsule (300 mg total) by mouth 3 (three) times daily. (Patient not taking: Reported on 10/10/2016) 90 capsule 0  . hydrOXYzine (ATARAX/VISTARIL) 25 MG tablet Take 1 tablet (25 mg total) by mouth 3 (three) times daily as needed for anxiety. (Patient not taking:  Reported on 10/10/2016) 30 tablet 0  . naltrexone (DEPADE) 50 MG tablet Take 1 tablet (50 mg total) by mouth daily. For alcoholism (Patient not taking: Reported on 10/10/2016) 30 tablet 0    Musculoskeletal: Strength & Muscle Tone: within normal limits Gait & Station: normal Patient leans: N/A  Psychiatric Specialty Exam: Physical Exam  Constitutional: He is oriented to person, place, and time. He appears well-developed and well-nourished.  HENT:  Head:  Normocephalic.  Neck: Normal range of motion.  Respiratory: Effort normal.  Musculoskeletal: Normal range of motion.  Neurological: He is alert and oriented to person, place, and time.  Psychiatric: He has a normal mood and affect. His speech is normal and behavior is normal. Judgment and thought content normal. Cognition and memory are normal.    Review of Systems  Psychiatric/Behavioral: Positive for substance abuse.  All other systems reviewed and are negative.   Blood pressure 110/81, pulse 87, temperature 98.2 F (36.8 C), temperature source Oral, resp. rate 18, SpO2 98 %.There is no height or weight on file to calculate BMI.  General Appearance: Casual  Eye Contact:  Good  Speech:  Normal Rate  Volume:  Normal  Mood:  Euthymic  Affect:  Congruent  Thought Process:  Coherent and Descriptions of Associations: Intact  Orientation:  Full (Time, Place, and Person)  Thought Content:  WDL and Logical  Suicidal Thoughts:  No  Homicidal Thoughts:  No  Memory:  Immediate;   Good Recent;   Good Remote;   Good  Judgement:  Fair  Insight:  Fair  Psychomotor Activity:  Normal  Concentration:  Concentration: Good and Attention Span: Good  Recall:  Good  Fund of Knowledge:  Fair  Language:  Good  Akathisia:  No  Handed:  Right  AIMS (if indicated):     Assets:  Leisure Time Physical Health Resilience Social Support  ADL's:  Intact  Cognition:  WNL  Sleep:        Treatment Plan Summary: Daily contact with patient to assess and evaluate symptoms and progress in treatment, Medication management and Plan alcohol abuse with alcohol induced mood disorder:  -Crisis stabilization -Medication management:  Ativan alcohol detox protocol stared inpatient, gabapentin 300 mg TID given at discharge for any potential alcohol withdrawal issues -Individual and substance abuse counseling -Peer support referral  Disposition: No evidence of imminent risk to self or others at present.     Waylan Boga, NP 10/17/2016 10:58 AM  Patient seen face-to-face for psychiatric evaluation, chart reviewed and case discussed with the physician extender and developed treatment plan. Reviewed the information documented and agree with the treatment plan. Corena Pilgrim, MD

## 2016-10-17 NOTE — [facility] Assessment (Signed)
[facility] Assessment Progress Note  Per Thedore Mins, MD, this pt does not require psychiatric hospitalization at this time.  Pt is to be discharged from [facility] with referral information for area substance abuse treatment providers.  This has been included in pt's discharge instructions.  Pt would also benefit from being seen by Peer Support Specialists; they will be asked to speak with pt.  Pt's nurse, Diane, has been notified.  Doylene Canning, MA Triage Specialist [phone]

## 2016-10-20 ENCOUNTER — Emergency Department (HOSPITAL_COMMUNITY)
Admission: EM | Admit: 2016-10-20 | Discharge: 2016-10-21 | Disposition: A | Discharge status: Home / Self Care | Payer: Self-pay | Attending: Emergency Medicine | Admitting: Emergency Medicine

## 2016-10-20 DIAGNOSIS — F17210 Nicotine dependence, cigarettes, uncomplicated: Secondary | ICD-10-CM | POA: Insufficient documentation

## 2016-10-20 DIAGNOSIS — Z79899 Other long term (current) drug therapy: Secondary | ICD-10-CM | POA: Insufficient documentation

## 2016-10-20 DIAGNOSIS — Z590 Homelessness: Secondary | ICD-10-CM | POA: Insufficient documentation

## 2016-10-20 DIAGNOSIS — F1024 Alcohol dependence with alcohol-induced mood disorder: Secondary | ICD-10-CM | POA: Diagnosis present

## 2016-10-20 DIAGNOSIS — R45851 Suicidal ideations: Secondary | ICD-10-CM

## 2016-10-20 DIAGNOSIS — Y908 Blood alcohol level of 240 mg/100 ml or more: Secondary | ICD-10-CM | POA: Insufficient documentation

## 2016-10-20 DIAGNOSIS — F332 Major depressive disorder, recurrent severe without psychotic features: Secondary | ICD-10-CM

## 2016-10-20 DIAGNOSIS — F419 Anxiety disorder, unspecified: Secondary | ICD-10-CM | POA: Insufficient documentation

## 2016-10-20 DIAGNOSIS — F10929 Alcohol use, unspecified with intoxication, unspecified: Secondary | ICD-10-CM | POA: Insufficient documentation

## 2016-10-20 LAB — CBC WITH DIFFERENTIAL/PLATELET
Basophils Absolute: 0.1 10*3/uL (ref 0.0–0.1)
Basophils Relative: 1 %
Eosinophils Absolute: 0.3 10*3/uL (ref 0.0–0.7)
Eosinophils Relative: 4 %
HCT: 40.9 % (ref 39.0–52.0)
Hemoglobin: 14.2 g/dL (ref 13.0–17.0)
Lymphocytes Relative: 44 %
Lymphs Abs: 3 10*3/uL (ref 0.7–4.0)
MCH: 30.5 pg (ref 26.0–34.0)
MCHC: 34.7 g/dL (ref 30.0–36.0)
MCV: 88 fL (ref 78.0–100.0)
Monocytes Absolute: 0.9 10*3/uL (ref 0.1–1.0)
Monocytes Relative: 13 %
Neutro Abs: 2.6 10*3/uL (ref 1.7–7.7)
Neutrophils Relative %: 38 %
Platelets: 218 10*3/uL (ref 150–400)
RBC: 4.65 MIL/uL (ref 4.22–5.81)
RDW: 13.6 % (ref 11.5–15.5)
WBC: 6.7 10*3/uL (ref 4.0–10.5)

## 2016-10-20 LAB — COMPREHENSIVE METABOLIC PANEL
ALT: 331 U/L — ABNORMAL HIGH (ref 17–63)
AST: 361 U/L — ABNORMAL HIGH (ref 15–41)
Albumin: 4.1 g/dL (ref 3.5–5.0)
Alkaline Phosphatase: 101 U/L (ref 38–126)
Anion gap: 10 (ref 5–15)
BUN: 11 mg/dL (ref 6–20)
CO2: 26 mmol/L (ref 22–32)
Calcium: 8.6 mg/dL — ABNORMAL LOW (ref 8.9–10.3)
Chloride: 107 mmol/L (ref 101–111)
Creatinine, Ser: 0.74 mg/dL (ref 0.61–1.24)
GFR calc Af Amer: >60 mL/min (ref >60)
GFR calc non Af Amer: >60 mL/min (ref >60)
Glucose, Bld: 85 mg/dL (ref 65–99)
Potassium: 3.8 mmol/L (ref 3.5–5.1)
Sodium: 143 mmol/L (ref 135–145)
Total Bilirubin: 0.5 mg/dL (ref 0.3–1.2)
Total Protein: 7.1 g/dL (ref 6.5–8.1)

## 2016-10-20 MED ORDER — LORAZEPAM 1 MG PO TABS
0.0000 mg | ORAL_TABLET | Freq: Four times a day (QID) | ORAL | Status: DC
Start: 2016-10-20 — End: 2016-10-21
  Administered 2016-10-20 – 2016-10-21 (×2): 1 mg via ORAL
  Filled 2016-10-20 (×2): qty 1

## 2016-10-20 MED ORDER — LORAZEPAM 2 MG/ML IJ SOLN: 0.0000 mg | Freq: Two times a day (BID) | INTRAMUSCULAR | Status: DC

## 2016-10-20 MED ORDER — LORAZEPAM 1 MG PO TABS
0.0000 mg | ORAL_TABLET | Freq: Two times a day (BID) | ORAL | Status: DC
Start: 2016-10-23 — End: 2016-10-21

## 2016-10-20 MED ORDER — THIAMINE HCL 100 MG/ML IJ SOLN: 100.0000 mg | Freq: Every day | INTRAMUSCULAR | Status: DC

## 2016-10-20 MED ORDER — VITAMIN B-1 100 MG PO TABS
100.0000 mg | ORAL_TABLET | Freq: Every day | ORAL | Status: DC
  Administered 2016-10-21: 100 mg via ORAL
  Filled 2016-10-20: qty 1

## 2016-10-20 MED ORDER — LORAZEPAM 2 MG/ML IJ SOLN: 0.0000 mg | Freq: Four times a day (QID) | INTRAMUSCULAR | Status: DC
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
## 2016-10-20 NOTE — ED Triage Notes (Signed)
Pt stated "[place].  I need help.  This man gave me his # and he was supposed to help me find a place to help me."  Has the name of Darden Dates [phone] written on a piece of paper.
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
## 2016-10-20 NOTE — ED Notes (Signed)
Bed: ZO10 Expected date:  Expected time:  Means of arrival:  Comments: [facility]

## 2016-10-20 NOTE — ED Notes (Signed)
Patient wanded by security. 

## 2016-10-21 DIAGNOSIS — R4587 Impulsiveness: Secondary | ICD-10-CM

## 2016-10-21 DIAGNOSIS — F1024 Alcohol dependence with alcohol-induced mood disorder: Secondary | ICD-10-CM

## 2016-10-21 DIAGNOSIS — F1910 Other psychoactive substance abuse, uncomplicated: Secondary | ICD-10-CM

## 2016-10-21 DIAGNOSIS — F332 Major depressive disorder, recurrent severe without psychotic features: Secondary | ICD-10-CM

## 2016-10-21 DIAGNOSIS — Z599 Problem related to housing and economic circumstances, unspecified: Secondary | ICD-10-CM

## 2016-10-21 LAB — ETHANOL: Alcohol, Ethyl (B): 302 mg/dL (ref <10)

## 2016-10-21 LAB — SALICYLATE LEVEL: Salicylate Lvl: <7 mg/dL (ref 2.8–30.0)

## 2016-10-21 LAB — ACETAMINOPHEN LEVEL: Acetaminophen (Tylenol), Serum: <10 ug/mL — ABNORMAL LOW (ref 10–30)

## 2016-10-21 MED ORDER — ONDANSETRON 4 MG PO TBDP
4.0000 mg | ORAL_TABLET | Freq: Four times a day (QID) | ORAL | Status: DC | PRN
  Administered 2016-10-21: 4 mg via ORAL
  Filled 2016-10-21: qty 1

## 2016-10-21 MED ORDER — NAPROXEN 500 MG PO TABS: 500.0000 mg | ORAL_TABLET | Freq: Two times a day (BID) | ORAL | Status: DC | PRN

## 2016-10-21 MED ORDER — ONDANSETRON HCL 4 MG PO TABS: 4.0000 mg | ORAL_TABLET | Freq: Three times a day (TID) | ORAL | Status: DC | PRN

## 2016-10-21 MED ORDER — DICYCLOMINE HCL 20 MG PO TABS
20.0000 mg | ORAL_TABLET | Freq: Four times a day (QID) | ORAL | Status: DC | PRN
  Administered 2016-10-21: 20 mg via ORAL
  Filled 2016-10-21: qty 1

## 2016-10-21 MED ORDER — HYDROXYZINE HCL 25 MG PO TABS
25.0000 mg | ORAL_TABLET | Freq: Four times a day (QID) | ORAL | Status: DC | PRN
  Administered 2016-10-21: 25 mg via ORAL
  Filled 2016-10-21: qty 1

## 2016-10-21 MED ORDER — CLONIDINE HCL 0.1 MG PO TABS: 0.1000 mg | ORAL_TABLET | Freq: Every day | ORAL | Status: DC

## 2016-10-21 MED ORDER — CLONIDINE HCL 0.1 MG PO TABS
0.1000 mg | ORAL_TABLET | Freq: Four times a day (QID) | ORAL | Status: DC
  Administered 2016-10-21: 0.1 mg via ORAL
  Filled 2016-10-21: qty 1

## 2016-10-21 MED ORDER — CLONIDINE HCL 0.1 MG PO TABS: 0.1000 mg | ORAL_TABLET | Freq: Two times a day (BID) | ORAL | Status: DC

## 2016-10-21 MED ORDER — METHOCARBAMOL 500 MG PO TABS: 500.0000 mg | ORAL_TABLET | Freq: Three times a day (TID) | ORAL | Status: DC | PRN

## 2016-10-21 MED ORDER — ALUM & MAG HYDROXIDE-SIMETH 200-200-20 MG/5ML PO SUSP: 30.0000 mL | Freq: Four times a day (QID) | ORAL | Status: DC | PRN

## 2016-10-21 MED ORDER — NICOTINE 21 MG/24HR TD PT24
21.0000 mg | MEDICATED_PATCH | Freq: Every day | TRANSDERMAL | Status: DC
  Administered 2016-10-21 (×2): 21 mg via TRANSDERMAL
  Filled 2016-10-21 (×2): qty 1

## 2016-10-21 MED ORDER — LOPERAMIDE HCL 2 MG PO CAPS: 2.0000 mg | ORAL_CAPSULE | ORAL | Status: DC | PRN

## 2016-10-21 NOTE — ED Notes (Signed)
Mina, PA-C informed of pt's BAL of 302.

## 2016-10-21 NOTE — Patient Outreach (Signed)
ED Peer Support Specialist Patient Intake (Complete at intake & 30-60 Day Follow-up)  Name: Jake [name]  MRN: [id number]  Age: 46 y.o.   Date of Admission: 10/21/2016  Intake: 30 Day Comments:      Primary Reason Admitted: Pt sts he is homeless and "I don't know where else to go." Pt was discharged from [facility] on 10/17/16 and sts he went back to his "spot" and sts all his "stuff" was gone. Pt sts he had to find a new "spot." Pt sts he was so upset he "went on a bender" using a large amount of drugs including alcohol. Pt denies HI, SHI and VH. Pt sts he "hears things" all day every day. Pt sts he hears "a song." No further details are given. Pt sts he is not taking any medications currently.    Lab values: Alcohol/ETOH:   Positive UDS?   Amphetamines:   Barbiturates:   Benzodiazepines:   Cocaine:   Opiates:   Cannabinoids:    Demographic information: Gender:   Ethnicity:   Marital Status:   Insurance Status:   Control and instrumentation engineer (Work Engineer, agricultural, Sales executive, etc.:   Lives with:   Living situation:    Reported Patient History: Patient reported health conditions:   Patient aware of HIV and hepatitis status:    In past year, has patient visited ED for any reason?    Number of ED visits:    Reason(s) for visit:    In past year, has patient been hospitalized for any reason?    Number of hospitalizations:    Reason(s) for hospitalization:    In past year, has patient been arrested?    Number of arrests:    Reason(s) for arrest:    In past year, has patient been incarcerated?    Number of incarcerations:    Reason(s) for incarceration:    In past year, has patient received medication-assisted treatment?    In past year, patient received the following treatments:    In past year, has patient received any harm reduction services?    Did this include any of the following?    In past year, has patient received care from a mental health  provider for diagnosis other than SUD?    In past year, is this first time patient has overdosed?    Number of past overdoses:    In past year, is this first time patient has been hospitalized for an overdose?    Number of hospitalizations for overdose(s):    Is patient currently receiving treatment for a mental health diagnosis?    Patient reports experiencing difficulty participating in SUD treatment:      Most important reason(s) for this difficulty?    Has patient received prior services for treatment?    In past, patient has received services from following agencies:    Plan of Care:  Suggested follow up at these agencies/treatment centers:    Other information: CPSS Jake [name] and Jake [name] talked with Pt and discussed the importance of Pt following up with the plan of action for treatment. CPSS Jake [name] discussed a few services that maybe suitable for Pt at this time.    Jake [name], CPSS  10/21/2016 11:51 AM

## 2016-10-21 NOTE — BH Assessment (Signed)
BHH Assessment Progress Note  Per Thedore Mins, MD, this pt does not require psychiatric hospitalization at this time.  Pt is to be discharged from [facility] with recommendation to follow up with [facility].  This has been included in pt's discharge instructions.  Pt would also benefit from seeing Peer Support Specialists; they will be asked speak to to.  Pt's nurse, Donnal Debar, has been notified.  Doylene Canning, MA Triage Specialist [phone]

## 2016-10-21 NOTE — ED Notes (Signed)
TTS equipment to room.  TTS assessment in progress. 

## 2016-10-21 NOTE — Discharge Instructions (Signed)
To help you maintain a sober lifestyle, a substance abuse treatment program may be beneficial to you.  Contact [facility] at your earliest opportunity to ask about enrolling in their program: ° °     [facility] ([facility] °     [address] °     [place], [place] [address] °     [phone] °     New patients are seen at the walk-in clinic every Tuesday from 9:00 am - 12:00 pm °
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
## 2016-10-21 NOTE — Care Management Note (Signed)
Case Management Note  CM noted pt listed his address as the [facility].  Placed PCP information on AVS for pt to follow up with Lavinia Sharps, NP at the [facility] to establish PCP care.

## 2016-10-21 NOTE — BHH Suicide Risk Assessment (Signed)
Suicide Risk Assessment  Discharge Assessment   [facility] Discharge Suicide Risk Assessment   Principal Problem: Alcohol dependence with alcohol-induced mood disorder [facility]) Discharge Diagnoses:  Patient Active Problem List   Diagnosis Date Noted  . Severe major depression (HCC) [F32.2] 09/27/2016  . MDD (major depressive disorder), recurrent severe, without psychosis (HCC) [F33.2] 08/27/2016  . Acute bronchitis [J20.9] 06/03/2016  . Homeless [Z59.0] 06/03/2016  . Alcohol withdrawal (HCC) [F10.239] 06/02/2016  . Tobacco abuse [Z72.0] 06/02/2016  . Protein-calorie malnutrition, severe (HCC) [E43] 07/10/2015  . Transaminitis [R74.0] 07/07/2015  . Hypokalemia [E87.6] 07/07/2015  . Alcohol withdrawal delirium, acute, mixed level of activity (HCC) [F10.231] 07/07/2015  . 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 Alcohol use with alcohol-induced mood disorder (HCC) [F10.94] 04/06/2015  . 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 Polysubstance abuse (HCC) [F19.10] 03/09/2014  . Opioid dependence (HCC) [F11.20] 03/09/2014  . Alcohol use disorder, severe, dependence (HCC) [F10.20] 03/08/2014  . Substance induced mood disorder (HCC) [F19.94] 03/08/2014  . Alcohol abuse [F10.10] 06/28/2013  . Alcohol withdrawal syndrome without complication (HCC) [F10.230] 06/28/2013  . DTs (delirium tremens) (HCC) [F10.231] 06/28/2013  . Marijuana abuse [F12.10] 04/28/2011  . Alcoholism (HCC) [F10.20] 04/27/2011  . Toxic encephalopathy [G92] 04/27/2011  . Psychosis (HCC) [F29] 04/27/2011  . Leukocytosis [D72.829] 04/27/2011  . Hyponatremia [E87.1] 04/27/2011  . Thrombocytopenia (HCC) [D69.6] 04/27/2011  . Elevated AST (SGOT) [R74.0] 04/27/2011  . Elevated bilirubin [R17] 04/27/2011   Delray Alt was admitted to the [facility], voluntarily, with intoxication. BAL 302 on admission. Pt was discharged on 10/20/2016 with follow up  instructions for [facility]. Pt is psychiatrically  Clear for discharge.   Total Time spent with patient: 45 minutes  Musculoskeletal: Strength & Muscle Tone: within normal limits Gait & Station: normal Patient leans: N/A  Psychiatric Specialty Exam:   Blood pressure 128/77, pulse 73, temperature 98.1 F (36.7 C), temperature source Oral, resp. rate 18, height  (1.854 m), weight 61.2 kg (135 lb), SpO2 93 %.Body mass index is 17.81 kg/m.  General Appearance: Disheveled  Eye Contact::  Good  Speech:  Clear and Coherent and Normal Rate409  Volume:  Increased  Mood:  Anxious, Depressed and Irritable  Affect:  Congruent and Depressed  Thought Process:  Coherent, Goal Directed and Linear  Orientation:  Full (Time, Place, and Person)  Thought Content:  Logical  Suicidal Thoughts:  No  Homicidal Thoughts:  No  Memory:  Immediate;   Good Recent;   Good Remote;   Fair  Judgement:  Fair  Insight:  Lacking  Psychomotor Activity:  Normal  Concentration:  Good  Recall:  Good  Fund of Knowledge:Good  Language: Good  Akathisia:  No  Handed:  Right  AIMS (if indicated):     Assets:  Architect Housing Resilience Social Support  Sleep:     Cognition: WNL  ADL's:  Intact   Mental Status Per Nursing Assessment::   On Admission:     Demographic Factors:  Male, Caucasian and Low socioeconomic status  Loss Factors: Financial problems/change in socioeconomic status  Historical Factors: Impulsivity  Risk Reduction Factors:   Sense of responsibility to family and Living with another person, especially a relative  Continued Clinical Symptoms:  Depression:   Hopelessness Impulsivity Alcohol/Substance Abuse/Dependencies More than one psychiatric diagnosis  Cognitive Features That Contribute To Risk:  Closed-mindedness    Suicide Risk:  Minimal: No identifiable suicidal ideation.  Patients presenting with no risk  factors but with morbid ruminations; may be classified as minimal risk based on the severity of the depressive symptoms   Plan Of Care/Follow-up recommendations:  Activity:  as tolerated Diet:  Heart Healthy  Laveda Abbe, NP 10/21/2016, 12:16 PM
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
## 2016-11-14 ENCOUNTER — Emergency Department (HOSPITAL_COMMUNITY)
Admission: EM | Admit: 2016-11-14 | Discharge: 2016-11-14 | Disposition: A | Discharge status: Home / Self Care | Payer: Self-pay | Attending: Emergency Medicine | Admitting: Emergency Medicine

## 2016-11-14 ENCOUNTER — Encounter (HOSPITAL_COMMUNITY): Payer: Self-pay | Admitting: Emergency Medicine

## 2016-11-14 DIAGNOSIS — F419 Anxiety disorder, unspecified: Secondary | ICD-10-CM | POA: Insufficient documentation

## 2016-11-14 DIAGNOSIS — F329 Major depressive disorder, single episode, unspecified: Secondary | ICD-10-CM | POA: Insufficient documentation

## 2016-11-14 DIAGNOSIS — F1010 Alcohol abuse, uncomplicated: Secondary | ICD-10-CM | POA: Insufficient documentation

## 2016-11-14 DIAGNOSIS — F17210 Nicotine dependence, cigarettes, uncomplicated: Secondary | ICD-10-CM | POA: Insufficient documentation

## 2016-11-14 MED ORDER — CHLORDIAZEPOXIDE HCL 25 MG PO CAPS: ORAL_CAPSULE | ORAL | 0 refills | Status: AC

## 2016-11-14 MED ORDER — CHLORDIAZEPOXIDE HCL 25 MG PO CAPS
25.0000 mg | ORAL_CAPSULE | Freq: Once | ORAL | Status: AC
  Administered 2016-11-14: 25 mg via ORAL
  Filled 2016-11-14: qty 1

## 2016-11-14 NOTE — Discharge Instructions (Signed)
Take the prescribed medication as directed. Follow-up with one of the detox facilities attached on back. Return to the ED for new or worsening symptoms.

## 2016-11-14 NOTE — ED Triage Notes (Signed)
Patient states his last drink was 6 hours ago. Patient states he wants to quit drinking. Patient states he has the shakes.

## 2016-11-14 NOTE — ED Provider Notes (Signed)
[facility]-EMERGENCY DEPT Provider Note   CSN: [id number] Arrival date & time: 11/14/16  0103     History   Chief Complaint Chief Complaint  Patient presents with  . Alcohol Intoxication    HPI ANTHON HARPOLE is a 46 y.o. male.  The history is provided by the patient and medical records.  Alcohol Intoxication     46 year old male with history of anxiety, depression, alcohol abuse, withdrawal seizures, presenting to the ED requesting detox.  Reports he has been drinking heavily on a daily basis for several months now.  States his last drink was around noon yesterday.  States he has felt "shaky" since then.  He has not had any seizures.  He has not had any nausea or vomiting.  Has been able to eat and drink.  He denies any illicit drug use.  He denies any suicidal or homicidal ideation.  No hallucinations.  States he has completed detox in the past, but reports he is very serious about getting sober this time.  Past Medical History:  Diagnosis Date  . Anxiety   . Depression   . Elevated liver enzymes   . ETOH abuse   . Hip dislocation   . Seizures (HCC)    ETOH withdrawl    Patient Active Problem List   Diagnosis Date Noted  . Severe major depression (HCC) 09/27/2016  . MDD (major depressive disorder), recurrent severe, without psychosis (HCC) 08/27/2016  . Acute bronchitis 06/03/2016  . Homeless 06/03/2016  . Alcohol withdrawal (HCC) 06/02/2016  . Tobacco abuse 06/02/2016  . Protein-calorie malnutrition, severe (HCC) 07/10/2015  . Transaminitis 07/07/2015  . Hypokalemia 07/07/2015  . Alcohol withdrawal delirium, acute, mixed level of activity (HCC) 07/07/2015  . Severe recurrent major depression without psychotic features (HCC) 04/10/2015  . Alcohol use with alcohol-induced mood disorder (HCC) 04/06/2015  . Alcohol dependence with alcohol-induced mood disorder (HCC) 03/09/2014  . Polysubstance abuse (HCC) 03/09/2014  . Opioid dependence (HCC)  03/09/2014  . Alcohol use disorder, severe, dependence (HCC) 03/08/2014  . Substance induced mood disorder (HCC) 03/08/2014  . Alcohol abuse 06/28/2013  . Alcohol withdrawal syndrome without complication (HCC) 06/28/2013  . DTs (delirium tremens) (HCC) 06/28/2013  . Marijuana abuse 04/28/2011  . Alcoholism (HCC) 04/27/2011  . Toxic encephalopathy 04/27/2011  . Psychosis (HCC) 04/27/2011  . Leukocytosis 04/27/2011  . Hyponatremia 04/27/2011  . Thrombocytopenia (HCC) 04/27/2011  . Elevated AST (SGOT) 04/27/2011  . Elevated bilirubin 04/27/2011    Past Surgical History:  Procedure Laterality Date  . FACIAL FRACTURE SURGERY         Home Medications    Prior to Admission medications   Medication Sig Start Date End Date Taking? Authorizing Provider  gabapentin (NEURONTIN) 300 MG capsule Take 1 capsule (300 mg total) by mouth 3 (three) times daily. Patient not taking: Reported on 11/14/2016 10/17/16   Charm Rings, NP  hydrOXYzine (ATARAX/VISTARIL) 25 MG tablet Take 1 tablet (25 mg total) by mouth 3 (three) times daily as needed for anxiety. Patient not taking: Reported on 10/10/2016 10/02/16   Money, Gerlene Burdock, FNP  naltrexone (DEPADE) 50 MG tablet Take 1 tablet (50 mg total) by mouth daily. For alcoholism Patient not taking: Reported on 10/10/2016 10/03/16   Money, Gerlene Burdock, FNP  traZODone (DESYREL) 50 MG tablet Take 1 tablet (50 mg total) by mouth at bedtime. Patient not taking: Reported on 11/14/2016 10/02/16   Money, Gerlene Burdock, FNP    Family History Family History  Problem Relation  Age of Onset  . Heart attack Father     Social History Social History  Substance Use Topics  . Smoking status: Current Every Day Smoker    Packs/day: 2.00    Types: Cigarettes  . Smokeless tobacco: Never Used  . Alcohol use 4.8 oz/week    3 Cans of beer, 5 Shots of liquor per week     Comment: daily     Allergies   Patient has no known allergies.   Review of Systems Review of Systems    Psychiatric/Behavioral:       Wants detox  All other systems reviewed and are negative.    Physical Exam Updated Vital Signs BP 109/84   Pulse 79   Temp 97.9 F (36.6 C) (Oral)   Resp 20   Ht 6\' 1"  (1.854 m)   Wt 59 kg (130 lb)   SpO2 99%   BMI 17.15 kg/m   Physical Exam  Constitutional: He is oriented to person, place, and time. He appears well-developed and well-nourished.  Disheveled, breath smells of EtOH  HENT:  Head: Normocephalic and atraumatic.  Mouth/Throat: Oropharynx is clear and moist.  Eyes: Pupils are equal, round, and reactive to light. Conjunctivae and EOM are normal.  Neck: Normal range of motion.  Cardiovascular: Normal rate, regular rhythm and normal heart sounds.   Pulmonary/Chest: Effort normal and breath sounds normal. No respiratory distress. He has no wheezes.  Abdominal: Soft. Bowel sounds are normal. There is no tenderness. There is no rebound.  Musculoskeletal: Normal range of motion.  Neurological: He is alert and oriented to person, place, and time.  AAOx3, ambulatory with steady gait, moving extremities well, speech is clear and goal oriented; there are no resting or intention tremors noted, no seizure activity  Skin: Skin is warm and dry.  Psychiatric: He has a normal mood and affect. He is not actively hallucinating. He expresses no homicidal and no suicidal ideation. He expresses no suicidal plans and no homicidal plans.  Nursing note and vitals reviewed.    ED Treatments / Results  Labs (all labs ordered are listed, but only abnormal results are displayed) Labs Reviewed - No data to display  EKG  EKG Interpretation None       Radiology No results found.  Procedures Procedures (including critical care time)  Medications Ordered in ED Medications - No data to display   Initial Impression / Assessment and Plan / ED Course  I have reviewed the triage vital signs and the nursing notes.  Pertinent labs & imaging results  that were available during my care of the patient were reviewed by me and considered in my medical decision making (see chart for details).  46 year old male here requesting alcohol detox.  Has been drinking heavily for several months now.  Last drink was around noon yesterday.  Reports he is starting to feel "shaky".  Patient is alert and oriented x3.  He has no focal neurologic deficits.  He is ambulatory with steady gait.  He has no resting or intention tremor.  He does not appear to be clinically withdrawing at this time.  Denies SI/HI/AVH.  Tolerating fluids well here.  VSS.  Appears stable for OP detox.  Will give resource guide, start librium taper.  Discussed plan with patient, he acknowledged understanding and agreed with plan of care.  Return precautions given for new or worsening symptoms.  Final Clinical Impressions(s) / ED Diagnoses   Final diagnoses:  ETOH abuse    New Prescriptions  New Prescriptions   CHLORDIAZEPOXIDE (LIBRIUM) 25 MG CAPSULE    50mg  PO TID x 1D, then 25-50mg  PO BID X 1D, then 25-50mg  PO QD X 1D     Garlon HatchetSanders, Ferlin Fairhurst M, PA-C 11/14/16 0341    Garlon HatchetSanders, Keary Waterson M, PA-C 11/14/16 0344    Dione BoozeGlick, David, MD 11/14/16 2238

## 2016-11-14 NOTE — ED Notes (Signed)
Bed: WTR7 Expected date:  Expected time:  Means of arrival:  Comments: 

## 2016-11-26 ENCOUNTER — Telehealth (HOSPITAL_COMMUNITY): Payer: Self-pay

## 2017-02-03 IMAGING — CR DG SHOULDER 2+V*L*
3 series · 3 of 3 positions shown · non-contrast
Comparison: None.

CLINICAL DATA: 44-year-old male with trauma and left shoulder pain.
S

EXAM:
LEFT SHOULDER - 2+ VIEW

[w shoulder external left]
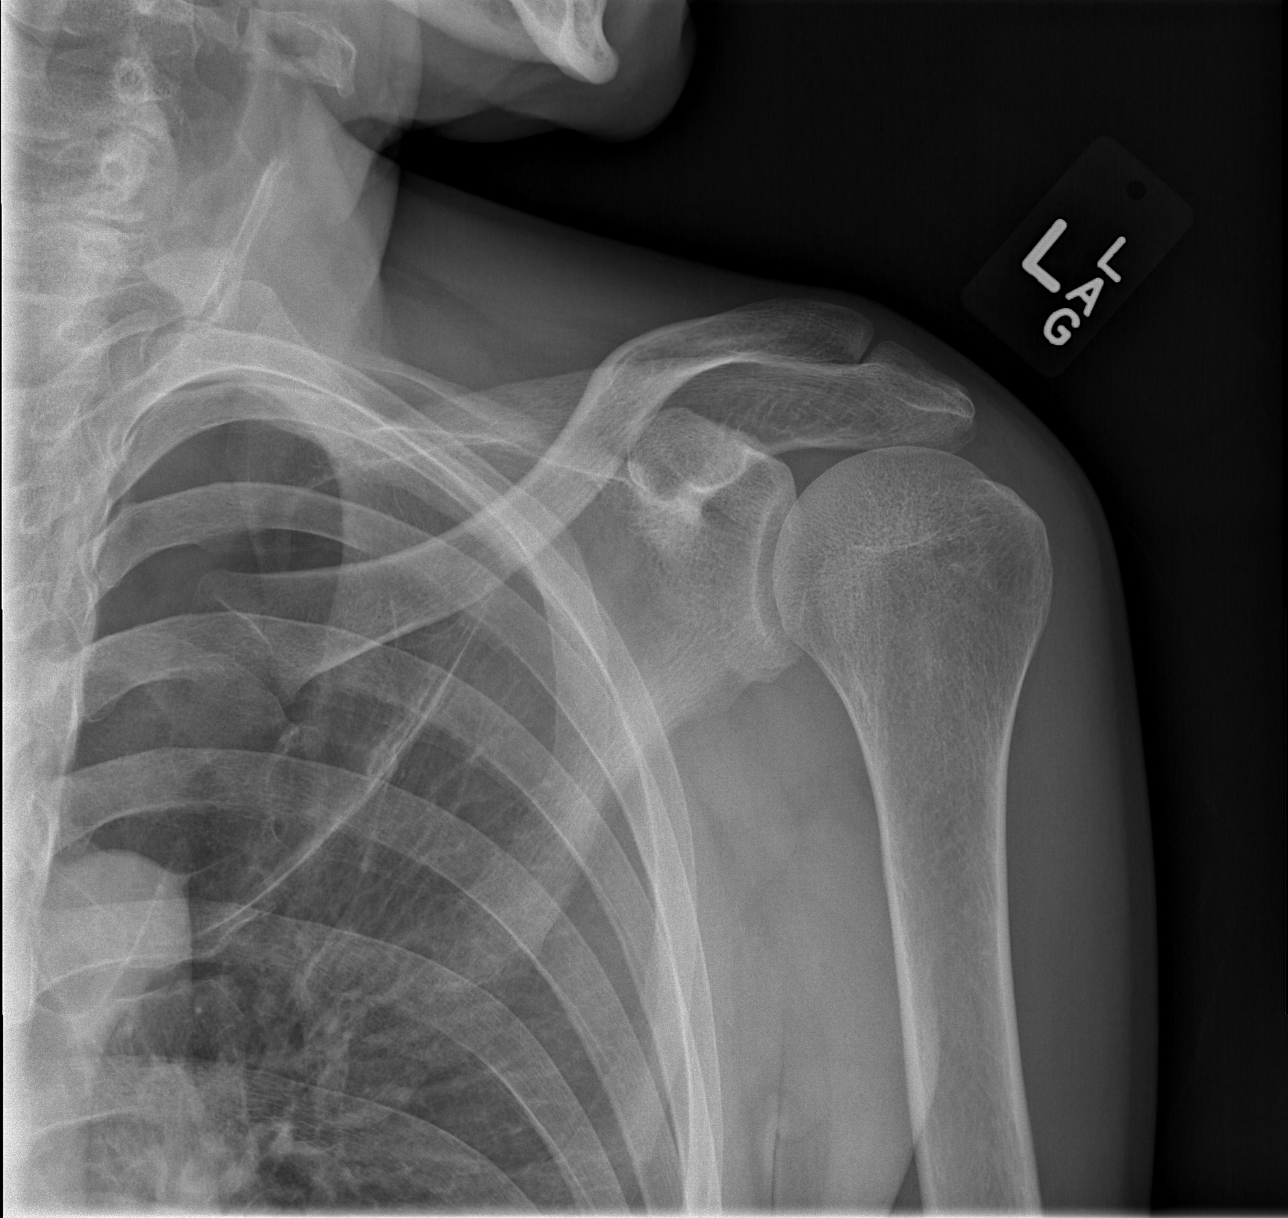

[w shoulder y-view left]
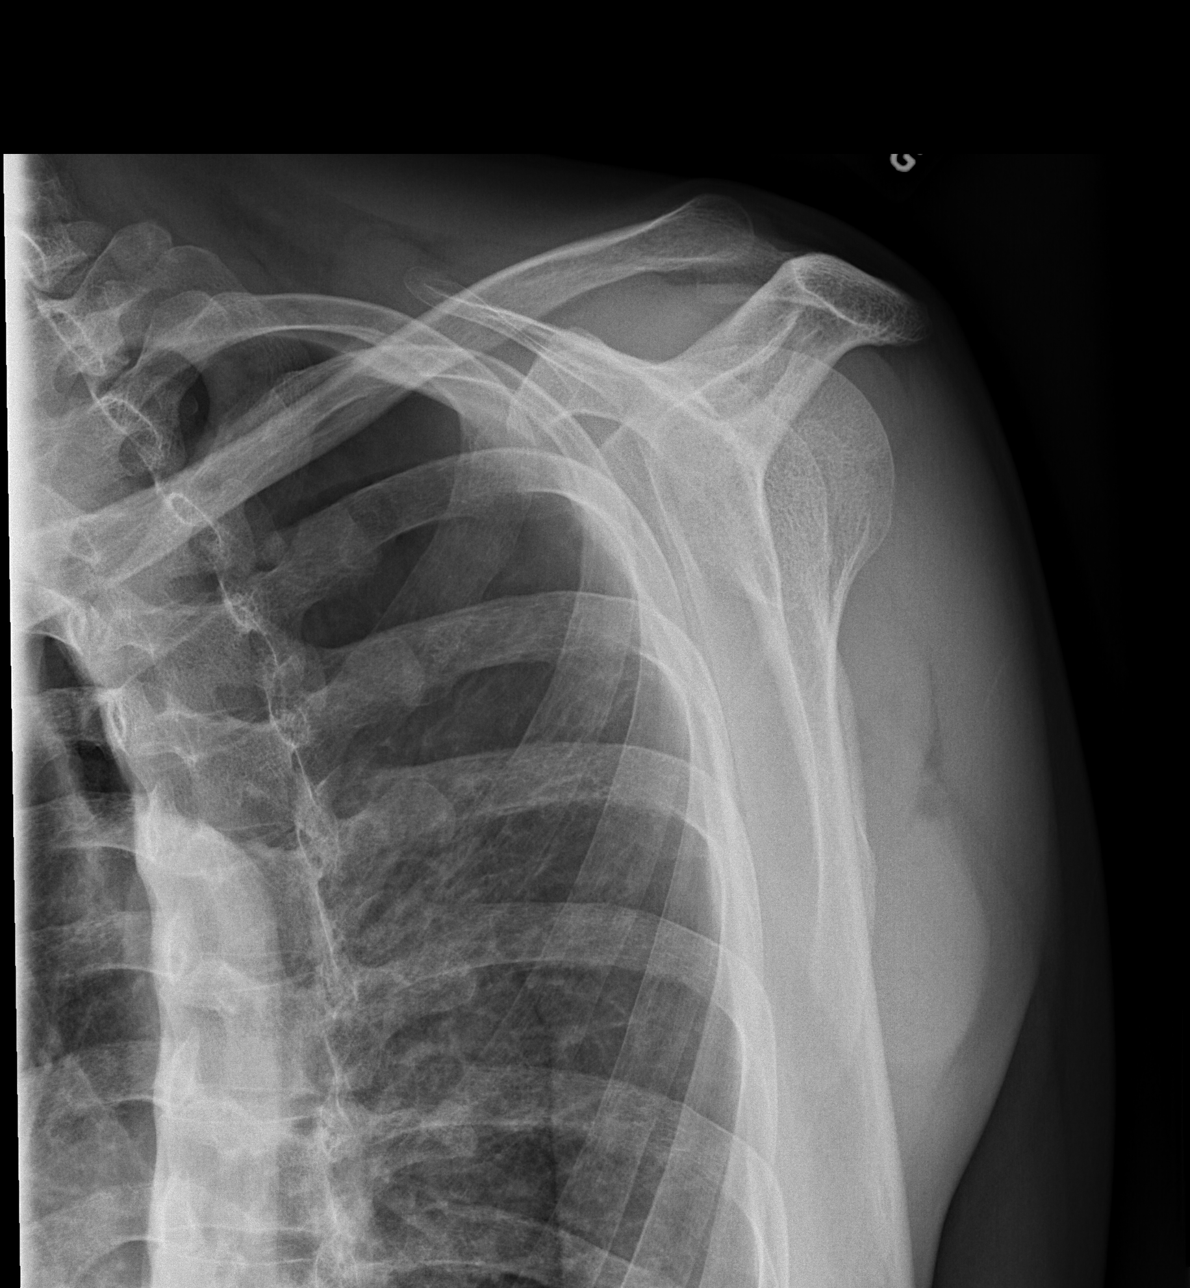

[x shoulder axillary left]
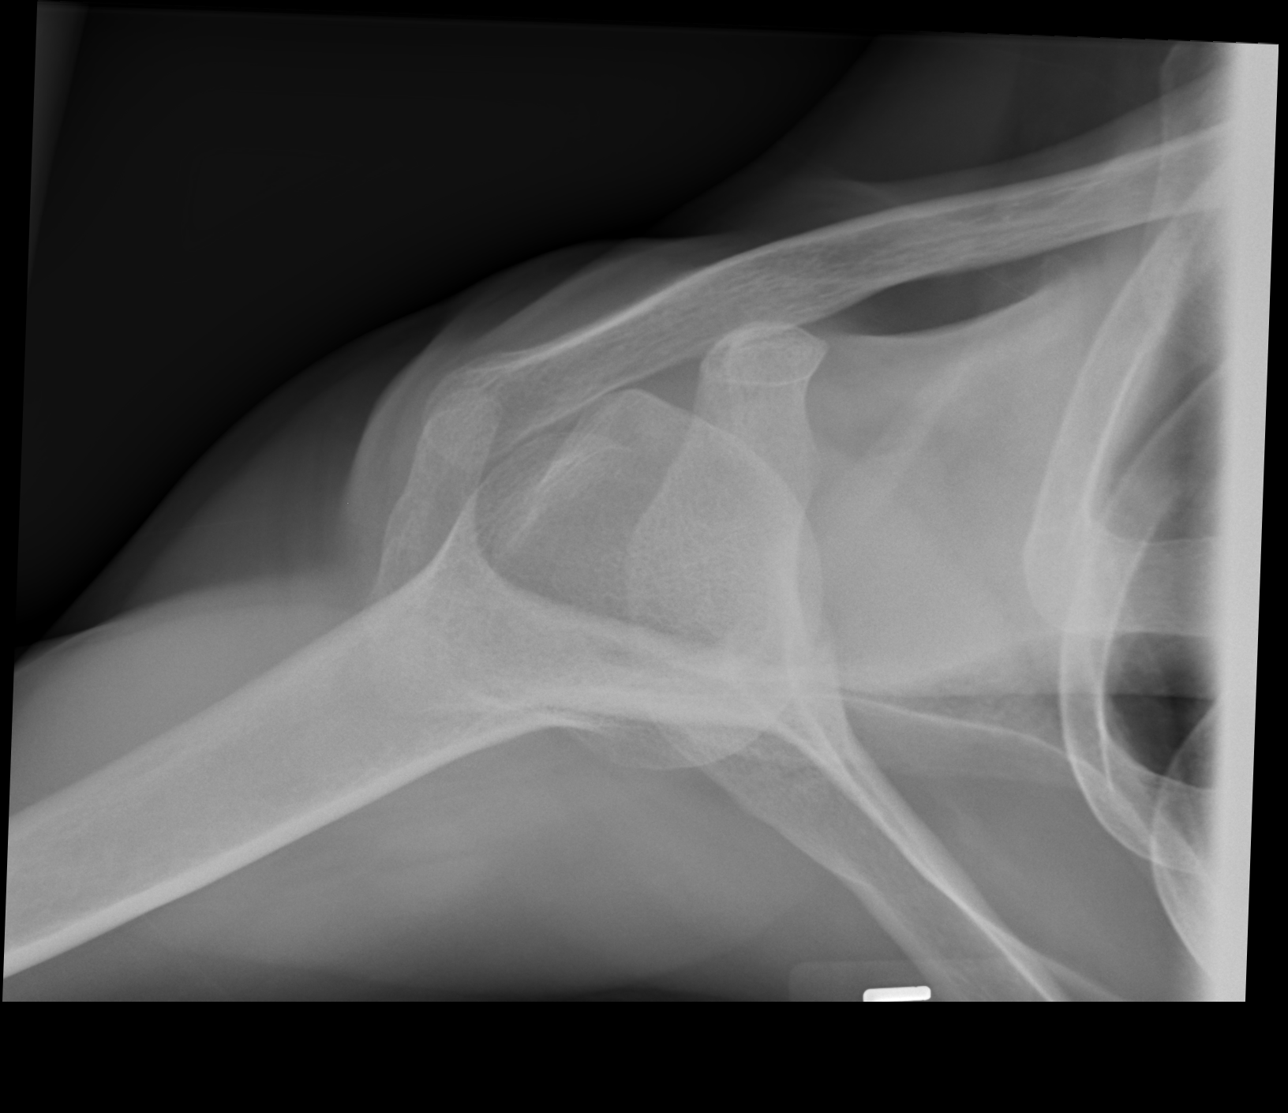

[3 of 3 positions shown; findings below may reference images not displayed]

FINDINGS: There is no evidence of fracture or dislocation. There is no
evidence of arthropathy or other focal bone abnormality. Soft
tissues are unremarkable.
IMPRESSION: Negative.

## 2017-05-06 IMAGING — CR DG CHEST 2V
2 series · 2 of 2 positions shown · non-contrast
Comparison: 10/24/2013

CLINICAL DATA: Unresponsive patient.

EXAM:
CHEST  2 VIEW

[w chest pa]
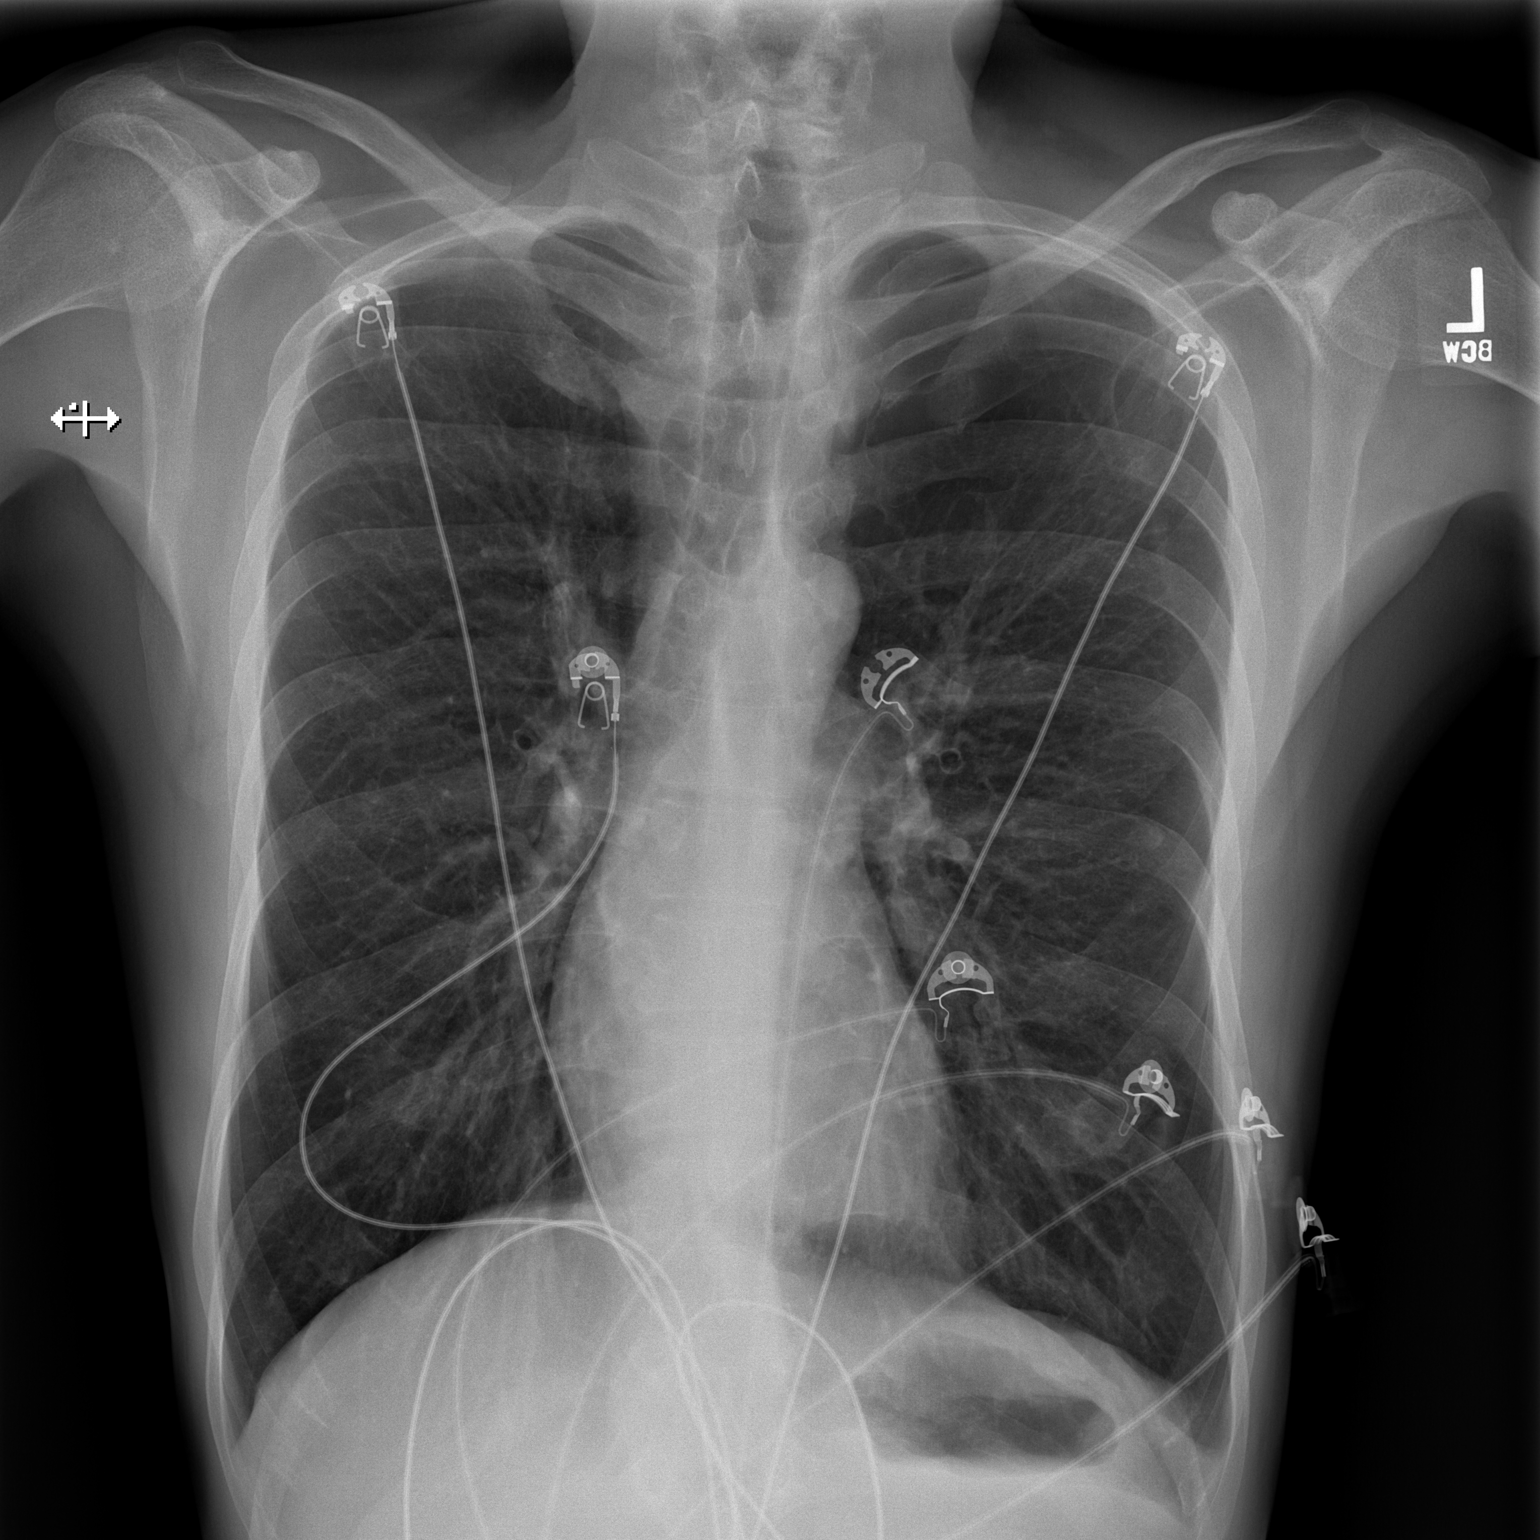

[w chest lat]
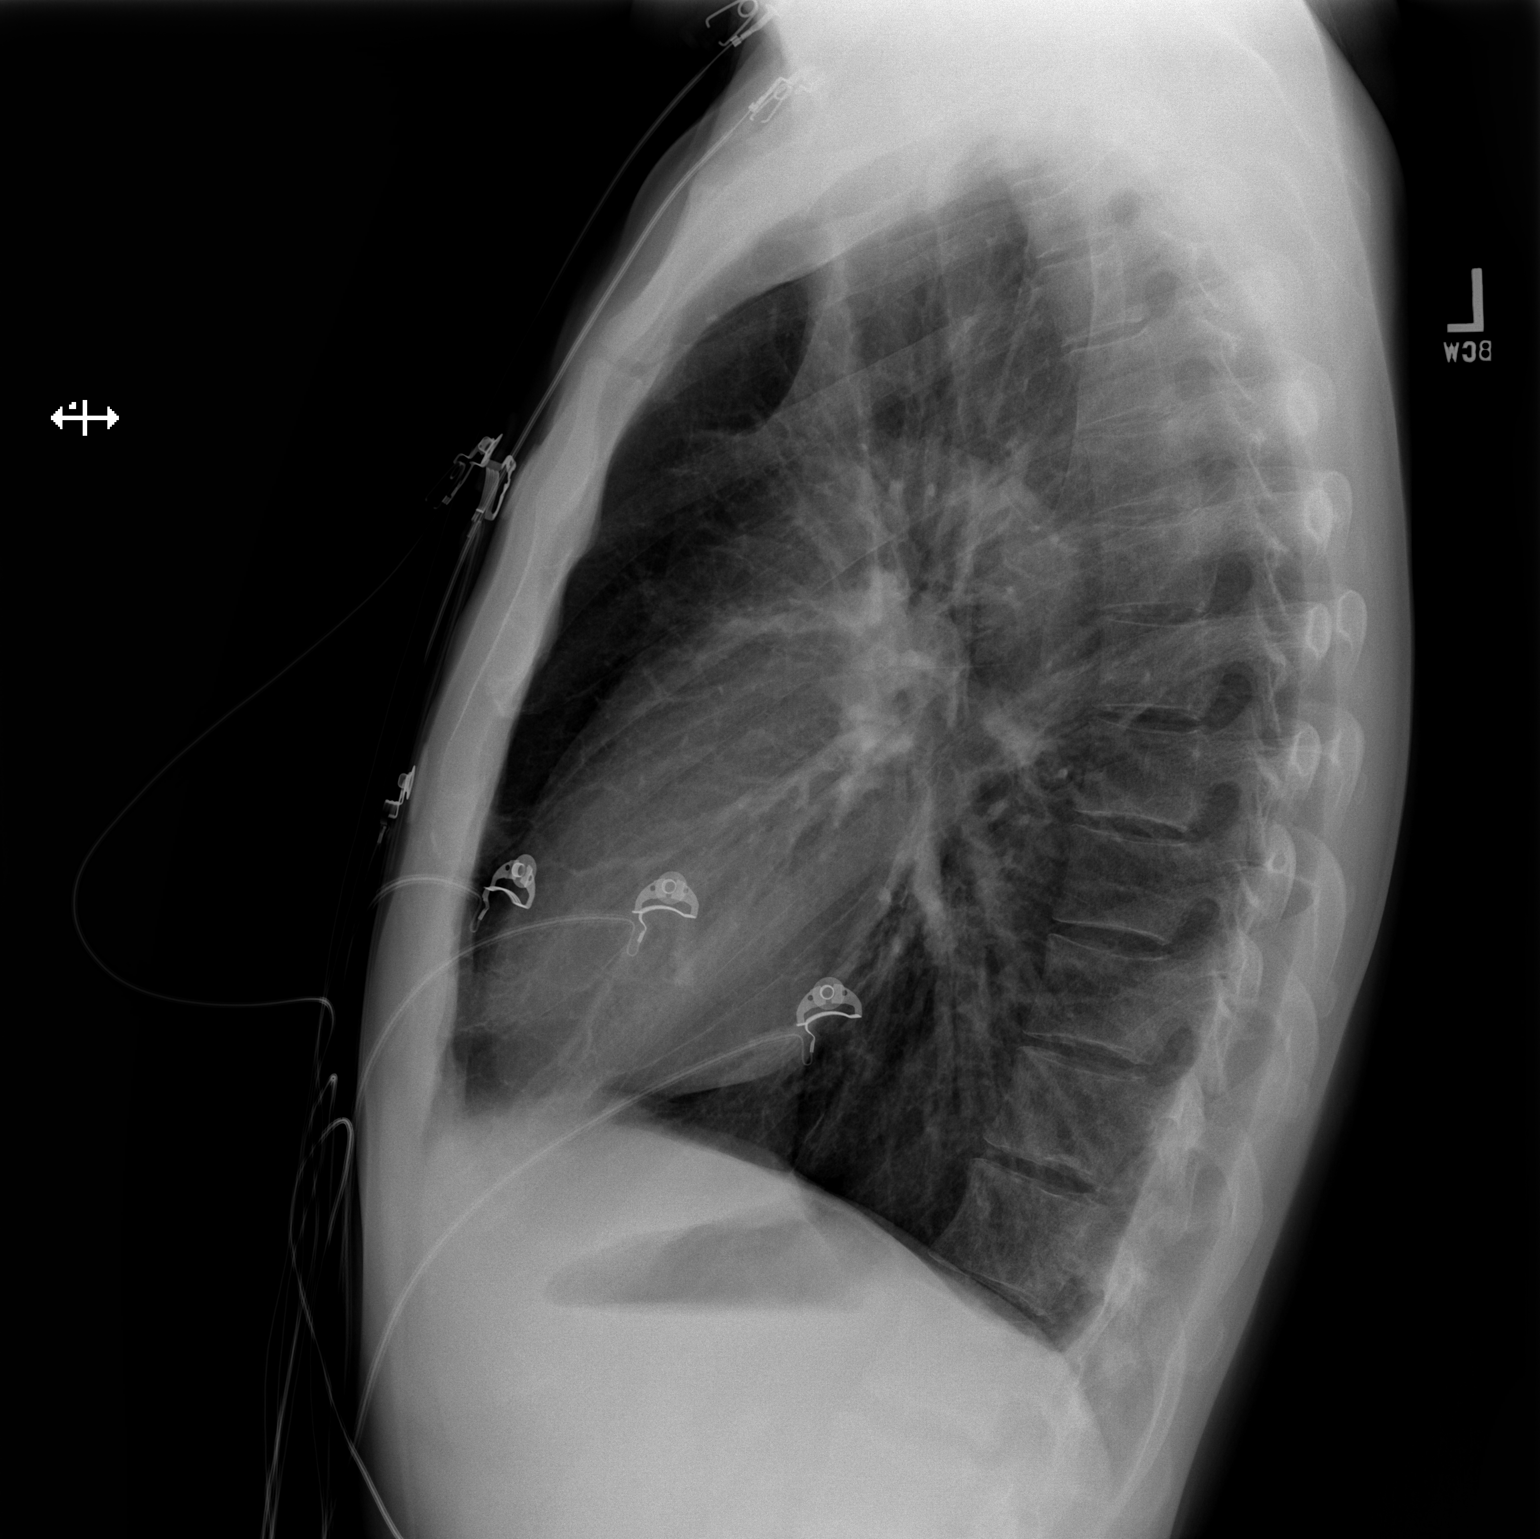

[2 of 2 positions shown; findings below may reference images not displayed]

FINDINGS: Hyperinflation of the lungs demonstrated by increased retrosternal
air and flattening of the hemidiaphragms. Findings are suggestive
for emphysema. In addition, there is a large amount of lucency in
the left upper lobe. Small nodular densities in the mid lungs
probably related to nipple shadows. There is no focal airspace
disease or pulmonary edema. Heart and mediastinum are within normal
limits. The trachea is midline. No acute bone abnormality. No large
pleural effusions.
IMPRESSION: Emphysematous changes.

No acute chest findings.

Probable bilateral nipple shadows. This could be confirmed with
nipple markers.

## 2020-09-13 DEATH — deceased
